# Patient Record
Sex: Female | Born: 1970
Health system: Southern US, Community
[De-identification: ages and names within clinical notes are randomized; demographics above are authoritative.]

## PROBLEM LIST (undated history)

## (undated) DIAGNOSIS — J45909 Unspecified asthma, uncomplicated: Secondary | ICD-10-CM

## (undated) DIAGNOSIS — K219 Gastro-esophageal reflux disease without esophagitis: Secondary | ICD-10-CM

## (undated) DIAGNOSIS — G2581 Restless legs syndrome: Secondary | ICD-10-CM

## (undated) DIAGNOSIS — G43909 Migraine, unspecified, not intractable, without status migrainosus: Secondary | ICD-10-CM

## (undated) DIAGNOSIS — F4310 Post-traumatic stress disorder, unspecified: Secondary | ICD-10-CM

## (undated) DIAGNOSIS — G629 Polyneuropathy, unspecified: Secondary | ICD-10-CM

## (undated) DIAGNOSIS — K589 Irritable bowel syndrome without diarrhea: Secondary | ICD-10-CM

## (undated) DIAGNOSIS — M47817 Spondylosis without myelopathy or radiculopathy, lumbosacral region: Secondary | ICD-10-CM

## (undated) DIAGNOSIS — G4710 Hypersomnia, unspecified: Secondary | ICD-10-CM

## (undated) DIAGNOSIS — R112 Nausea with vomiting, unspecified: Secondary | ICD-10-CM

## (undated) DIAGNOSIS — N3010 Interstitial cystitis (chronic) without hematuria: Secondary | ICD-10-CM

## (undated) DIAGNOSIS — E119 Type 2 diabetes mellitus without complications: Secondary | ICD-10-CM

## (undated) DIAGNOSIS — F419 Anxiety disorder, unspecified: Secondary | ICD-10-CM

## (undated) DIAGNOSIS — Z9889 Other specified postprocedural states: Secondary | ICD-10-CM

## (undated) DIAGNOSIS — F429 Obsessive-compulsive disorder, unspecified: Secondary | ICD-10-CM

## (undated) DIAGNOSIS — F32A Depression, unspecified: Secondary | ICD-10-CM

## (undated) DIAGNOSIS — F329 Major depressive disorder, single episode, unspecified: Secondary | ICD-10-CM

## (undated) DIAGNOSIS — M5030 Other cervical disc degeneration, unspecified cervical region: Secondary | ICD-10-CM

## (undated) DIAGNOSIS — M1990 Unspecified osteoarthritis, unspecified site: Secondary | ICD-10-CM

## (undated) DIAGNOSIS — G4730 Sleep apnea, unspecified: Secondary | ICD-10-CM

## (undated) DIAGNOSIS — G5600 Carpal tunnel syndrome, unspecified upper limb: Secondary | ICD-10-CM

## (undated) DIAGNOSIS — M797 Fibromyalgia: Secondary | ICD-10-CM

## (undated) DIAGNOSIS — M549 Dorsalgia, unspecified: Secondary | ICD-10-CM

## (undated) DIAGNOSIS — J449 Chronic obstructive pulmonary disease, unspecified: Secondary | ICD-10-CM

## (undated) HISTORY — PX: MOUTH SURGERY: SHX715

## (undated) HISTORY — DX: Type 2 diabetes mellitus without complications: E11.9

## (undated) HISTORY — PX: ABDOMINAL SURGERY: SHX537

## (undated) HISTORY — PX: OTHER SURGICAL HISTORY: SHX169

## (undated) HISTORY — PX: EXPLORATORY LAPAROTOMY: SUR591

## (undated) HISTORY — PX: ABDOMINAL HYSTERECTOMY: SHX81

---

## 1998-01-26 ENCOUNTER — Ambulatory Visit (HOSPITAL_COMMUNITY): Admission: RE | Admit: 1998-01-26 | Discharge: 1998-01-26 | Payer: Self-pay | Admitting: Unknown Physician Specialty

## 2009-08-10 ENCOUNTER — Emergency Department (HOSPITAL_COMMUNITY): Admission: EM | Admit: 2009-08-10 | Discharge: 2009-08-10 | Payer: Self-pay | Admitting: Emergency Medicine

## 2009-08-19 ENCOUNTER — Emergency Department (HOSPITAL_COMMUNITY): Admission: EM | Admit: 2009-08-19 | Discharge: 2009-08-19 | Payer: Self-pay | Admitting: Emergency Medicine

## 2011-10-11 ENCOUNTER — Ambulatory Visit: Payer: Self-pay | Admitting: Family Medicine

## 2011-10-20 ENCOUNTER — Encounter (HOSPITAL_COMMUNITY): Payer: Self-pay | Admitting: *Deleted

## 2011-10-20 ENCOUNTER — Emergency Department (HOSPITAL_COMMUNITY)
Admission: EM | Admit: 2011-10-20 | Discharge: 2011-10-20 | Disposition: A | Discharge status: Home / Self Care | Payer: Medicare Other | Attending: Emergency Medicine | Admitting: Emergency Medicine

## 2011-10-20 DIAGNOSIS — F17200 Nicotine dependence, unspecified, uncomplicated: Secondary | ICD-10-CM | POA: Insufficient documentation

## 2011-10-20 DIAGNOSIS — Z8739 Personal history of other diseases of the musculoskeletal system and connective tissue: Secondary | ICD-10-CM | POA: Insufficient documentation

## 2011-10-20 DIAGNOSIS — F341 Dysthymic disorder: Secondary | ICD-10-CM | POA: Insufficient documentation

## 2011-10-20 DIAGNOSIS — J449 Chronic obstructive pulmonary disease, unspecified: Secondary | ICD-10-CM | POA: Insufficient documentation

## 2011-10-20 DIAGNOSIS — M7989 Other specified soft tissue disorders: Secondary | ICD-10-CM | POA: Insufficient documentation

## 2011-10-20 DIAGNOSIS — J4489 Other specified chronic obstructive pulmonary disease: Secondary | ICD-10-CM | POA: Insufficient documentation

## 2011-10-20 DIAGNOSIS — IMO0001 Reserved for inherently not codable concepts without codable children: Secondary | ICD-10-CM | POA: Insufficient documentation

## 2011-10-20 HISTORY — DX: Fibromyalgia: M79.7

## 2011-10-20 HISTORY — DX: Obsessive-compulsive disorder, unspecified: F42.9

## 2011-10-20 HISTORY — DX: Unspecified osteoarthritis, unspecified site: M19.90

## 2011-10-20 HISTORY — DX: Post-traumatic stress disorder, unspecified: F43.10

## 2011-10-20 HISTORY — DX: Depression, unspecified: F32.A

## 2011-10-20 HISTORY — DX: Migraine, unspecified, not intractable, without status migrainosus: G43.909

## 2011-10-20 HISTORY — DX: Major depressive disorder, single episode, unspecified: F32.9

## 2011-10-20 HISTORY — DX: Chronic obstructive pulmonary disease, unspecified: J44.9

## 2011-10-20 HISTORY — DX: Unspecified asthma, uncomplicated: J45.909

## 2011-10-20 HISTORY — DX: Interstitial cystitis (chronic) without hematuria: N30.10

## 2011-10-20 HISTORY — DX: Anxiety disorder, unspecified: F41.9

## 2011-10-20 HISTORY — DX: Dorsalgia, unspecified: M54.9

## 2011-10-20 LAB — CBC WITH DIFFERENTIAL/PLATELET
Band Neutrophils: 0 % (ref 0–10)
Basophils Absolute: 0 10*3/uL (ref 0.0–0.1)
Eosinophils Absolute: 0.1 10*3/uL (ref 0.0–0.7)
Eosinophils Relative: 1 % (ref 0–5)
HCT: 37 % (ref 36.0–46.0)
Hemoglobin: 12.9 g/dL (ref 12.0–15.0)
Lymphocytes Relative: 38 % (ref 12–46)
Lymphs Abs: 2.8 10*3/uL (ref 0.7–4.0)
MCH: 32.2 pg (ref 26.0–34.0)
MCHC: 34.9 g/dL (ref 30.0–36.0)
Myelocytes: 0 %
Platelets: 190 10*3/uL (ref 150–400)
RDW: 12.6 % (ref 11.5–15.5)
WBC: 7.4 10*3/uL (ref 4.0–10.5)
nRBC: 0 /100 WBC

## 2011-10-20 LAB — BASIC METABOLIC PANEL
BUN: 12 mg/dL (ref 6–23)
CO2: 27 mEq/L (ref 19–32)
Chloride: 106 mEq/L (ref 96–112)
GFR calc Af Amer: 79 mL/min — ABNORMAL LOW (ref >90)
Sodium: 139 mEq/L (ref 135–145)

## 2011-10-20 MED ORDER — CLINDAMYCIN HCL 150 MG PO CAPS
300.0000 mg | ORAL_CAPSULE | Freq: Once | ORAL | Status: AC
  Administered 2011-10-20: 300 mg via ORAL
  Filled 2011-10-20: qty 2

## 2011-10-20 MED ORDER — ENOXAPARIN SODIUM 100 MG/ML ~~LOC~~ SOLN
1.0000 mg/kg | Freq: Once | SUBCUTANEOUS | Status: AC
  Administered 2011-10-20: 100 mg via SUBCUTANEOUS
  Filled 2011-10-20: qty 1

## 2011-10-20 MED ORDER — CLINDAMYCIN HCL 300 MG PO CAPS: 300.0000 mg | ORAL_CAPSULE | Freq: Four times a day (QID) | ORAL | Status: AC

## 2011-10-20 NOTE — ED Notes (Signed)
Pt c/o left leg swelling that has been going on a while. Pt was recently treated for cellulitis.

## 2011-10-20 NOTE — ED Provider Notes (Signed)
History   This chart was scribed for Alexis Melter, MD by Shari Heritage. The patient was seen in room APA14/APA14. Patient's care was started at 1713.     CSN: [id number]  Arrival date & time 10/20/11  1713   First MD Initiated Contact with Patient 10/20/11 1821      Chief Complaint  Patient presents with  . Leg Swelling    (Consider location/radiation/quality/duration/timing/severity/associated sxs/prior treatment) The history is provided by the patient. No language interpreter was used.   Alexis Harris is a 41 y.o. female who presents to the Emergency Department complaining of diffuse, left leg swelling onset 2 months ago. Associated symptoms include redness and pain in the left lower extremity. Patient says that swelling is worse at the end of the day and that she elevates her leg at the end of every day. Patient states that she has seen a physician for this issue. Patient was referred to [facility] ED on June 13th. Patient had an X-ray, blood work and Doppler of her left leg. Patient was diagnosed with cellulitis and enlarged lymph node in left upper leg. Patient was given antibiotics and was prescribed a course of Clindamycin which patient finished as scheduled in June. Patient states that swelling reduced after course of Clindamycin, but after 2 days, swelling began to gradually return.  Medical h/o fibromyalgia, COPD, asthma, chronic back pain, GERD, depression and anxiety. Patient with surgical h/o abdominal hysterectomy. [name] a current some day smoker.  Past Medical History  Diagnosis Date  . Fibromyalgia   . Asthma   . COPD (chronic obstructive pulmonary disease)   . Back pain   . Arthritis   . Interstitial cystitis   . Depression   . Anxiety   . PTSD (post-traumatic stress disorder)   . OCD (obsessive compulsive disorder)   . Migraine     Past Surgical History  Procedure Date  . Abdominal surgery   . Abdominal hysterectomy     History  reviewed. No pertinent family history.  History  Substance Use Topics  . Smoking status: Current Some Day Smoker  . Smokeless tobacco: Not on file  . Alcohol Use: No    OB History    Grav Para Term Preterm Abortions TAB SAB Ect Mult Living                  Review of Systems  Musculoskeletal:       Positive for left leg pain, left leg swelling.  Skin: Positive for color change (Redness of left lower extremity).  All other systems reviewed and are negative.    Allergies  Advair diskus; Codeine; and Penicillins  Home Medications    BP 127/79  Pulse 108  Temp 98.5 F (36.9 C) (Oral)  Resp 21  Ht 5\' 7"  (1.702 m)  Wt 224 lb (101.606 kg)  BMI 35.08 kg/m2  SpO2 100%  Physical Exam  Nursing note and vitals reviewed. Constitutional: She is oriented to person, place, and time. She appears well-developed and well-nourished. No distress.  HENT:  Head: Normocephalic and atraumatic.  Eyes: Conjunctivae and EOM are normal.  Neck: Neck supple. No tracheal deviation present.  Cardiovascular: Normal rate and regular rhythm.   Pulmonary/Chest: Effort normal. No respiratory distress. She has no wheezes. She has no rhonchi.  Abdominal: She exhibits no distension.  Musculoskeletal: Normal range of motion.       Left calf tenderness. No popliteal tenderness. Left leg is diffusely swollen. Patient experiencing pain  in lower left leg. Left leg is mildly red, not clearly demarcated. There are scattered excoriations on both lower extremities. No discharge, fluctuance or vesicles.   Neurological: She is alert and oriented to person, place, and time. No sensory deficit.  Skin: Skin is dry.  Psychiatric: She has a normal mood and affect. Her behavior is normal.    ED Course  Procedures (including critical care time) DIAGNOSTIC STUDIES: Oxygen Saturation is 100% on room air, normal by my interpretation.    COORDINATION OF CARE: 6:43PM- Patient informed of current plan for treatment and  evaluation and agrees with plan at this time. Will order CBC and metabolic panel. Will administer Clindamycin. If labs are normal, will administer Lovenox and discharge patient.  8:31PM- Updated patient on results of labs. Labs were unremarkable. Will administer Lovenox shot and prescribe a course of antibiotics. Patient should return in the morning for an [place]. Will also refer patient to a vein specialist and provide phone numbers for PCP.   Results for orders placed during the hospital encounter of 10/20/11  BASIC METABOLIC PANEL      Component Value Range   Sodium 139  135 - 145 mEq/L   Potassium 3.7  3.5 - 5.1 mEq/L   Chloride 106  96 - 112 mEq/L   CO2 27  19 - 32 mEq/L   Glucose, Bld 104 (*) 70 - 99 mg/dL   BUN 12  6 - 23 mg/dL   Creatinine, Ser 4.09  0.50 - 1.10 mg/dL   Calcium 9.4  8.4 - 81.1 mg/dL   GFR calc non Af Amer 68 (*) >90 mL/min   GFR calc Af Amer 79 (*) >90 mL/min  CBC WITH DIFFERENTIAL      Component Value Range   WBC 7.4  4.0 - 10.5 K/uL   RBC 4.01  3.87 - 5.11 MIL/uL   Hemoglobin 12.9  12.0 - 15.0 g/dL   HCT 91.4  78.2 - 95.6 %   MCV 92.3  78.0 - 100.0 fL   MCH 32.2  26.0 - 34.0 pg   MCHC 34.9  30.0 - 36.0 g/dL   RDW 21.3  08.6 - 57.8 %   Platelets 190  150 - 400 K/uL   Neutrophils Relative 59  43 - 77 %   Lymphocytes Relative 38  12 - 46 %   Monocytes Relative 2 (*) 3 - 12 %   Eosinophils Relative 1  0 - 5 %   Basophils Relative 0  0 - 1 %   Band Neutrophils 0  0 - 10 %   Metamyelocytes Relative 0     Myelocytes 0     Promyelocytes Absolute 0     Blasts 0     nRBC 0  0 /100 WBC   Neutro Abs 4.4  1.7 - 7.7 K/uL   Lymphs Abs 2.8  0.7 - 4.0 K/uL   Monocytes Absolute 0.1  0.1 - 1.0 K/uL   Eosinophils Absolute 0.1  0.0 - 0.7 K/uL   Basophils Absolute 0.0  0.0 - 0.1 K/uL  PROTIME-INR      Component Value Range   Prothrombin Time 14.7  11.6 - 15.2 seconds   INR 1.13  0.00 - 1.49   No results found.   1. Leg swelling       MDM  Nonspecific  recurrent leg swelling, with differential diagnosis including DVT and cellulitis. Doubt metabolic instability, serious bacterial infection or impending vascular collapse; the patient is stable for discharge.  Plan: Home Medications- Clindamycin; Home Treatments- Elevate feet; Recommended follow up- Vascular f/u if negative DVT for persistant swelling  I personally performed the services described in this documentation, which was scribed in my presence. The recorded information has been reviewed and considered.     Alexis Melter, MD 10/20/11 [phone]

## 2011-10-20 NOTE — ED Notes (Signed)
Swelling of lt  Leg for 2 mos, dx with cellulitis , says was getting better, then became worse.   Swelling and redness present

## 2011-10-21 ENCOUNTER — Ambulatory Visit (HOSPITAL_COMMUNITY)
Admit: 2011-10-21 | Discharge: 2011-10-21 | Disposition: A | Discharge status: Home / Self Care | Payer: Medicare Other | Source: Ambulatory Visit | Attending: Emergency Medicine | Admitting: Emergency Medicine

## 2011-10-21 DIAGNOSIS — R609 Edema, unspecified: Secondary | ICD-10-CM | POA: Insufficient documentation

## 2011-10-21 DIAGNOSIS — M79609 Pain in unspecified limb: Secondary | ICD-10-CM | POA: Insufficient documentation

## 2014-11-02 NOTE — Patient Instructions (Signed)
Your procedure is scheduled on: 11/10/2014  Report to [facility] at  800  AM.  Call this number if you have problems the morning of surgery: [phone]   Do not eat food or drink liquids :After Midnight.      Take these medicines the morning of surgery with A SIP OF WATER: xanax, wellbutrin, flexaril, enablex, luvox, hydrocodone, morphine, lyrica, imitrex, topamax. Take your inhalers before you come.   Do not wear jewelry, make-up or nail polish.  Do not wear lotions, powders, or perfumes.  Do not shave 48 hours prior to surgery.  Do not bring valuables to the hospital.  Contacts, dentures or bridgework may not be worn into surgery.  Leave suitcase in the car. After surgery it may be brought to your room.  For patients admitted to the hospital, checkout time is 11:00 AM the day of discharge.   Patients discharged the day of surgery will not be allowed to drive home.  :     Please read over the following fact sheets that you were given: Coughing and Deep Breathing, Surgical Site Infection Prevention, Anesthesia Post-op Instructions and Care and Recovery After Surgery    Cataract A cataract is a clouding of the lens of the eye. When a lens becomes cloudy, vision is reduced based on the degree and nature of the clouding. Many cataracts reduce vision to some degree. Some cataracts make people more near-sighted as they develop. Other cataracts increase glare. Cataracts that are ignored and become worse can sometimes look white. The white color can be seen through the pupil. CAUSES   Aging. However, cataracts may occur at any age, even in newborns.   Certain drugs.   Trauma to the eye.   Certain diseases such as diabetes.   Specific eye diseases such as chronic inflammation inside the eye or a sudden attack of a rare form of glaucoma.   Inherited or acquired medical problems.  SYMPTOMS   Gradual, progressive drop in vision in the affected eye.   Severe, rapid visual loss. This most  often happens when trauma is the cause.  DIAGNOSIS  To detect a cataract, an eye doctor examines the lens. Cataracts are best diagnosed with an exam of the eyes with the pupils enlarged (dilated) by drops.  TREATMENT  For an early cataract, vision may improve by using different eyeglasses or stronger lighting. If that does not help your vision, surgery is the only effective treatment. A cataract needs to be surgically removed when vision loss interferes with your everyday activities, such as driving, reading, or watching TV. A cataract may also have to be removed if it prevents examination or treatment of another eye problem. Surgery removes the cloudy lens and usually replaces it with a substitute lens (intraocular lens, IOL).  At a time when both you and your doctor agree, the cataract will be surgically removed. If you have cataracts in both eyes, only one is usually removed at a time. This allows the operated eye to heal and be out of danger from any possible problems after surgery (such as infection or poor wound healing). In rare cases, a cataract may be doing damage to your eye. In these cases, your caregiver may advise surgical removal right away. The vast majority of people who have cataract surgery have better vision afterward. HOME CARE INSTRUCTIONS  If you are not planning surgery, you may be asked to do the following:  Use different eyeglasses.   Use stronger or brighter lighting.  Ask your eye doctor about reducing your medicine dose or changing medicines if it is thought that a medicine caused your cataract. Changing medicines does not make the cataract go away on its own.   Become familiar with your surroundings. Poor vision can lead to injury. Avoid bumping into things on the affected side. You are at a higher risk for tripping or falling.   Exercise extreme care when driving or operating machinery.   Wear sunglasses if you are sensitive to bright light or experiencing problems  with glare.  SEEK IMMEDIATE MEDICAL CARE IF:   You have a worsening or sudden vision loss.   You notice redness, swelling, or increasing pain in the eye.   You have a fever.  Document Released: 03/20/2005 Document Revised: 03/09/2011 Document Reviewed: 11/11/2010 [facility] Patient Information 2012 [place].PATIENT INSTRUCTIONS POST-ANESTHESIA  IMMEDIATELY FOLLOWING SURGERY:  Do not drive or operate machinery for the first twenty four hours after surgery.  Do not make any important decisions for twenty four hours after surgery or while taking narcotic pain medications or sedatives.  If you develop intractable nausea and vomiting or a severe headache please notify your doctor immediately.  FOLLOW-UP:  Please make an appointment with your surgeon as instructed. You do not need to follow up with anesthesia unless specifically instructed to do so.  WOUND CARE INSTRUCTIONS (if applicable):  Keep a dry clean dressing on the anesthesia/puncture wound site if there is drainage.  Once the wound has quit draining you may leave it open to air.  Generally you should leave the bandage intact for twenty four hours unless there is drainage.  If the epidural site drains for more than 36-48 hours please call the anesthesia department.  QUESTIONS?:  Please feel free to call your physician or the hospital operator if you have any questions, and they will be happy to assist you.

## 2014-11-05 ENCOUNTER — Other Ambulatory Visit: Payer: Self-pay

## 2014-11-05 ENCOUNTER — Encounter (HOSPITAL_COMMUNITY): Payer: Self-pay

## 2014-11-05 ENCOUNTER — Encounter (HOSPITAL_COMMUNITY)
Admission: RE | Admit: 2014-11-05 | Discharge: 2014-11-05 | Disposition: A | Discharge status: Home / Self Care | Payer: Medicare Other | Source: Ambulatory Visit | Attending: Ophthalmology | Admitting: Ophthalmology

## 2014-11-05 DIAGNOSIS — Z01818 Encounter for other preprocedural examination: Secondary | ICD-10-CM | POA: Insufficient documentation

## 2014-11-05 DIAGNOSIS — H2511 Age-related nuclear cataract, right eye: Secondary | ICD-10-CM | POA: Diagnosis not present

## 2014-11-05 HISTORY — DX: Nausea with vomiting, unspecified: R11.2

## 2014-11-05 HISTORY — DX: Other specified postprocedural states: Z98.890

## 2014-11-05 HISTORY — DX: Restless legs syndrome: G25.81

## 2014-11-05 HISTORY — DX: Migraine, unspecified, not intractable, without status migrainosus: G43.909

## 2014-11-05 HISTORY — DX: Spondylosis without myelopathy or radiculopathy, lumbosacral region: M47.817

## 2014-11-05 HISTORY — DX: Irritable bowel syndrome, unspecified: K58.9

## 2014-11-05 HISTORY — DX: Hypersomnia, unspecified: G47.10

## 2014-11-05 LAB — BASIC METABOLIC PANEL
Anion gap: 6 (ref 5–15)
BUN: 20 mg/dL (ref 6–20)
CO2: 27 mmol/L (ref 22–32)
Calcium: 9.4 mg/dL (ref 8.9–10.3)
Chloride: 105 mmol/L (ref 101–111)
Creatinine, Ser: 1.11 mg/dL — ABNORMAL HIGH (ref 0.44–1.00)
GFR calc Af Amer: >60 mL/min (ref >60)
GFR, EST NON AFRICAN AMERICAN: 59 mL/min — AB (ref >60)
Glucose, Bld: 58 mg/dL — ABNORMAL LOW (ref 65–99)
Potassium: 4.4 mmol/L (ref 3.5–5.1)
Sodium: 138 mmol/L (ref 135–145)

## 2014-11-05 LAB — CBC WITH DIFFERENTIAL/PLATELET
Basophils Absolute: 0 10*3/uL (ref 0.0–0.1)
Basophils Relative: 1 % (ref 0–1)
Eosinophils Absolute: 0.2 10*3/uL (ref 0.0–0.7)
Eosinophils Relative: 4 % (ref 0–5)
HCT: 39 % (ref 36.0–46.0)
Hemoglobin: 13.3 g/dL (ref 12.0–15.0)
Lymphocytes Relative: 43 % (ref 12–46)
Lymphs Abs: 2.3 10*3/uL (ref 0.7–4.0)
MCH: 33.2 pg (ref 26.0–34.0)
MCHC: 34.1 g/dL (ref 30.0–36.0)
MCV: 97.3 fL (ref 78.0–100.0)
MONO ABS: 0.5 10*3/uL (ref 0.1–1.0)
MONOS PCT: 9 % (ref 3–12)
NEUTROS ABS: 2.2 10*3/uL (ref 1.7–7.7)
Neutrophils Relative %: 43 % (ref 43–77)
PLATELETS: 174 10*3/uL (ref 150–400)
RBC: 4.01 MIL/uL (ref 3.87–5.11)
RDW: 12.2 % (ref 11.5–15.5)
WBC: 5.2 10*3/uL (ref 4.0–10.5)

## 2014-11-05 NOTE — Pre-Procedure Instructions (Signed)
Info for MyChart given to pt. To be set up at home.

## 2014-11-09 MED ORDER — TETRACAINE HCL 0.5 % OP SOLN
OPHTHALMIC | Status: AC
  Filled 2014-11-09: qty 2

## 2014-11-09 MED ORDER — CYCLOPENTOLATE-PHENYLEPHRINE OP SOLN OPTIME - NO CHARGE
OPHTHALMIC | Status: AC
  Filled 2014-11-09: qty 2

## 2014-11-09 MED ORDER — KETOROLAC TROMETHAMINE 0.5 % OP SOLN
OPHTHALMIC | Status: AC
  Filled 2014-11-09: qty 5

## 2014-11-09 MED ORDER — PHENYLEPHRINE HCL 2.5 % OP SOLN
OPHTHALMIC | Status: AC
  Filled 2014-11-09: qty 15

## 2014-11-10 ENCOUNTER — Ambulatory Visit (HOSPITAL_COMMUNITY)
Admission: RE | Admit: 2014-11-10 | Discharge: 2014-11-10 | Disposition: A | Discharge status: Home / Self Care | Payer: Medicare Other | Source: Ambulatory Visit | Attending: Ophthalmology | Admitting: Ophthalmology

## 2014-11-10 ENCOUNTER — Ambulatory Visit (HOSPITAL_COMMUNITY): Payer: Medicare Other | Admitting: Anesthesiology

## 2014-11-10 ENCOUNTER — Encounter (HOSPITAL_COMMUNITY)
Admission: RE | Disposition: A | Discharge status: Home / Self Care | Payer: Self-pay | Source: Ambulatory Visit | Attending: Ophthalmology

## 2014-11-10 ENCOUNTER — Encounter (HOSPITAL_COMMUNITY): Payer: Self-pay | Admitting: *Deleted

## 2014-11-10 DIAGNOSIS — F17210 Nicotine dependence, cigarettes, uncomplicated: Secondary | ICD-10-CM | POA: Insufficient documentation

## 2014-11-10 DIAGNOSIS — H2511 Age-related nuclear cataract, right eye: Secondary | ICD-10-CM | POA: Insufficient documentation

## 2014-11-10 DIAGNOSIS — F42 Obsessive-compulsive disorder: Secondary | ICD-10-CM | POA: Insufficient documentation

## 2014-11-10 DIAGNOSIS — K219 Gastro-esophageal reflux disease without esophagitis: Secondary | ICD-10-CM | POA: Diagnosis not present

## 2014-11-10 DIAGNOSIS — F419 Anxiety disorder, unspecified: Secondary | ICD-10-CM | POA: Insufficient documentation

## 2014-11-10 DIAGNOSIS — M797 Fibromyalgia: Secondary | ICD-10-CM | POA: Insufficient documentation

## 2014-11-10 DIAGNOSIS — J45909 Unspecified asthma, uncomplicated: Secondary | ICD-10-CM | POA: Insufficient documentation

## 2014-11-10 DIAGNOSIS — F4310 Post-traumatic stress disorder, unspecified: Secondary | ICD-10-CM | POA: Insufficient documentation

## 2014-11-10 DIAGNOSIS — J449 Chronic obstructive pulmonary disease, unspecified: Secondary | ICD-10-CM | POA: Diagnosis not present

## 2014-11-10 DIAGNOSIS — Z79899 Other long term (current) drug therapy: Secondary | ICD-10-CM | POA: Diagnosis not present

## 2014-11-10 DIAGNOSIS — F329 Major depressive disorder, single episode, unspecified: Secondary | ICD-10-CM | POA: Insufficient documentation

## 2014-11-10 DIAGNOSIS — M1990 Unspecified osteoarthritis, unspecified site: Secondary | ICD-10-CM | POA: Diagnosis not present

## 2014-11-10 HISTORY — PX: CATARACT EXTRACTION W/PHACO: SHX586

## 2014-11-10 LAB — GLUCOSE, CAPILLARY: Glucose-Capillary: 98 mg/dL (ref 65–99)

## 2014-11-10 SURGERY — PHACOEMULSIFICATION, CATARACT, WITH IOL INSERTION
Anesthesia: Monitor Anesthesia Care | Site: Eye | Laterality: Right

## 2014-11-10 MED ORDER — TETRACAINE HCL 0.5 % OP SOLN
1.0000 [drp] | OPHTHALMIC | Status: AC
  Administered 2014-11-10 (×3): 1 [drp] via OPHTHALMIC

## 2014-11-10 MED ORDER — LACTATED RINGERS IV SOLN
INTRAVENOUS | Status: DC
  Administered 2014-11-10: 1000 mL via INTRAVENOUS

## 2014-11-10 MED ORDER — TETRACAINE 0.5 % OP SOLN OPTIME - NO CHARGE
OPHTHALMIC | Status: DC | PRN
  Administered 2014-11-10: 1 [drp] via OPHTHALMIC

## 2014-11-10 MED ORDER — EPINEPHRINE HCL 1 MG/ML IJ SOLN
INTRAMUSCULAR | Status: AC
  Filled 2014-11-10: qty 1

## 2014-11-10 MED ORDER — MIDAZOLAM HCL 2 MG/2ML IJ SOLN
1.0000 mg | INTRAMUSCULAR | Status: DC | PRN
  Administered 2014-11-10: 2 mg via INTRAVENOUS
  Filled 2014-11-10: qty 2

## 2014-11-10 MED ORDER — FENTANYL CITRATE (PF) 100 MCG/2ML IJ SOLN
25.0000 ug | INTRAMUSCULAR | Status: AC
  Administered 2014-11-10 (×2): 25 ug via INTRAVENOUS
  Filled 2014-11-10: qty 2

## 2014-11-10 MED ORDER — KETOROLAC TROMETHAMINE 0.5 % OP SOLN
1.0000 [drp] | OPHTHALMIC | Status: AC
  Administered 2014-11-10 (×3): 1 [drp] via OPHTHALMIC

## 2014-11-10 MED ORDER — PHENYLEPHRINE HCL 2.5 % OP SOLN
1.0000 [drp] | OPHTHALMIC | Status: AC
  Administered 2014-11-10 (×3): 1 [drp] via OPHTHALMIC

## 2014-11-10 MED ORDER — BSS IO SOLN
INTRAOCULAR | Status: DC | PRN
  Administered 2014-11-10: 15 mL via INTRAOCULAR

## 2014-11-10 MED ORDER — PROVISC 10 MG/ML IO SOLN
INTRAOCULAR | Status: DC | PRN
  Administered 2014-11-10: 0.85 mL via INTRAOCULAR

## 2014-11-10 MED ORDER — CYCLOPENTOLATE-PHENYLEPHRINE 0.2-1 % OP SOLN
1.0000 [drp] | OPHTHALMIC | Status: AC
  Administered 2014-11-10 (×3): 1 [drp] via OPHTHALMIC

## 2014-11-10 MED ORDER — EPINEPHRINE HCL 1 MG/ML IJ SOLN
INTRAOCULAR | Status: DC | PRN
  Administered 2014-11-10: 500 mL

## 2014-11-10 SURGICAL SUPPLY — 24 items
CAPSULAR TENSION RING-AMO (OPHTHALMIC RELATED) IMPLANT
CLOTH BEACON ORANGE TIMEOUT ST (SAFETY) ×2 IMPLANT
EYE SHIELD UNIVERSAL CLEAR (GAUZE/BANDAGES/DRESSINGS) ×2 IMPLANT
GLOVE BIO SURGEON STRL SZ 6.5 (GLOVE) ×2 IMPLANT
GLOVE ECLIPSE 6.5 STRL STRAW (GLOVE) IMPLANT
GLOVE ECLIPSE 7.0 STRL STRAW (GLOVE) IMPLANT
GLOVE EXAM NITRILE LRG STRL (GLOVE) IMPLANT
GLOVE EXAM NITRILE MD LF STRL (GLOVE) ×2 IMPLANT
GLOVE SKINSENSE NS SZ6.5 (GLOVE)
GLOVE SKINSENSE STRL SZ6.5 (GLOVE) IMPLANT
HEALON 5 0.6 ML (INTRAOCULAR LENS) IMPLANT
KIT VITRECTOMY (OPHTHALMIC RELATED) IMPLANT
LENS ALC ACRYL/TECN (Ophthalmic Related) ×2 IMPLANT
PAD ARMBOARD 7.5X6 YLW CONV (MISCELLANEOUS) ×2 IMPLANT
PROC W NO LENS (INTRAOCULAR LENS)
PROC W SPEC LENS (INTRAOCULAR LENS)
PROCESS W NO LENS (INTRAOCULAR LENS) IMPLANT
PROCESS W SPEC LENS (INTRAOCULAR LENS) IMPLANT
RETRACTOR IRIS SIGHTPATH (OPHTHALMIC RELATED) IMPLANT
RING MALYGIN (MISCELLANEOUS) IMPLANT
TAPE SURG TRANSPARENT 2IN (GAUZE/BANDAGES/DRESSINGS) ×1 IMPLANT
TAPE TRANSPARENT 2IN (GAUZE/BANDAGES/DRESSINGS) ×1
VISCOELASTIC ADDITIONAL (OPHTHALMIC RELATED) IMPLANT
WATER STERILE IRR 250ML POUR (IV SOLUTION) ×2 IMPLANT

## 2014-11-10 NOTE — Op Note (Signed)
Patient brought to the operating room and prepped and draped in the usual manner.  Lid speculum inserted in right eye.  Stab incision made at the twelve o'clock position.  Provisc instilled in the anterior chamber.   A 2.4 mm. Stab incision was made temporally.  An anterior capsulotomy was done with a bent 25 gauge needle.  The nucleus was hydrodissected.  The Phaco tip was inserted in the anterior chamber and the nucleus was emulsified.  CDE was 2.04.  The cortical material was then removed with the I and A tip.  Posterior capsule was the polished.  The anterior chamber was deepened with Provisc.  A 17.0 Diopter Alcon [id number] IOL was then inserted in the capsular bag.  Provisc was then removed with the I and A tip.  The wound was then hydrated.  Patient sent to the Recovery Room in good condition with follow up in my office.  Preoperative Diagnosis:  Nuclear Cataract OD Postoperative Diagnosis:  Same Procedure name: Kelman Phacoemulsification OD with IOL

## 2014-11-10 NOTE — Anesthesia Preprocedure Evaluation (Addendum)
Anesthesia Evaluation  Patient identified by MRN, date of birth, ID band Patient awake    Reviewed: Allergy & Precautions, NPO status , Patient's Chart, lab work & pertinent test results  History of Anesthesia Complications (+) PONV and history of anesthetic complications  Airway Mallampati: II  TM Distance: >3 FB     Dental  (+) Edentulous Upper, Edentulous Lower   Pulmonary asthma , COPDCurrent Smoker,  breath sounds clear to auscultation        Cardiovascular negative cardio ROS  Rhythm:Regular Rate:Normal     Neuro/Psych  Headaches, PSYCHIATRIC DISORDERS (PTSD, OCD) Anxiety Depression    GI/Hepatic   Endo/Other    Renal/GU      Musculoskeletal  (+) Arthritis -, Fibromyalgia -  Abdominal   Peds  Hematology   Anesthesia Other Findings   Reproductive/Obstetrics                            Anesthesia Physical Anesthesia Plan  ASA: III  Anesthesia Plan: MAC   Post-op Pain Management:    Induction: Intravenous  Airway Management Planned: Nasal Cannula  Additional Equipment:   Intra-op Plan:   Post-operative Plan:   Informed Consent: I have reviewed the patients History and Physical, chart, labs and discussed the procedure including the risks, benefits and alternatives for the proposed anesthesia with the patient or authorized representative who has indicated his/her understanding and acceptance.     Plan Discussed with:   Anesthesia Plan Comments:         Anesthesia Quick Evaluation

## 2014-11-10 NOTE — Discharge Instructions (Signed)
Alexis Harris  11/10/2014           [facility] Instructions [place] [address]      1. Avoid closing eyes tightly. One often closes the eye tightly when laughing, talking, sneezing, coughing or if they feel irritated. At these times, you should be careful not to close your eyes tightly.  2. Instill eye drops as instructed. To instill drops in your eye, open it, look up and have someone gently pull the lower lid down and instill a couple of drops inside the lower lid.  3. Do not touch upper lid.  4. Take Advil or Tylenol for pain.  5. You may use either eye for near work, such as reading or sewing and you may watch television.  6. You may have your hair done at the beauty parlor at any time.  7. Wear dark glasses with or without your own glasses if you are in bright light.  8. Call our office at [phone] or [phone] if you have sharp pain in your eye or unusual symptoms.  9. Do not be concerned because vision in the operative eye is not good. It will not be good, no matter how successful the operation, until you get a special lens for it. Your old glasses will not be suited to the new eye that was operated on and you will not be ready for a new lens for about a month.  10. Follow up at the [facility] office.    I have received a copy of the above instructions and will follow them.

## 2014-11-10 NOTE — Anesthesia Procedure Notes (Signed)
Procedure Name: MAC Date/Time: 11/10/2014 9:19 AM Performed by: Vista Deck Pre-anesthesia Checklist: Patient identified, Emergency Drugs available, Suction available, Timeout performed and Patient being monitored Patient Re-evaluated:Patient Re-evaluated prior to inductionOxygen Delivery Method: Nasal Cannula

## 2014-11-10 NOTE — H&P (Signed)
The patient was re examined and there is no change in the patients condition since the original H and P. 

## 2014-11-10 NOTE — Transfer of Care (Signed)
Immediate Anesthesia Transfer of Care Note  Patient: Alexis Harris  Procedure(s) Performed: Procedure(s) (LRB): CATARACT EXTRACTION PHACO AND INTRAOCULAR LENS PLACEMENT (IOC) (Right)  Patient Location: Shortstay  Anesthesia Type: MAC  Level of Consciousness: awake  Airway & Oxygen Therapy: Patient Spontanous Breathing   Post-op Assessment: Report given to PACU RN, Post -op Vital signs reviewed and stable and Patient moving all extremities  Post vital signs: Reviewed and stable  Complications: No apparent anesthesia complications

## 2014-11-10 NOTE — Anesthesia Postprocedure Evaluation (Signed)
  Anesthesia Post-op Note  Patient: Alexis Harris  Procedure(s) Performed: Procedure(s) (LRB): CATARACT EXTRACTION PHACO AND INTRAOCULAR LENS PLACEMENT (IOC) (Right)  Patient Location:  Short Stay  Anesthesia Type: MAC  Level of Consciousness: awake  Airway and Oxygen Therapy: Patient Spontanous Breathing  Post-op Pain: none  Post-op Assessment: Post-op Vital signs reviewed, Patient's Cardiovascular Status Stable, Respiratory Function Stable, Patent Airway, No signs of Nausea or vomiting and Pain level controlled  Post-op Vital Signs: Reviewed and stable  Complications: No apparent anesthesia complications

## 2014-11-11 ENCOUNTER — Encounter (HOSPITAL_COMMUNITY): Payer: Self-pay | Admitting: Ophthalmology

## 2014-11-19 ENCOUNTER — Encounter (HOSPITAL_COMMUNITY)
Admission: RE | Admit: 2014-11-19 | Discharge: 2014-11-19 | Disposition: A | Discharge status: Home / Self Care | Payer: Medicare Other | Source: Ambulatory Visit | Attending: Ophthalmology | Admitting: Ophthalmology

## 2014-11-19 ENCOUNTER — Other Ambulatory Visit (HOSPITAL_COMMUNITY): Payer: Self-pay

## 2014-11-23 MED ORDER — CYCLOPENTOLATE-PHENYLEPHRINE OP SOLN OPTIME - NO CHARGE
OPHTHALMIC | Status: AC
  Filled 2014-11-23: qty 2

## 2014-11-23 MED ORDER — TETRACAINE HCL 0.5 % OP SOLN
OPHTHALMIC | Status: AC
  Filled 2014-11-23: qty 2

## 2014-11-23 MED ORDER — PHENYLEPHRINE HCL 2.5 % OP SOLN
OPHTHALMIC | Status: AC
  Filled 2014-11-23: qty 15

## 2014-11-23 MED ORDER — KETOROLAC TROMETHAMINE 0.5 % OP SOLN
OPHTHALMIC | Status: AC
  Filled 2014-11-23: qty 5

## 2014-11-24 ENCOUNTER — Ambulatory Visit (HOSPITAL_COMMUNITY): Payer: Medicare Other | Admitting: Anesthesiology

## 2014-11-24 ENCOUNTER — Encounter (HOSPITAL_COMMUNITY)
Admission: RE | Disposition: A | Discharge status: Home / Self Care | Payer: Self-pay | Source: Ambulatory Visit | Attending: Ophthalmology

## 2014-11-24 ENCOUNTER — Ambulatory Visit (HOSPITAL_COMMUNITY)
Admission: RE | Admit: 2014-11-24 | Discharge: 2014-11-24 | Disposition: A | Discharge status: Home / Self Care | Payer: Medicare Other | Source: Ambulatory Visit | Attending: Ophthalmology | Admitting: Ophthalmology

## 2014-11-24 ENCOUNTER — Encounter (HOSPITAL_COMMUNITY): Payer: Self-pay | Admitting: *Deleted

## 2014-11-24 DIAGNOSIS — M1990 Unspecified osteoarthritis, unspecified site: Secondary | ICD-10-CM | POA: Diagnosis not present

## 2014-11-24 DIAGNOSIS — H2512 Age-related nuclear cataract, left eye: Secondary | ICD-10-CM | POA: Diagnosis present

## 2014-11-24 DIAGNOSIS — F17200 Nicotine dependence, unspecified, uncomplicated: Secondary | ICD-10-CM | POA: Insufficient documentation

## 2014-11-24 DIAGNOSIS — Z79899 Other long term (current) drug therapy: Secondary | ICD-10-CM | POA: Diagnosis not present

## 2014-11-24 DIAGNOSIS — F418 Other specified anxiety disorders: Secondary | ICD-10-CM | POA: Insufficient documentation

## 2014-11-24 DIAGNOSIS — J449 Chronic obstructive pulmonary disease, unspecified: Secondary | ICD-10-CM | POA: Insufficient documentation

## 2014-11-24 HISTORY — PX: CATARACT EXTRACTION W/PHACO: SHX586

## 2014-11-24 SURGERY — PHACOEMULSIFICATION, CATARACT, WITH IOL INSERTION
Anesthesia: Monitor Anesthesia Care | Site: Eye | Laterality: Left

## 2014-11-24 MED ORDER — FENTANYL CITRATE (PF) 100 MCG/2ML IJ SOLN
INTRAMUSCULAR | Status: AC
  Filled 2014-11-24: qty 2

## 2014-11-24 MED ORDER — KETOROLAC TROMETHAMINE 0.5 % OP SOLN
1.0000 [drp] | OPHTHALMIC | Status: AC
  Administered 2014-11-24 (×3): 1 [drp] via OPHTHALMIC

## 2014-11-24 MED ORDER — MIDAZOLAM HCL 2 MG/2ML IJ SOLN
1.0000 mg | INTRAMUSCULAR | Status: DC | PRN
  Administered 2014-11-24 (×2): 2 mg via INTRAVENOUS

## 2014-11-24 MED ORDER — TETRACAINE HCL 0.5 % OP SOLN
1.0000 [drp] | OPHTHALMIC | Status: AC
  Administered 2014-11-24 (×3): 1 [drp] via OPHTHALMIC

## 2014-11-24 MED ORDER — EPINEPHRINE HCL 1 MG/ML IJ SOLN
INTRAOCULAR | Status: DC | PRN
  Administered 2014-11-24: 500 mL

## 2014-11-24 MED ORDER — MIDAZOLAM HCL 2 MG/2ML IJ SOLN
INTRAMUSCULAR | Status: AC
  Filled 2014-11-24: qty 2

## 2014-11-24 MED ORDER — PROVISC 10 MG/ML IO SOLN
INTRAOCULAR | Status: DC | PRN
  Administered 2014-11-24: 0.85 mL via INTRAOCULAR

## 2014-11-24 MED ORDER — FENTANYL CITRATE (PF) 100 MCG/2ML IJ SOLN
25.0000 ug | INTRAMUSCULAR | Status: AC
  Administered 2014-11-24 (×2): 25 ug via INTRAVENOUS

## 2014-11-24 MED ORDER — CYCLOPENTOLATE-PHENYLEPHRINE 0.2-1 % OP SOLN
1.0000 [drp] | OPHTHALMIC | Status: AC
  Administered 2014-11-24 (×3): 1 [drp] via OPHTHALMIC

## 2014-11-24 MED ORDER — LACTATED RINGERS IV SOLN
INTRAVENOUS | Status: DC
  Administered 2014-11-24: 08:00:00 via INTRAVENOUS

## 2014-11-24 MED ORDER — BSS IO SOLN
INTRAOCULAR | Status: DC | PRN
  Administered 2014-11-24: 15 mL

## 2014-11-24 MED ORDER — EPINEPHRINE HCL 1 MG/ML IJ SOLN
INTRAMUSCULAR | Status: AC
  Filled 2014-11-24: qty 1

## 2014-11-24 MED ORDER — TETRACAINE 0.5 % OP SOLN OPTIME - NO CHARGE
OPHTHALMIC | Status: DC | PRN
  Administered 2014-11-24: 1 [drp] via OPHTHALMIC

## 2014-11-24 MED ORDER — PHENYLEPHRINE HCL 2.5 % OP SOLN
1.0000 [drp] | OPHTHALMIC | Status: AC
  Administered 2014-11-24 (×3): 1 [drp] via OPHTHALMIC

## 2014-11-24 SURGICAL SUPPLY — 9 items
CLOTH BEACON ORANGE TIMEOUT ST (SAFETY) ×2 IMPLANT
EYE SHIELD UNIVERSAL CLEAR (GAUZE/BANDAGES/DRESSINGS) ×2 IMPLANT
GLOVE BIO SURGEON STRL SZ 6.5 (GLOVE) ×2 IMPLANT
GLOVE EXAM NITRILE MD LF STRL (GLOVE) ×2 IMPLANT
LENS ALC ACRYL/TECN (Ophthalmic Related) ×2 IMPLANT
PAD ARMBOARD 7.5X6 YLW CONV (MISCELLANEOUS) ×2 IMPLANT
TAPE SURG TRANSPORE 1 IN (GAUZE/BANDAGES/DRESSINGS) ×1 IMPLANT
TAPE SURGICAL TRANSPORE 1 IN (GAUZE/BANDAGES/DRESSINGS) ×1
WATER STERILE IRR 250ML POUR (IV SOLUTION) ×2 IMPLANT

## 2014-11-24 NOTE — Anesthesia Procedure Notes (Signed)
Procedure Name: MAC Date/Time: 11/24/2014 8:42 AM Performed by: Vista Deck Pre-anesthesia Checklist: Patient identified, Emergency Drugs available, Suction available, Timeout performed and Patient being monitored Patient Re-evaluated:Patient Re-evaluated prior to inductionOxygen Delivery Method: Nasal Cannula

## 2014-11-24 NOTE — Transfer of Care (Signed)
Immediate Anesthesia Transfer of Care Note  Patient: Alexis Harris  Procedure(s) Performed: Procedure(s) (LRB): CATARACT EXTRACTION PHACO AND INTRAOCULAR LENS PLACEMENT (IOC) (Left)  Patient Location: Shortstay  Anesthesia Type: MAC  Level of Consciousness: awake  Airway & Oxygen Therapy: Patient Spontanous Breathing   Post-op Assessment: Report given to PACU RN, Post -op Vital signs reviewed and stable and Patient moving all extremities  Post vital signs: Reviewed and stable  Complications: No apparent anesthesia complications

## 2014-11-24 NOTE — Anesthesia Postprocedure Evaluation (Signed)
  Anesthesia Post-op Note  Patient: Alexis Harris  Procedure(s) Performed: Procedure(s) (LRB): CATARACT EXTRACTION PHACO AND INTRAOCULAR LENS PLACEMENT (IOC) (Left)  Patient Location:  Short Stay  Anesthesia Type: MAC  Level of Consciousness: awake  Airway and Oxygen Therapy: Patient Spontanous Breathing  Post-op Pain: none  Post-op Assessment: Post-op Vital signs reviewed, Patient's Cardiovascular Status Stable, Respiratory Function Stable, Patent Airway, No signs of Nausea or vomiting and Pain level controlled  Post-op Vital Signs: Reviewed and stable  Complications: No apparent anesthesia complications

## 2014-11-24 NOTE — Anesthesia Preprocedure Evaluation (Signed)
Anesthesia Evaluation  Patient identified by MRN, date of birth, ID band Patient awake    Reviewed: Allergy & Precautions, NPO status , Patient's Chart, lab work & pertinent test results  History of Anesthesia Complications (+) PONV and history of anesthetic complications  Airway Mallampati: II  TM Distance: >3 FB     Dental  (+) Edentulous Upper, Edentulous Lower   Pulmonary asthma , COPDCurrent Smoker,  breath sounds clear to auscultation        Cardiovascular negative cardio ROS  Rhythm:Regular Rate:Normal     Neuro/Psych  Headaches, PSYCHIATRIC DISORDERS (PTSD, OCD) Anxiety Depression    GI/Hepatic   Endo/Other    Renal/GU      Musculoskeletal  (+) Arthritis -, Fibromyalgia -  Abdominal   Peds  Hematology   Anesthesia Other Findings   Reproductive/Obstetrics                             Anesthesia Physical Anesthesia Plan  ASA: III  Anesthesia Plan: MAC   Post-op Pain Management:    Induction: Intravenous  Airway Management Planned: Nasal Cannula  Additional Equipment:   Intra-op Plan:   Post-operative Plan:   Informed Consent: I have reviewed the patients History and Physical, chart, labs and discussed the procedure including the risks, benefits and alternatives for the proposed anesthesia with the patient or authorized representative who has indicated his/her understanding and acceptance.     Plan Discussed with:   Anesthesia Plan Comments:         Anesthesia Quick Evaluation

## 2014-11-24 NOTE — Discharge Instructions (Signed)
Cataract Surgery °Care After °Refer to this sheet in the next few weeks. These instructions provide you with information on caring for yourself after your procedure. Your caregiver may also give you more specific instructions. Your treatment has been planned according to current medical practices, but problems sometimes occur. Call your caregiver if you have any problems or questions after your procedure.  °HOME CARE INSTRUCTIONS  °· Avoid strenuous activities as directed by your caregiver. °· Ask your caregiver when you can resume driving. °· Use eyedrops or other medicines to help healing and control pressure inside your eye as directed by your caregiver. °· Only take over-the-counter or prescription medicines for pain, discomfort, or fever as directed by your caregiver. °· Do not to touch or rub your eyes. °· You may be instructed to use a protective shield during the first few days and nights after surgery. If not, wear sunglasses to protect your eyes. This is to protect the eye from pressure or from being accidentally bumped. °· Keep the area around your eye clean and dry. Avoid swimming or allowing water to hit you directly in the face while showering. Keep soap and shampoo out of your eyes. °· Do not bend or lift heavy objects. Bending increases pressure in the eye. You can walk, climb stairs, and do light household chores. °· Do not put a contact lens into the eye that had surgery until your caregiver says it is okay to do so. °· Ask your doctor when you can return to work. This will depend on the kind of work that you do. If you work in a dusty environment, you may be advised to wear protective eyewear for a period of time. °· Ask your caregiver when it will be safe to engage in sexual activity. °· Continue with your regular eye exams as directed by your caregiver. °What to expect: °· It is normal to feel itching and mild discomfort for a few days after cataract surgery. Some fluid discharge is also common,  and your eye may be sensitive to light and touch. °· After 1 to 2 days, even moderate discomfort should disappear. In most cases, healing will take about 6 weeks. °· If you received an intraocular lens (IOL), you may notice that colors are very bright or have a blue tinge. Also, if you have been in bright sunlight, everything may appear reddish for a few hours. If you see these color tinges, it is because your lens is clear and no longer cloudy. Within a few months after receiving an IOL, these extra colors should go away. When you have healed, you will probably need new glasses. °SEEK MEDICAL CARE IF:  °· You have increased bruising around your eye. °· You have discomfort not helped by medicine. °SEEK IMMEDIATE MEDICAL CARE IF:  °· You have a  fever. °· You have a worsening or sudden vision loss. °· You have redness, swelling, or increasing pain in the eye. °· You have a thick discharge from the eye that had surgery. °MAKE SURE YOU: °· Understand these instructions. °· Will watch your condition. °· Will get help right away if you are not doing well or get worse. °Document Released: 10/07/2004 Document Revised: 06/12/2011 Document Reviewed: 11/11/2010 °[employer]® Patient Information ©2015 [employer]. This information is not intended to replace advice given to you by your health care provider. Make sure you discuss any questions you have with your health care provider. ° °

## 2014-11-24 NOTE — H&P (Signed)
The patient was re examined and there is no change in the patients condition since the original H and P. 

## 2014-11-24 NOTE — Op Note (Signed)

## 2014-11-25 ENCOUNTER — Encounter (HOSPITAL_COMMUNITY): Payer: Self-pay | Admitting: Ophthalmology

## 2015-02-19 ENCOUNTER — Emergency Department (HOSPITAL_COMMUNITY)
Admission: EM | Admit: 2015-02-19 | Discharge: 2015-02-19 | Disposition: A | Discharge status: Home / Self Care | Payer: Medicare Other | Attending: Emergency Medicine | Admitting: Emergency Medicine

## 2015-02-19 ENCOUNTER — Encounter (HOSPITAL_COMMUNITY): Payer: Self-pay

## 2015-02-19 ENCOUNTER — Emergency Department (HOSPITAL_COMMUNITY): Payer: Medicare Other

## 2015-02-19 DIAGNOSIS — M7989 Other specified soft tissue disorders: Secondary | ICD-10-CM | POA: Diagnosis present

## 2015-02-19 DIAGNOSIS — Z8669 Personal history of other diseases of the nervous system and sense organs: Secondary | ICD-10-CM | POA: Diagnosis not present

## 2015-02-19 DIAGNOSIS — Z8719 Personal history of other diseases of the digestive system: Secondary | ICD-10-CM | POA: Diagnosis not present

## 2015-02-19 DIAGNOSIS — M79605 Pain in left leg: Secondary | ICD-10-CM | POA: Diagnosis not present

## 2015-02-19 DIAGNOSIS — F17200 Nicotine dependence, unspecified, uncomplicated: Secondary | ICD-10-CM | POA: Insufficient documentation

## 2015-02-19 DIAGNOSIS — J449 Chronic obstructive pulmonary disease, unspecified: Secondary | ICD-10-CM | POA: Diagnosis not present

## 2015-02-19 DIAGNOSIS — Z87448 Personal history of other diseases of urinary system: Secondary | ICD-10-CM | POA: Diagnosis not present

## 2015-02-19 DIAGNOSIS — Z8679 Personal history of other diseases of the circulatory system: Secondary | ICD-10-CM | POA: Diagnosis not present

## 2015-02-19 DIAGNOSIS — Z8659 Personal history of other mental and behavioral disorders: Secondary | ICD-10-CM | POA: Diagnosis not present

## 2015-02-19 DIAGNOSIS — M25552 Pain in left hip: Secondary | ICD-10-CM

## 2015-02-19 DIAGNOSIS — Z880 Allergy status to penicillin: Secondary | ICD-10-CM | POA: Diagnosis not present

## 2015-02-19 DIAGNOSIS — R600 Localized edema: Secondary | ICD-10-CM | POA: Diagnosis not present

## 2015-02-19 LAB — COMPREHENSIVE METABOLIC PANEL
ALK PHOS: 48 U/L (ref 38–126)
ALT: 21 U/L (ref 14–54)
ANION GAP: 6 (ref 5–15)
AST: 23 U/L (ref 15–41)
Albumin: 3.9 g/dL (ref 3.5–5.0)
BUN: 16 mg/dL (ref 6–20)
CALCIUM: 9.1 mg/dL (ref 8.9–10.3)
CO2: 24 mmol/L (ref 22–32)
CREATININE: 1.02 mg/dL — AB (ref 0.44–1.00)
Chloride: 110 mmol/L (ref 101–111)
Glucose, Bld: 117 mg/dL — ABNORMAL HIGH (ref 65–99)
Potassium: 4.4 mmol/L (ref 3.5–5.1)
SODIUM: 140 mmol/L (ref 135–145)
Total Bilirubin: 0.4 mg/dL (ref 0.3–1.2)
Total Protein: 6.7 g/dL (ref 6.5–8.1)

## 2015-02-19 LAB — CBC WITH DIFFERENTIAL/PLATELET
Basophils Absolute: 0 10*3/uL (ref 0.0–0.1)
Basophils Relative: 1 %
EOS ABS: 0.1 10*3/uL (ref 0.0–0.7)
EOS PCT: 2 %
HCT: 38.1 % (ref 36.0–46.0)
HEMOGLOBIN: 13.2 g/dL (ref 12.0–15.0)
LYMPHS ABS: 1.9 10*3/uL (ref 0.7–4.0)
LYMPHS PCT: 36 %
MCH: 33.8 pg (ref 26.0–34.0)
MCHC: 34.6 g/dL (ref 30.0–36.0)
MCV: 97.4 fL (ref 78.0–100.0)
MONOS PCT: 7 %
Monocytes Absolute: 0.4 10*3/uL (ref 0.1–1.0)
Neutro Abs: 2.9 10*3/uL (ref 1.7–7.7)
Neutrophils Relative %: 54 %
PLATELETS: 166 10*3/uL (ref 150–400)
RBC: 3.91 MIL/uL (ref 3.87–5.11)
RDW: 13 % (ref 11.5–15.5)
WBC: 5.4 10*3/uL (ref 4.0–10.5)

## 2015-02-19 MED ORDER — SULFAMETHOXAZOLE-TRIMETHOPRIM 800-160 MG PO TABS: 1.0000 | ORAL_TABLET | Freq: Two times a day (BID) | ORAL | Status: AC

## 2015-02-19 NOTE — ED Provider Notes (Signed)
CSN: [id number]     Arrival date & time 02/19/15  1642 History   First MD Initiated Contact with Patient 02/19/15 1653     Chief Complaint  Patient presents with  . Cellulitis     (Consider location/radiation/quality/duration/timing/severity/associated sxs/prior Treatment) [name] a 44 y.o. female presenting with leg pain. The history is provided by the patient (The patient complains of left leg swelling and pain for a number weeks. She has had infection in this leg before).  Leg Pain Lower extremity pain location: Left leg. Injury: no   Pain details:    Quality:  Aching   Radiates to:  Does not radiate   Severity:  Mild   Onset quality:  Gradual   Timing:  Constant Relieved by:  None tried Associated symptoms: no back pain and no fatigue     Past Medical History  Diagnosis Date  . Fibromyalgia   . Asthma   . COPD (chronic obstructive pulmonary disease) ([place])   . Back pain   . Arthritis   . Interstitial cystitis   . Depression   . Anxiety   . PTSD (post-traumatic stress disorder)   . OCD (obsessive compulsive disorder)   . Migraine   . DJD (degenerative joint disease), lumbosacral   . Hypersomnia   . Migraines   . IBS (irritable bowel syndrome)   . Restless leg syndrome   . PONV (postoperative nausea and vomiting)    Past Surgical History  Procedure Laterality Date  . Abdominal surgery    . Abdominal hysterectomy    . Exploratory laparotomy      x3  . Mouth surgery    . Interstim medical device implanted      in lower back-to control bladder  . Cataract extraction w/phaco Right 11/10/2014    Procedure: CATARACT EXTRACTION PHACO AND INTRAOCULAR LENS PLACEMENT (IOC);  Surgeon: Rutherford Guys, MD;  Location: [facility];  Service: Ophthalmology;  Laterality: Right;  CDE 2.04  . Cataract extraction w/phaco Left 11/24/2014    Procedure: CATARACT EXTRACTION PHACO AND INTRAOCULAR LENS PLACEMENT (IOC);  Surgeon: Rutherford Guys, MD;  Location: [facility];  Service: Ophthalmology;   Laterality: Left;  CDE:1.04   No family history on file. Social History  Substance Use Topics  . Smoking status: Current Every Day Smoker -- 1.00 packs/day for 25 years  . Smokeless tobacco: None  . Alcohol Use: No   OB History    No data available     Review of Systems  Constitutional: Negative for appetite change and fatigue.  HENT: Negative for congestion, ear discharge and sinus pressure.   Eyes: Negative for discharge.  Respiratory: Negative for cough.   Cardiovascular: Negative for chest pain.  Gastrointestinal: Negative for abdominal pain and diarrhea.  Genitourinary: Negative for frequency and hematuria.  Musculoskeletal: Negative for back pain.  Skin: Negative for rash.  Neurological: Negative for seizures and headaches.  Psychiatric/Behavioral: Negative for hallucinations.      Allergies  Advair diskus; Codeine; and Penicillins  Home Medications    BP 108/79 mmHg  Pulse 72  Temp(Src) 98.3 F (36.8 C) (Oral)  Resp 14  Ht 5\' 7"  (1.702 m)  Wt 151 lb (68.493 kg)  BMI 23.64 kg/m2  SpO2 99% Physical Exam  Constitutional: She is oriented to person, place, and time. She appears well-developed.  HENT:  Head: Normocephalic.  Eyes: Conjunctivae and EOM are normal. No scleral icterus.  Neck: Neck supple. No thyromegaly present.  Cardiovascular: Normal rate and regular rhythm.  Exam reveals  no gallop and no friction rub.   No murmur heard. Pulmonary/Chest: No stridor. She has no wheezes. She has no rales. She exhibits no tenderness.  Abdominal: She exhibits no distension. There is no tenderness. There is no rebound.  Musculoskeletal: Normal range of motion. She exhibits edema.  Swelling tenderness mild  redness to left leg  Lymphadenopathy:    She has no cervical adenopathy.  Neurological: She is oriented to person, place, and time. She exhibits normal muscle tone. Coordination normal.  Skin: No rash noted. No erythema.  Psychiatric: She has a normal mood and  affect. Her behavior is normal.    ED Course  Procedures (including critical care time) Labs Review Labs Reviewed  COMPREHENSIVE METABOLIC PANEL - Abnormal; Notable for the following:    Glucose, Bld 117 (*)    Creatinine, Ser 1.02 (*)    All other components within normal limits  CBC WITH DIFFERENTIAL/PLATELET    Imaging Review No results found. I have personally reviewed and evaluated these images and lab results as part of my medical decision-making.   EKG Interpretation None      MDM   Final diagnoses:  Localized edema    Patient had ultrasound left leg that did not show any DVT. Unsure what causing the swelling tenderness possible cellulitis will treat with Bactrim and have her follow-up with her PCP. labs unremarkable    Milton Ferguson, MD 02/19/15 2138

## 2015-02-19 NOTE — ED Notes (Signed)
Pt reports history of cellulitis in legs and reports swelling and redness to lower legs.  Left worse than R.

## 2015-02-19 NOTE — Discharge Instructions (Signed)
Follow up with your md next week. °

## 2015-09-02 HISTORY — PX: ESOPHAGOGASTRODUODENOSCOPY: SHX1529

## 2018-06-27 ENCOUNTER — Other Ambulatory Visit (HOSPITAL_COMMUNITY): Payer: Self-pay | Admitting: Internal Medicine

## 2018-06-27 DIAGNOSIS — N644 Mastodynia: Secondary | ICD-10-CM

## 2018-07-01 ENCOUNTER — Other Ambulatory Visit (HOSPITAL_COMMUNITY): Payer: Self-pay | Admitting: Internal Medicine

## 2018-07-01 DIAGNOSIS — N644 Mastodynia: Secondary | ICD-10-CM

## 2018-07-02 ENCOUNTER — Other Ambulatory Visit: Payer: Self-pay

## 2018-07-02 ENCOUNTER — Ambulatory Visit (HOSPITAL_COMMUNITY): Payer: Medicare Other

## 2018-07-02 ENCOUNTER — Ambulatory Visit (HOSPITAL_COMMUNITY)
Admission: RE | Admit: 2018-07-02 | Discharge: 2018-07-02 | Disposition: A | Discharge status: Home / Self Care | Payer: Medicare Other | Source: Ambulatory Visit | Attending: Internal Medicine | Admitting: Internal Medicine

## 2018-07-02 DIAGNOSIS — N644 Mastodynia: Secondary | ICD-10-CM

## 2018-11-18 ENCOUNTER — Other Ambulatory Visit: Payer: Self-pay

## 2018-11-18 DIAGNOSIS — Z20822 Contact with and (suspected) exposure to covid-19: Secondary | ICD-10-CM

## 2018-11-20 LAB — NOVEL CORONAVIRUS, NAA: SARS-CoV-2, NAA: NOT DETECTED

## 2018-11-27 ENCOUNTER — Ambulatory Visit (HOSPITAL_COMMUNITY): Payer: Medicare Other

## 2018-11-27 ENCOUNTER — Encounter (HOSPITAL_COMMUNITY): Payer: Self-pay

## 2018-11-28 ENCOUNTER — Ambulatory Visit (HOSPITAL_COMMUNITY): Payer: Medicare Other

## 2018-12-04 ENCOUNTER — Encounter (HOSPITAL_COMMUNITY): Payer: Medicare Other

## 2018-12-06 ENCOUNTER — Encounter (HOSPITAL_COMMUNITY): Payer: Medicare Other

## 2018-12-10 ENCOUNTER — Telehealth (HOSPITAL_COMMUNITY): Payer: Self-pay | Admitting: Internal Medicine

## 2018-12-10 NOTE — Telephone Encounter (Signed)
12/10/18  pt left a message to cx her appts and she would need to begin therapy in October... she had to move suddenly

## 2018-12-11 ENCOUNTER — Emergency Department (HOSPITAL_COMMUNITY): Payer: Medicare Other

## 2018-12-11 ENCOUNTER — Emergency Department (HOSPITAL_COMMUNITY)
Admission: EM | Admit: 2018-12-11 | Discharge: 2018-12-12 | Disposition: A | Discharge status: Home / Self Care | Payer: Medicare Other | Attending: Emergency Medicine | Admitting: Emergency Medicine

## 2018-12-11 ENCOUNTER — Other Ambulatory Visit: Payer: Self-pay

## 2018-12-11 ENCOUNTER — Ambulatory Visit (HOSPITAL_COMMUNITY): Payer: Medicare Other

## 2018-12-11 ENCOUNTER — Encounter (HOSPITAL_COMMUNITY): Payer: Self-pay

## 2018-12-11 DIAGNOSIS — S0011XA Contusion of right eyelid and periocular area, initial encounter: Secondary | ICD-10-CM | POA: Diagnosis not present

## 2018-12-11 DIAGNOSIS — Y9389 Activity, other specified: Secondary | ICD-10-CM | POA: Diagnosis not present

## 2018-12-11 DIAGNOSIS — F17210 Nicotine dependence, cigarettes, uncomplicated: Secondary | ICD-10-CM | POA: Diagnosis not present

## 2018-12-11 DIAGNOSIS — S8001XA Contusion of right knee, initial encounter: Secondary | ICD-10-CM

## 2018-12-11 DIAGNOSIS — Y92019 Unspecified place in single-family (private) house as the place of occurrence of the external cause: Secondary | ICD-10-CM | POA: Diagnosis not present

## 2018-12-11 DIAGNOSIS — S0083XA Contusion of other part of head, initial encounter: Secondary | ICD-10-CM

## 2018-12-11 DIAGNOSIS — Y999 Unspecified external cause status: Secondary | ICD-10-CM | POA: Insufficient documentation

## 2018-12-11 DIAGNOSIS — S8002XA Contusion of left knee, initial encounter: Secondary | ICD-10-CM

## 2018-12-11 DIAGNOSIS — J449 Chronic obstructive pulmonary disease, unspecified: Secondary | ICD-10-CM | POA: Diagnosis not present

## 2018-12-11 DIAGNOSIS — S0990XA Unspecified injury of head, initial encounter: Secondary | ICD-10-CM | POA: Diagnosis present

## 2018-12-11 DIAGNOSIS — J45909 Unspecified asthma, uncomplicated: Secondary | ICD-10-CM | POA: Insufficient documentation

## 2018-12-11 DIAGNOSIS — T7491XA Unspecified adult maltreatment, confirmed, initial encounter: Secondary | ICD-10-CM

## 2018-12-11 MED ORDER — IBUPROFEN 400 MG PO TABS
400.0000 mg | ORAL_TABLET | Freq: Once | ORAL | Status: AC
  Administered 2018-12-11: 400 mg via ORAL
  Filled 2018-12-11: qty 1
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
## 2018-12-12 NOTE — Discharge Instructions (Addendum)
Apply ice for thirty minutes at a time, four times a day.  Take acetaminophen and/or ibuprofen as needed for pain. Please note that the combination gives you better pain relief than either by itself.

## 2018-12-12 NOTE — ED Notes (Signed)
Pt calling for ride

## 2018-12-12 NOTE — ED Notes (Signed)
Pt ride is on the way to pick up pt

## 2018-12-13 ENCOUNTER — Ambulatory Visit (HOSPITAL_COMMUNITY): Payer: Medicare Other

## 2018-12-18 ENCOUNTER — Encounter (HOSPITAL_COMMUNITY): Payer: Medicare Other

## 2018-12-20 ENCOUNTER — Encounter (HOSPITAL_COMMUNITY): Payer: Medicare Other

## 2019-01-14 ENCOUNTER — Other Ambulatory Visit: Payer: Self-pay

## 2019-01-14 ENCOUNTER — Ambulatory Visit (HOSPITAL_COMMUNITY): Payer: Medicare Other | Attending: Neurology | Admitting: Physical Therapy

## 2019-01-14 ENCOUNTER — Encounter (HOSPITAL_COMMUNITY): Payer: Self-pay | Admitting: Physical Therapy

## 2019-01-14 DIAGNOSIS — M5416 Radiculopathy, lumbar region: Secondary | ICD-10-CM | POA: Insufficient documentation

## 2019-01-14 DIAGNOSIS — R296 Repeated falls: Secondary | ICD-10-CM | POA: Insufficient documentation

## 2019-01-14 NOTE — Patient Instructions (Addendum)
Knee-to-Chest Stretch: Unilateral    With hand behind right knee, pull knee in to chest until a comfortable stretch is felt in lower back and buttocks. Keep back relaxed. Hold __30__ seconds. Repeat 3____ times per set. Do __1__ sets per session. Do __2__ sessions per day.  http://orth.exer.us/126   Copyright  VHI. All rights reserved.  Bridging    Slowly raise buttocks from floor, keeping stomach tight. Repeat _10___ times per set. Do 1____ sets per session. Do __2__ sessions per day.  http://orth.exer.us/1096   Copyright  VHI. All rights reserved.    Sit up as tall as you can hold for 10 seconds and relax repeat 5 x 5 x a day

## 2019-01-14 NOTE — Therapy (Signed)
Alexis Harris, [place], [address] Phone: [phone]   Fax:  [phone]  Physical Therapy Evaluation  Patient Details  Name: Alexis Harris MRN: [id number] Date of Birth: Nov 02, 1970 Referring Provider (PT): Creig Hines    Encounter Date: 01/14/2019  PT End of Session - 01/14/19 1550    Visit Number  1    Number of Visits  12    Date for PT Re-Evaluation  02/25/19    Authorization Type  [employer] medicare    PT Start Time  1455    PT Stop Time  1535    PT Time Calculation (min)  40 min       Past Medical History:  Diagnosis Date  . Anxiety   . Arthritis   . Asthma   . Back pain   . COPD (chronic obstructive pulmonary disease) ([place])   . Depression   . DJD (degenerative joint disease), lumbosacral   . Fibromyalgia   . Hypersomnia   . IBS (irritable bowel syndrome)   . Interstitial cystitis   . Migraine   . Migraines   . OCD (obsessive compulsive disorder)   . PONV (postoperative nausea and vomiting)   . PTSD (post-traumatic stress disorder)   . Restless leg syndrome     Past Surgical History:  Procedure Laterality Date  . ABDOMINAL HYSTERECTOMY    . ABDOMINAL SURGERY    . CATARACT EXTRACTION W/PHACO Right 11/10/2014   Procedure: CATARACT EXTRACTION PHACO AND INTRAOCULAR LENS PLACEMENT (IOC);  Surgeon: Alexis Guys, MD;  Location: [facility];  Service: Ophthalmology;  Laterality: Right;  CDE 2.04  . CATARACT EXTRACTION W/PHACO Left 11/24/2014   Procedure: CATARACT EXTRACTION PHACO AND INTRAOCULAR LENS PLACEMENT (IOC);  Surgeon: Alexis Guys, MD;  Location: [facility];  Service: Ophthalmology;  Laterality: Left;  CDE:1.04  . EXPLORATORY LAPAROTOMY     x3  . interstim medical device implanted     in lower back-to control bladder  . MOUTH SURGERY      There were no vitals filed for this visit.   Subjective Assessment - 01/14/19 1502    Subjective  [name] states that she has had LBP for multiple years.  The  patient was going to pain management but they shut the clinic down therefore she talked to her MD about her pain who referred her to therapy.  The pt has pain on both sides of her back Lt greater than RT.  On occasion she will have pain going down her LT leg to below the knee level.    Pertinent History  OA, COPD, chronic back pain, Migrains, circulation problems,fibromyalgia    Limitations  Lifting;Standing;Walking;House hold activities    How long can you sit comfortably?  need to readjust after 30 minutes.    How long can you stand comfortably?  five minutes    How long can you walk comfortably?  uses a cane or a walker not more than five minutes.    Patient Stated Goals  less pain.    Currently in Pain?  Yes    Pain Score  7     Pain Location  Back    Pain Orientation  Lower    Pain Descriptors / Indicators  Aching;Throbbing    Pain Type  Chronic pain    Pain Radiating Towards  calf    Pain Onset  More than a month ago    Pain Frequency  Constant    Aggravating Factors  standing and walking    Pain Relieving Factors  sitting    Effect of Pain on Daily Activities  limitis         [facility] PT Assessment - 01/14/19 0001      Assessment   Medical Diagnosis  LBP    Referring Provider (PT)  Creig Hines     Onset Date/Surgical Date  10/30/18    Prior Therapy  none      Precautions   Precautions  None      Restrictions   Weight Bearing Restrictions  No      Balance Screen   Has the patient fallen in the past 6 months  Yes    How many times?  5    Has the patient had a decrease in activity level because of a fear of falling?   Yes    Is the patient reluctant to leave their home because of a fear of falling?   No      Home Film/video editor residence      Prior Function   Level of Independence  Independent with household mobility with device      Cognition   Overall Cognitive Status  Within Functional Limits for tasks assessed      Observation/Other  Assessments   Focus on Therapeutic Outcomes (FOTO)   39      Functional Tests   Functional tests  Single leg stance;Sit to Stand      Single Leg Stance   Comments  RT:  15;  LT :  6"      Sit to Stand   Comments  10 in 30 seconds       Posture/Postural Control   Posture/Postural Control  Postural limitations    Postural Limitations  Rounded Shoulders;Decreased lumbar lordosis;Decreased thoracic kyphosis;Anterior pelvic tilt;Flexed trunk;Weight shift left      ROM / Strength   AROM / PROM / Strength  AROM;Strength      AROM   AROM Assessment Site  Lumbar    Lumbar Flexion  wfl   going down improved coming up increased    Lumbar Extension  0   increase pain     Strength   Strength Assessment Site  Hip;Knee;Ankle    Right/Left Hip  Right;Left    Right Hip Flexion  5/5    Right Hip Extension  4/5    Right Hip ABduction  5/5    Left Hip Flexion  5/5    Left Hip Extension  3/5    Left Hip ABduction  5/5    Right/Left Knee  Right;Left    Right Knee Extension  5/5    Left Knee Extension  5/5    Right/Left Ankle  Left;Right    Right Ankle Dorsiflexion  5/5    Right Ankle Plantar Flexion  5/5    Left Ankle Dorsiflexion  5/5    Left Ankle Plantar Flexion  5/5      Flexibility   Soft Tissue Assessment /Muscle Length  yes    Hamstrings  B 175      Ambulation/Gait   Ambulation Distance (Feet)  426 Feet    Assistive device  Small based quad cane                Objective measurements completed on examination: See above findings.      [facility] Adult PT Treatment/Exercise - 01/14/19 0001      Exercises   Exercises  Lumbar  Lumbar Exercises: Stretches   Single Knee to Chest Stretch  3 reps;30 seconds      Lumbar Exercises: Seated   Other Seated Lumbar Exercises  sit tall x 10 " x5      Lumbar Exercises: Supine   Bridge  10 reps             PT Education - 01/14/19 1547    Education Details  HEP, bed mobilty body mechanics    Person(s) Educated   Patient    Methods  Explanation;Handout;Demonstration    Comprehension  Verbalized understanding       PT Short Term Goals - 01/14/19 1558      PT SHORT TERM GOAL #1   Title  PT to be I in HEp to improve posture to decrease pain to no greater than 5/10.    Time  3    Period  Weeks    Status  New    Target Date  02/04/19      PT SHORT TERM GOAL #2   Title  PT to be able to extend her back to 10 degress to reduce stress on the lumbar spine    Time  3    Period  Weeks    Status  New      PT SHORT TERM GOAL #3   Title  PT to be able to single leg stance on both LE for at least 20" to decrease risk of falling.        PT Long Term Goals - 01/14/19 1602      PT LONG TERM GOAL #1   Title  PT back pain to be no greater than a 4/10 to allow pt to stand for 15 minutes to make a small meal.    Time  6    Period  Weeks    Status  New    Target Date  02/25/19      PT LONG TERM GOAL #2   Title  PT to be completing a walking program and be up to walking 15 minutes to allow pt to feel confident in completing short shopping trips.    Time  6    Period  Weeks    Status  New      PT LONG TERM GOAL #3   Title  PT to be able to single leg stance x 35 seconds to allow pt to feel confident walking on uneven terrain.    Time  6    Period  Weeks    Status  New      PT LONG TERM GOAL #4   Title  PT to have no radicular sx to demonstrate decreased stress on nerve root.    Time  6    Period  Weeks             Plan - 01/14/19 1552    Clinical Impression Statement  Ms. Holohan is a 48 yo female who has had chronic back pain.  She was being managed by pain management in [place] but the clinic shut down therefore her MD referred her to physical therapy.  Evaluation demonstrated significant postural dysfunction, decreased ROM, decreased balance decreased core and gluteal maximus strength.  Ms. Howdyshell will benefit from skilled PT to address these issues and improve her functional level.     Personal Factors and Comorbidities  Comorbidity 3+;Fitness;Past/Current Experience;Social Background;Time since onset of injury/illness/exacerbation    Comorbidities  fibromyalgia, OA, COPD, asthma    Examination-Activity Limitations  Bed Mobility;Bend;Carry;Dressing;Lift;Locomotion Level;Sleep;Squat;Stairs;Stand    Examination-Participation Restrictions  Cleaning;Community Activity;Laundry;Shop    Stability/Clinical Decision Making  Stable/Uncomplicated    Clinical Decision Making  Low    Rehab Potential  Fair    PT Frequency  2x / week    PT Duration  6 weeks    PT Treatment/Interventions  ADLs/Self Care Home Management;Patient/family education;Manual techniques;Gait training;Functional mobility training;Therapeutic activities;Therapeutic exercise;Balance training;Stair training    PT Next Visit Plan  Begin decompression exercises 1-5, thoracic and lumbar excursions    PT Home Exercise Plan  sitting tall, bridge, knee to chest.       Patient will benefit from skilled therapeutic intervention in order to improve the following deficits and impairments:  Abnormal gait, Cardiopulmonary status limiting activity, Decreased activity tolerance, Decreased balance, Decreased strength, Difficulty walking, Postural dysfunction, Improper body mechanics, Pain  Visit Diagnosis: Radiculopathy, lumbar region - Plan: PT plan of care cert/re-cert  Repeated falls - Plan: PT plan of care cert/re-cert     Problem List There are no active problems to display for this patient.  Rayetta Humphrey, PT CLT [phone] 01/14/2019, 4:10 PM  [place] [address] [place], [place], [address] Phone: [phone]   Fax:  [phone]  Name: Alexis Harris MRN: [id number] Date of Birth: Jun 15, 1970

## 2019-01-21 ENCOUNTER — Ambulatory Visit (HOSPITAL_COMMUNITY): Payer: Medicare Other | Admitting: Physical Therapy

## 2019-01-22 ENCOUNTER — Encounter (HOSPITAL_COMMUNITY): Payer: Self-pay

## 2019-01-22 ENCOUNTER — Other Ambulatory Visit: Payer: Self-pay

## 2019-01-22 ENCOUNTER — Ambulatory Visit (HOSPITAL_COMMUNITY): Payer: Medicare Other

## 2019-01-22 DIAGNOSIS — M5416 Radiculopathy, lumbar region: Secondary | ICD-10-CM | POA: Diagnosis not present

## 2019-01-22 DIAGNOSIS — R296 Repeated falls: Secondary | ICD-10-CM

## 2019-01-22 NOTE — Therapy (Signed)
[place], [place], [address] Phone: [phone]   Fax:  [phone]  Physical Therapy Treatment  Patient Details  Name: Alexis Harris MRN: [id number] Date of Birth: 12/29/1970 Referring Provider (PT): Creig Hines    Encounter Date: 01/22/2019  PT End of Session - 01/22/19 1319    Visit Number  2    Number of Visits  12    Date for PT Re-Evaluation  02/25/19    Authorization Type  [employer] medicare    Authorization Time Period  10/13-->02/25/19    PT Start Time  1315    PT Stop Time  1358    PT Time Calculation (min)  43 min    Activity Tolerance  Patient tolerated treatment well    Behavior During Therapy  [facility] for tasks assessed/performed       Past Medical History:  Diagnosis Date  . Anxiety   . Arthritis   . Asthma   . Back pain   . COPD (chronic obstructive pulmonary disease) ([place])   . Depression   . DJD (degenerative joint disease), lumbosacral   . Fibromyalgia   . Hypersomnia   . IBS (irritable bowel syndrome)   . Interstitial cystitis   . Migraine   . Migraines   . OCD (obsessive compulsive disorder)   . PONV (postoperative nausea and vomiting)   . PTSD (post-traumatic stress disorder)   . Restless leg syndrome     Past Surgical History:  Procedure Laterality Date  . ABDOMINAL HYSTERECTOMY    . ABDOMINAL SURGERY    . CATARACT EXTRACTION W/PHACO Right 11/10/2014   Procedure: CATARACT EXTRACTION PHACO AND INTRAOCULAR LENS PLACEMENT (IOC);  Surgeon: Rutherford Guys, MD;  Location: [facility];  Service: Ophthalmology;  Laterality: Right;  CDE 2.04  . CATARACT EXTRACTION W/PHACO Left 11/24/2014   Procedure: CATARACT EXTRACTION PHACO AND INTRAOCULAR LENS PLACEMENT (IOC);  Surgeon: Rutherford Guys, MD;  Location: [facility];  Service: Ophthalmology;  Laterality: Left;  CDE:1.04  . EXPLORATORY LAPAROTOMY     x3  . interstim medical device implanted     in lower back-to control bladder  . MOUTH SURGERY       There were no vitals filed for this visit.  Subjective Assessment - 01/22/19 1317    Subjective  Pt stated she continues to have constant pain in center of lower back and more Lt than Rt side, pain scale 7/10 throbbing pain.  No reports of radicular symptoms today.    Pertinent History  OA, COPD, chronic back pain, Migrains, circulation problems,fibromyalgia    Currently in Pain?  Yes    Pain Score  7     Pain Location  Back    Pain Orientation  Lower;Left    Pain Descriptors / Indicators  Throbbing;Aching    Pain Type  Chronic pain    Pain Onset  More than a month ago    Pain Frequency  Constant    Aggravating Factors   standing and walking    Pain Relieving Factors  sitting    Effect of Pain on Daily Activities  limits                       [facility] Adult PT Treatment/Exercise - 01/22/19 0001      Posture/Postural Control   Posture/Postural Control  Postural limitations    Postural Limitations  Rounded Shoulders;Decreased lumbar lordosis;Decreased thoracic kyphosis;Anterior pelvic tilt;Flexed trunk;Weight shift left  Exercises   Exercises  Lumbar      Lumbar Exercises: Stretches   Single Knee to Chest Stretch  2 reps;30 seconds      Lumbar Exercises: Standing   Other Standing Lumbar Exercises  3D hip excursion (STS for minisquat front of chair for mechanics)      Lumbar Exercises: Seated   Other Seated Lumbar Exercises  Tall sitting    Other Seated Lumbar Exercises  3D thoracic excursion      Lumbar Exercises: Supine   Ab Set  10 reps    AB Set Limitations  paired with breathing and counting 5" out loud to reduce valsalva maneuver    Bridge  10 reps    Other Supine Lumbar Exercises  Decompression 1-5 5reps, 3-5" holds             PT Education - 01/22/19 1326    Education Details  Reviewed goals and educated importance of compliance with HEP.  PT able to recall [facility] and bridges, though admits not began at home yet.    Person(s) Educated   Patient    Methods  Explanation;Demonstration    Comprehension  Verbalized understanding;Returned demonstration;Need further instruction       PT Short Term Goals - 01/14/19 1558      PT SHORT TERM GOAL #1   Title  PT to be I in HEp to improve posture to decrease pain to no greater than 5/10.    Time  3    Period  Weeks    Status  New    Target Date  02/04/19      PT SHORT TERM GOAL #2   Title  PT to be able to extend her back to 10 degress to reduce stress on the lumbar spine    Time  3    Period  Weeks    Status  New      PT SHORT TERM GOAL #3   Title  PT to be able to single leg stance on both LE for at least 20" to decrease risk of falling.        PT Long Term Goals - 01/14/19 1602      PT LONG TERM GOAL #1   Title  PT back pain to be no greater than a 4/10 to allow pt to stand for 15 minutes to make a small meal.    Time  6    Period  Weeks    Status  New    Target Date  02/25/19      PT LONG TERM GOAL #2   Title  PT to be completing a walking program and be up to walking 15 minutes to allow pt to feel confident in completing short shopping trips.    Time  6    Period  Weeks    Status  New      PT LONG TERM GOAL #3   Title  PT to be able to single leg stance x 35 seconds to allow pt to feel confident walking on uneven terrain.    Time  6    Period  Weeks    Status  New      PT LONG TERM GOAL #4   Title  PT to have no radicular sx to demonstrate decreased stress on nerve root.    Time  6    Period  Weeks            Plan - 01/22/19 1328  Clinical Impression Statement  Reviewed goals and educated importance of compliance with HEP.  Pt admits she has not began the exercises at home though was able to recall [facility] and bridges exercises.  Pt with tendency to hold breath wiht abdominal sets, educated on pairing breath with abdomianl contraction to support breath and reduce valsalva maneuver.  Pt encouraged to complete at home on a regular basis for maximal  benefits.  Added decompression for postural strengthening as well as 3D thoracic/hip excursions for spinal mobility.  Multimodal cueing to improve mechanics with squats, needs further education in sessions to come.    Personal Factors and Comorbidities  Comorbidity 3+;Fitness;Past/Current Experience;Social Background;Time since onset of injury/illness/exacerbation    Comorbidities  fibromyalgia, OA, COPD, asthma    Examination-Activity Limitations  Bed Mobility;Bend;Carry;Dressing;Lift;Locomotion Level;Sleep;Squat;Stairs;Stand    Examination-Participation Restrictions  Cleaning;Community Activity;Laundry;Shop    Stability/Clinical Decision Making  Stable/Uncomplicated    Clinical Decision Making  Low    Rehab Potential  Fair    PT Frequency  2x / week    PT Duration  6 weeks    PT Treatment/Interventions  ADLs/Self Care Home Management;Patient/family education;Manual techniques;Gait training;Functional mobility training;Therapeutic activities;Therapeutic exercise;Balance training;Stair training    PT Next Visit Plan  Review complaince with HEP.  Progressed to decompression with theraband and add seated rows with RTB.  Continue thoracic and lumbar excursions.    PT Home Exercise Plan  sitting tall, bridge, knee to chest.       Patient will benefit from skilled therapeutic intervention in order to improve the following deficits and impairments:  Abnormal gait, Cardiopulmonary status limiting activity, Decreased activity tolerance, Decreased balance, Decreased strength, Difficulty walking, Postural dysfunction, Improper body mechanics, Pain  Visit Diagnosis: Radiculopathy, lumbar region  Repeated falls     Problem List There are no active problems to display for this patient.  [address] LPTA; [place]  Aldona Lento 01/22/2019, 5:14 PM  [facility] [address] [place], [place], [address] Phone: [phone]   Fax:   [phone]  Name: Alexis Harris MRN: [id number] Date of Birth: 06-30-1970

## 2019-01-23 ENCOUNTER — Ambulatory Visit (HOSPITAL_COMMUNITY): Payer: Medicare Other | Admitting: Physical Therapy

## 2019-01-23 DIAGNOSIS — M5416 Radiculopathy, lumbar region: Secondary | ICD-10-CM

## 2019-01-23 DIAGNOSIS — R296 Repeated falls: Secondary | ICD-10-CM

## 2019-01-23 NOTE — Therapy (Addendum)
[place], [place], [address] Phone: [phone]   Fax:  [phone]  Physical Therapy Treatment  Patient Details  Name: Alexis Harris MRN: [id number] Date of Birth: Aug 17, 1970 Referring Provider (PT): Creig Hines    Encounter Date: 01/23/2019  PT End of Session - 01/23/19 1749    Visit Number  3    Number of Visits  12    Date for PT Re-Evaluation  02/25/19    Authorization Type  [employer] medicare    Authorization Time Period  10/13-->02/25/19    PT Start Time  1450    PT Stop Time  1530    PT Time Calculation (min)  40 min    Activity Tolerance  Patient tolerated treatment well    Behavior During Therapy  [facility] for tasks assessed/performed       Past Medical History:  Diagnosis Date  . Anxiety   . Arthritis   . Asthma   . Back pain   . COPD (chronic obstructive pulmonary disease) ([place])   . Depression   . DJD (degenerative joint disease), lumbosacral   . Fibromyalgia   . Hypersomnia   . IBS (irritable bowel syndrome)   . Interstitial cystitis   . Migraine   . Migraines   . OCD (obsessive compulsive disorder)   . PONV (postoperative nausea and vomiting)   . PTSD (post-traumatic stress disorder)   . Restless leg syndrome     Past Surgical History:  Procedure Laterality Date  . ABDOMINAL HYSTERECTOMY    . ABDOMINAL SURGERY    . CATARACT EXTRACTION W/PHACO Right 11/10/2014   Procedure: CATARACT EXTRACTION PHACO AND INTRAOCULAR LENS PLACEMENT (IOC);  Surgeon: Rutherford Guys, MD;  Location: [facility];  Service: Ophthalmology;  Laterality: Right;  CDE 2.04  . CATARACT EXTRACTION W/PHACO Left 11/24/2014   Procedure: CATARACT EXTRACTION PHACO AND INTRAOCULAR LENS PLACEMENT (IOC);  Surgeon: Rutherford Guys, MD;  Location: [facility];  Service: Ophthalmology;  Laterality: Left;  CDE:1.04  . EXPLORATORY LAPAROTOMY     x3  . interstim medical device implanted     in lower back-to control bladder  . MOUTH SURGERY       There were no vitals filed for this visit.  Subjective Assessment - 01/23/19 1458    Subjective  pt states she has not had anytime to do the exercises due to having so many Dr appointments.  States she is having 6/10 pain today in mid lower back into Lt side.    Currently in Pain?  Yes    Pain Score  6     Pain Location  Back    Pain Orientation  Left;Mid;Lower    Pain Descriptors / Indicators  Throbbing                       [facility] Adult PT Treatment/Exercise - 01/23/19 0001      Lumbar Exercises: Stretches   Single Knee to Chest Stretch  2 reps;30 seconds      Lumbar Exercises: Standing   Other Standing Lumbar Exercises  3D hip excursion (STS for minisquat front of chair for mechanics)      Lumbar Exercises: Seated   Other Seated Lumbar Exercises  scapular retraction RTB 10 reps    Other Seated Lumbar Exercises  3D thoracic excursion      Lumbar Exercises: Supine   Ab Set  10 reps    AB Set Limitations  paired with breathing and  counting 5" out loud to reduce valsalva maneuver    Bridge  10 reps    Other Supine Lumbar Exercises  Decompression 1-5 5reps, 3-5" holds    Other Supine Lumbar Exercises  tband decompression 5 reps each               PT Short Term Goals - 01/14/19 1558      PT SHORT TERM GOAL #1   Title  PT to be I in HEp to improve posture to decrease pain to no greater than 5/10.    Time  3    Period  Weeks    Status  New    Target Date  02/04/19      PT SHORT TERM GOAL #2   Title  PT to be able to extend her back to 10 degress to reduce stress on the lumbar spine    Time  3    Period  Weeks    Status  New      PT SHORT TERM GOAL #3   Title  PT to be able to single leg stance on both LE for at least 20" to decrease risk of falling.        PT Long Term Goals - 01/14/19 1602      PT LONG TERM GOAL #1   Title  PT back pain to be no greater than a 4/10 to allow pt to stand for 15 minutes to make a small meal.    Time  6     Period  Weeks    Status  New    Target Date  02/25/19      PT LONG TERM GOAL #2   Title  PT to be completing a walking program and be up to walking 15 minutes to allow pt to feel confident in completing short shopping trips.    Time  6    Period  Weeks    Status  New      PT LONG TERM GOAL #3   Title  PT to be able to single leg stance x 35 seconds to allow pt to feel confident walking on uneven terrain.    Time  6    Period  Weeks    Status  New      PT LONG TERM GOAL #4   Title  PT to have no radicular sx to demonstrate decreased stress on nerve root.    Time  6    Period  Weeks            Plan - 01/23/19 1745    Clinical Impression Statement  continued with focus on abdominal stabiliation/proper breathing technique with improving results.  Reveiwed HEP to ensure familiarity and correct form.  Pt able to recall both exercises, however needed cues to complete in correct form/breathing.  Added theraband decompression exercises with tactile cues. Pt reported no additional pain and no change in symptoms at EOS.    Personal Factors and Comorbidities  Comorbidity 3+;Fitness;Past/Current Experience;Social Background;Time since onset of injury/illness/exacerbation    Comorbidities  fibromyalgia, OA, COPD, asthma    Examination-Activity Limitations  Bed Mobility;Bend;Carry;Dressing;Lift;Locomotion Level;Sleep;Squat;Stairs;Stand    Examination-Participation Restrictions  Cleaning;Community Activity;Laundry;Shop    Stability/Clinical Decision Making  Stable/Uncomplicated    Rehab Potential  Fair    PT Frequency  2x / week    PT Duration  6 weeks    PT Treatment/Interventions  ADLs/Self Care Home Management;Patient/family education;Manual techniques;Gait training;Functional mobility training;Therapeutic activities;Therapeutic exercise;Balance training;Stair training  PT Next Visit Plan  Review complaince with HEP.  Update HEP to include decompression and seated rows (already has  band).    PT Home Exercise Plan  sitting tall, bridge, knee to chest.       Patient will benefit from skilled therapeutic intervention in order to improve the following deficits and impairments:  Abnormal gait, Cardiopulmonary status limiting activity, Decreased activity tolerance, Decreased balance, Decreased strength, Difficulty walking, Postural dysfunction, Improper body mechanics, Pain  Visit Diagnosis: Repeated falls  Radiculopathy, lumbar region     Problem List There are no active problems to display for this patient. Teena Irani, PTA/CLT [place], PTA/CLT [phone]  Teena Irani 01/23/2019, 5:51 PM  [place], [place], [address] Phone: [phone]   Fax:  [phone]  Name: Alexis Harris MRN: [id number] Date of Birth: 01-05-1971

## 2019-01-27 ENCOUNTER — Ambulatory Visit (HOSPITAL_COMMUNITY): Payer: Medicare Other | Admitting: Physical Therapy

## 2019-01-27 ENCOUNTER — Telehealth (HOSPITAL_COMMUNITY): Payer: Self-pay | Admitting: Physical Therapy

## 2019-01-27 ENCOUNTER — Other Ambulatory Visit: Payer: Self-pay

## 2019-01-27 DIAGNOSIS — M5416 Radiculopathy, lumbar region: Secondary | ICD-10-CM

## 2019-01-27 DIAGNOSIS — R296 Repeated falls: Secondary | ICD-10-CM

## 2019-01-27 NOTE — Therapy (Signed)
[place], [place], [address] Phone: [phone]   Fax:  [phone]  Patient Details  Name: Alexis Harris MRN: [id number] Date of Birth: 10/20/1970 Referring Provider:  Rosita Fire, MD  Encounter Date: 01/27/2019  Pt came into appt, got a phone call from [place] and had to leave.  RS appt for tomorrow.  Teena Irani 01/27/2019, 1:37 PM  [place] [address] [place], [place], [address] Phone: [phone]   Fax:  [phone]

## 2019-01-27 NOTE — Telephone Encounter (Signed)
Pt came into appt, got a phone call from [place] and had to leave.  RS appt for tomorrow.  Teena Irani, PTA/CLT [phone]

## 2019-01-28 ENCOUNTER — Ambulatory Visit (HOSPITAL_COMMUNITY): Payer: Medicare Other

## 2019-01-28 ENCOUNTER — Encounter (HOSPITAL_COMMUNITY): Payer: Self-pay

## 2019-01-28 DIAGNOSIS — R296 Repeated falls: Secondary | ICD-10-CM

## 2019-01-28 DIAGNOSIS — M5416 Radiculopathy, lumbar region: Secondary | ICD-10-CM

## 2019-01-28 NOTE — Therapy (Signed)
[place], [place], [address] Phone: [phone]   Fax:  [phone]  Physical Therapy Treatment  Patient Details  Name: ADRIONA WOOLBERT MRN: [id number] Date of Birth: [birth year] Referring Provider (PT): Creig Hines    Encounter Date: 01/28/2019  PT End of Session - 01/28/19 1414    Visit Number  4    Number of Visits  12    Date for PT Re-Evaluation  02/25/19    Authorization Type  [employer] medicare    Authorization Time Period  10/13-->02/25/19    PT Start Time  1403    PT Stop Time  1442    PT Time Calculation (min)  39 min    Activity Tolerance  Patient tolerated treatment well    Behavior During Therapy  [facility] for tasks assessed/performed       Past Medical History:  Diagnosis Date  . Anxiety   . Arthritis   . Asthma   . Back pain   . COPD (chronic obstructive pulmonary disease) ([place])   . Depression   . DJD (degenerative joint disease), lumbosacral   . Fibromyalgia   . Hypersomnia   . IBS (irritable bowel syndrome)   . Interstitial cystitis   . Migraine   . Migraines   . OCD (obsessive compulsive disorder)   . PONV (postoperative nausea and vomiting)   . PTSD (post-traumatic stress disorder)   . Restless leg syndrome     Past Surgical History:  Procedure Laterality Date  . ABDOMINAL HYSTERECTOMY    . ABDOMINAL SURGERY    . CATARACT EXTRACTION W/PHACO Right 11/10/2014   Procedure: CATARACT EXTRACTION PHACO AND INTRAOCULAR LENS PLACEMENT (IOC);  Surgeon: Rutherford Guys, MD;  Location: [facility];  Service: Ophthalmology;  Laterality: Right;  CDE 2.04  . CATARACT EXTRACTION W/PHACO Left 11/24/2014   Procedure: CATARACT EXTRACTION PHACO AND INTRAOCULAR LENS PLACEMENT (IOC);  Surgeon: Rutherford Guys, MD;  Location: [facility];  Service: Ophthalmology;  Laterality: Left;  CDE:1.04  . EXPLORATORY LAPAROTOMY     x3  . interstim medical device implanted     in lower back-to control bladder  . MOUTH SURGERY       There were no vitals filed for this visit.  Subjective Assessment - 01/28/19 1403    Subjective  Pt stated she has been busy and unable to complete the exercises at home, reports she went to court yesterday concerning assault.  Current pain scale 7/10 in center of lower back.    Patient Stated Goals  less pain.    Currently in Pain?  Yes    Pain Score  7     Pain Location  Back    Pain Orientation  Lower;Right;Left    Pain Descriptors / Indicators  Throbbing;Burning   stabbing pain   Pain Type  Chronic pain    Pain Radiating Towards  calf    Pain Onset  More than a month ago    Pain Frequency  Constant    Aggravating Factors   standing and walking    Pain Relieving Factors  sitting    Effect of Pain on Daily Activities  limits                       [facility] Adult PT Treatment/Exercise - 01/28/19 0001      Exercises   Exercises  Lumbar      Lumbar Exercises: Stretches   Single Knee to Chest Stretch  2  reps;30 seconds      Lumbar Exercises: Standing   Row  10 reps;Theraband    Theraband Level (Row)  Level 2 (Red)    Other Standing Lumbar Exercises  3D hip excursion (STS for minisquat front of chair for mechanics)    Other Standing Lumbar Exercises  UE liftoff      Lumbar Exercises: Seated   Other Seated Lumbar Exercises  3D thoracic excursion      Lumbar Exercises: Supine   Ab Set  10 reps    AB Set Limitations  paired with breathing and counting 5" out loud to reduce valsalva maneuver    Bent Knee Raise  10 reps;3 seconds    Bent Knee Raise Limitations  paired with breathing and abdominal sets    Bridge  10 reps    Other Supine Lumbar Exercises  reviewed theraband decompression ex for HEP               PT Short Term Goals - 01/14/19 1558      PT SHORT TERM GOAL #1   Title  PT to be I in HEp to improve posture to decrease pain to no greater than 5/10.    Time  3    Period  Weeks    Status  New    Target Date  02/04/19      PT SHORT TERM  GOAL #2   Title  PT to be able to extend her back to 10 degress to reduce stress on the lumbar spine    Time  3    Period  Weeks    Status  New      PT SHORT TERM GOAL #3   Title  PT to be able to single leg stance on both LE for at least 20" to decrease risk of falling.        PT Long Term Goals - 01/14/19 1602      PT LONG TERM GOAL #1   Title  PT back pain to be no greater than a 4/10 to allow pt to stand for 15 minutes to make a small meal.    Time  6    Period  Weeks    Status  New    Target Date  02/25/19      PT LONG TERM GOAL #2   Title  PT to be completing a walking program and be up to walking 15 minutes to allow pt to feel confident in completing short shopping trips.    Time  6    Period  Weeks    Status  New      PT LONG TERM GOAL #3   Title  PT to be able to single leg stance x 35 seconds to allow pt to feel confident walking on uneven terrain.    Time  6    Period  Weeks    Status  New      PT LONG TERM GOAL #4   Title  PT to have no radicular sx to demonstrate decreased stress on nerve root.    Time  6    Period  Weeks            Plan - 01/28/19 1710    Clinical Impression Statement  Pt continues to have difficulty pairing abdominal activation with proper breathing, began session with education on proper technique to reduce valsalva maneuver.  Progressed abdominal stabilization wiht additoinal supine exercises and posture strengthening with theraband.  Pt with difficulty  wiht correct mechanics during squats as well, needs continued education on proper mechanics.  EOS reports pain reduced.    Personal Factors and Comorbidities  Comorbidity 3+;Fitness;Past/Current Experience;Social Background;Time since onset of injury/illness/exacerbation    Comorbidities  fibromyalgia, OA, COPD, asthma    Examination-Activity Limitations  Bed Mobility;Bend;Carry;Dressing;Lift;Locomotion Level;Sleep;Squat;Stairs;Stand    Examination-Participation Restrictions   Cleaning;Community Activity;Laundry;Shop    Stability/Clinical Decision Making  Stable/Uncomplicated    Clinical Decision Making  Low    Rehab Potential  Fair    PT Frequency  2x / week    PT Duration  6 weeks    PT Treatment/Interventions  ADLs/Self Care Home Management;Patient/family education;Manual techniques;Gait training;Functional mobility training;Therapeutic activities;Therapeutic exercise;Balance training;Stair training    PT Next Visit Plan  Review complaince with HEP.  Update HEP to include decompression and seated rows (already has band).    PT Home Exercise Plan  sitting tall, bridge, knee to chest.; theraband decompression exercise       Patient will benefit from skilled therapeutic intervention in order to improve the following deficits and impairments:  Abnormal gait, Cardiopulmonary status limiting activity, Decreased activity tolerance, Decreased balance, Decreased strength, Difficulty walking, Postural dysfunction, Improper body mechanics, Pain  Visit Diagnosis: Repeated falls  Radiculopathy, lumbar region     Problem List There are no active problems to display for this patient.  [address] LPTA; CBIS [phone]  Aldona Lento 01/28/2019, 5:34 PM  [place] [address] [place], [place], [address] Phone: [phone]   Fax:  [phone]  Name: RALISHA INVERSO MRN: [id number] Date of Birth: [birth year]

## 2019-01-29 ENCOUNTER — Other Ambulatory Visit: Payer: Self-pay

## 2019-01-29 ENCOUNTER — Ambulatory Visit (HOSPITAL_COMMUNITY): Payer: Medicare Other | Admitting: Physical Therapy

## 2019-01-29 DIAGNOSIS — M5416 Radiculopathy, lumbar region: Secondary | ICD-10-CM | POA: Diagnosis not present

## 2019-01-29 DIAGNOSIS — R296 Repeated falls: Secondary | ICD-10-CM

## 2019-01-29 NOTE — Therapy (Signed)
[place], [place], [address] Phone: [phone]   Fax:  [phone]  Physical Therapy Treatment  Patient Details  Name: Alexis Harris MRN: [id number] Date of Birth: 01-15-1971 Referring Provider (PT): Creig Hines    Encounter Date: 01/29/2019  PT End of Session - 01/29/19 1518    Visit Number  5    Number of Visits  12    Date for PT Re-Evaluation  02/25/19    Authorization Type  [employer] medicare    Authorization Time Period  10/13-->02/25/19    PT Start Time  1452    PT Stop Time  1535    PT Time Calculation (min)  43 min    Activity Tolerance  Patient tolerated treatment well    Behavior During Therapy  [facility] for tasks assessed/performed       Past Medical History:  Diagnosis Date  . Anxiety   . Arthritis   . Asthma   . Back pain   . COPD (chronic obstructive pulmonary disease) ([place])   . Depression   . DJD (degenerative joint disease), lumbosacral   . Fibromyalgia   . Hypersomnia   . IBS (irritable bowel syndrome)   . Interstitial cystitis   . Migraine   . Migraines   . OCD (obsessive compulsive disorder)   . PONV (postoperative nausea and vomiting)   . PTSD (post-traumatic stress disorder)   . Restless leg syndrome     Past Surgical History:  Procedure Laterality Date  . ABDOMINAL HYSTERECTOMY    . ABDOMINAL SURGERY    . CATARACT EXTRACTION W/PHACO Right 11/10/2014   Procedure: CATARACT EXTRACTION PHACO AND INTRAOCULAR LENS PLACEMENT (IOC);  Surgeon: Rutherford Guys, MD;  Location: [facility];  Service: Ophthalmology;  Laterality: Right;  CDE 2.04  . CATARACT EXTRACTION W/PHACO Left 11/24/2014   Procedure: CATARACT EXTRACTION PHACO AND INTRAOCULAR LENS PLACEMENT (IOC);  Surgeon: Rutherford Guys, MD;  Location: [facility];  Service: Ophthalmology;  Laterality: Left;  CDE:1.04  . EXPLORATORY LAPAROTOMY     x3  . interstim medical device implanted     in lower back-to control bladder  . MOUTH SURGERY       There were no vitals filed for this visit.  Subjective Assessment - 01/29/19 1500    Subjective  Pt states her pain is 6/10 in her central LB.  Reports she still has not done any of her exercises at home but she will do them this weekend    Currently in Pain?  Yes    Pain Score  6     Pain Location  Back    Pain Orientation  Mid;Lower                       [facility] Adult PT Treatment/Exercise - 01/29/19 0001      Lumbar Exercises: Standing   Scapular Retraction  10 reps;Theraband    Theraband Level (Scapular Retraction)  Level 2 (Red)    Row  10 reps;Theraband    Theraband Level (Row)  Level 2 (Red)    Shoulder Extension  10 reps;Theraband    Theraband Level (Shoulder Extension)  Level 2 (Red)    Other Standing Lumbar Exercises  UE liftoff 10X, UE flexion to wall 10 reps      Lumbar Exercises: Seated   Other Seated Lumbar Exercises  3D thoracic excursion      Lumbar Exercises: Supine   Ab Set  10 reps  AB Set Limitations  paired with breathing and counting 5" out loud to reduce valsalva maneuver    Other Supine Lumbar Exercises  Decompression 1-5 5reps, 3-5" holds    Other Supine Lumbar Exercises  theraband decompression 5X             PT Education - 01/29/19 1502    Education Details  Importance of completing HEP for therapy to be beneficial.    Person(s) Educated  Patient    Methods  Explanation    Comprehension  Verbalized understanding       PT Short Term Goals - 01/14/19 1558      PT SHORT TERM GOAL #1   Title  PT to be I in HEp to improve posture to decrease pain to no greater than 5/10.    Time  3    Period  Weeks    Status  New    Target Date  02/04/19      PT SHORT TERM GOAL #2   Title  PT to be able to extend her back to 10 degress to reduce stress on the lumbar spine    Time  3    Period  Weeks    Status  New      PT SHORT TERM GOAL #3   Title  PT to be able to single leg stance on both LE for at least 20" to decrease risk  of falling.        PT Long Term Goals - 01/14/19 1602      PT LONG TERM GOAL #1   Title  PT back pain to be no greater than a 4/10 to allow pt to stand for 15 minutes to make a small meal.    Time  6    Period  Weeks    Status  New    Target Date  02/25/19      PT LONG TERM GOAL #2   Title  PT to be completing a walking program and be up to walking 15 minutes to allow pt to feel confident in completing short shopping trips.    Time  6    Period  Weeks    Status  New      PT LONG TERM GOAL #3   Title  PT to be able to single leg stance x 35 seconds to allow pt to feel confident walking on uneven terrain.    Time  6    Period  Weeks    Status  New      PT LONG TERM GOAL #4   Title  PT to have no radicular sx to demonstrate decreased stress on nerve root.    Time  6    Period  Weeks            Plan - 01/29/19 1519    Clinical Impression Statement  Began with postural exercises including UE flexion against the wall to encourage extension.  Theraband exercises completed with constant tactile and verbal cues for form and maintaining erect posturing.  At completing while walking over to stairwell, patient stopped and requested to sit as her feet had gone numb and she couldnt feel them.  Therapist Herbert Spires) came over and discussed sumptoms with patient and agreed may be some impingement as she is normally foward flexed and may need to go more slowly on extension.  Completed session with decompression exercises to help relieve symptoms and irritaion.  Pt reported no numbness at EOS. Reviewed decompression exercises  with pateint and given additional copies for HEP to complete this weekend at home.    Personal Factors and Comorbidities  Comorbidity 3+;Fitness;Past/Current Experience;Social Background;Time since onset of injury/illness/exacerbation    Comorbidities  fibromyalgia, OA, COPD, asthma    Examination-Activity Limitations  Bed Mobility;Bend;Carry;Dressing;Lift;Locomotion  Level;Sleep;Squat;Stairs;Stand    Examination-Participation Restrictions  Cleaning;Community Activity;Laundry;Shop    Stability/Clinical Decision Making  Stable/Uncomplicated    Rehab Potential  Fair    PT Frequency  2x / week    PT Duration  6 weeks    PT Treatment/Interventions  ADLs/Self Care Home Management;Patient/family education;Manual techniques;Gait training;Functional mobility training;Therapeutic activities;Therapeutic exercise;Balance training;Stair training    PT Next Visit Plan  Review complaince with HEP.  Follow up on recurring numbness/tingling into feet.    PT Home Exercise Plan  sitting tall, bridge, knee to chest.; theraband decompression exercise       Patient will benefit from skilled therapeutic intervention in order to improve the following deficits and impairments:  Abnormal gait, Cardiopulmonary status limiting activity, Decreased activity tolerance, Decreased balance, Decreased strength, Difficulty walking, Postural dysfunction, Improper body mechanics, Pain  Visit Diagnosis: Repeated falls  Radiculopathy, lumbar region     Problem List There are no active problems to display for this patient.  Teena Irani, PTA/CLT [phone]  Teena Irani 01/29/2019, 3:34 PM  [place], [place], [address] Phone: [phone]   Fax:  [phone]  Name: Alexis Harris MRN: [id number] Date of Birth: March 27, 1971

## 2019-02-04 ENCOUNTER — Ambulatory Visit (HOSPITAL_COMMUNITY): Payer: Medicare Other | Attending: Neurology | Admitting: Physical Therapy

## 2019-02-04 ENCOUNTER — Encounter (HOSPITAL_COMMUNITY): Payer: Self-pay | Admitting: Physical Therapy

## 2019-02-04 ENCOUNTER — Other Ambulatory Visit: Payer: Self-pay

## 2019-02-04 DIAGNOSIS — R296 Repeated falls: Secondary | ICD-10-CM

## 2019-02-04 DIAGNOSIS — M5416 Radiculopathy, lumbar region: Secondary | ICD-10-CM | POA: Diagnosis not present

## 2019-02-04 NOTE — Therapy (Signed)
[place], [place], [address] Phone: [phone]   Fax:  [phone]  Physical Therapy Treatment  Patient Details  Name: Alexis Harris MRN: [id number] Date of Birth: 04/08/1970 Referring Provider (PT): Creig Hines    Encounter Date: 02/04/2019  PT End of Session - 02/04/19 1209    Visit Number  6    Number of Visits  12    Date for PT Re-Evaluation  02/25/19    Authorization Type  [employer] medicare    Authorization Time Period  10/13-->02/25/19    PT Start Time  1125    PT Stop Time  1215    PT Time Calculation (min)  50 min    Activity Tolerance  Patient tolerated treatment well    Behavior During Therapy  [facility] for tasks assessed/performed       Past Medical History:  Diagnosis Date  . Anxiety   . Arthritis   . Asthma   . Back pain   . COPD (chronic obstructive pulmonary disease) ([place])   . Depression   . DJD (degenerative joint disease), lumbosacral   . Fibromyalgia   . Hypersomnia   . IBS (irritable bowel syndrome)   . Interstitial cystitis   . Migraine   . Migraines   . OCD (obsessive compulsive disorder)   . PONV (postoperative nausea and vomiting)   . PTSD (post-traumatic stress disorder)   . Restless leg syndrome     Past Surgical History:  Procedure Laterality Date  . ABDOMINAL HYSTERECTOMY    . ABDOMINAL SURGERY    . CATARACT EXTRACTION W/PHACO Right 11/10/2014   Procedure: CATARACT EXTRACTION PHACO AND INTRAOCULAR LENS PLACEMENT (IOC);  Surgeon: Rutherford Guys, MD;  Location: [facility];  Service: Ophthalmology;  Laterality: Right;  CDE 2.04  . CATARACT EXTRACTION W/PHACO Left 11/24/2014   Procedure: CATARACT EXTRACTION PHACO AND INTRAOCULAR LENS PLACEMENT (IOC);  Surgeon: Rutherford Guys, MD;  Location: [facility];  Service: Ophthalmology;  Laterality: Left;  CDE:1.04  . EXPLORATORY LAPAROTOMY     x3  . interstim medical device implanted     in lower back-to control bladder  . MOUTH SURGERY       There were no vitals filed for this visit.  Subjective Assessment - 02/04/19 1133    Subjective  PT states that she is completing her exercises approximately one time a week.    Pertinent History  OA, COPD, chronic back pain, Migrains, circulation problems,fibromyalgia    Limitations  Standing    How long can you sit comfortably?  If she can shift postitions every 30 minutes.    How long can you stand comfortably?  less than five minutes    How long can you walk comfortably?  Able to walk for 15 minutes    Currently in Pain?  Yes    Pain Score  5     Pain Location  Back    Pain Orientation  Lower    Pain Descriptors / Indicators  Aching    Pain Type  Chronic pain    Pain Radiating Towards  upper thigh    Pain Onset  More than a month ago    Pain Frequency  Intermittent              [facility] Adult PT Treatment/Exercise - 02/04/19 0001      Exercises   Exercises  Lumbar      Lumbar Exercises: Standing   Scapular Retraction  10 reps;Theraband  Theraband Level (Scapular Retraction)  Level 2 (Red)    Row  10 reps;Theraband    Theraband Level (Row)  Level 2 (Red)    Shoulder Extension  10 reps;Theraband    Theraband Level (Shoulder Extension)  Level 2 (Red)    Other Standing Lumbar Exercises  single leg stance x 3     Other Standing Lumbar Exercises  V lift off; B UE flexion x 10 each       Lumbar Exercises: Supine   Isometric Hip Flexion  10 reps    Large Ball Oblique Isometric  10reps    Other Supine Lumbar Exercises  toe tap x 10     Lumbar Exercises: Quadruped   [place]/Old Horse  5 reps               PT Short Term Goals - 02/04/19 1138      PT SHORT TERM GOAL #1   Title  PT to be I in HEp to improve posture to decrease pain to no greater than 5/10.    Time  3    Period  Weeks    Status  On-going    Target Date  02/04/19      PT SHORT TERM GOAL #2   Title  PT to be able to extend her back to 10 degress to reduce stress on the lumbar spine     Time  3    Period  Weeks    Status  Achieved      PT SHORT TERM GOAL #3   Title  PT to be able to single leg stance on both LE for at least 20" to decrease risk of falling.    Status  Achieved        PT Long Term Goals - 02/04/19 1140      PT LONG TERM GOAL #1   Title  PT back pain to be no greater than a 4/10 to allow pt to stand for 15 minutes to make a small meal.    Time  6    Period  Weeks    Status  On-going      PT LONG TERM GOAL #2   Title  PT to be completing a walking program and be up to walking 15 minutes to allow pt to feel confident in completing short shopping trips.    Time  6    Period  Weeks    Status  Not Met      PT LONG TERM GOAL #3   Title  PT to be able to single leg stance x 35 seconds to allow pt to feel confident walking on uneven terrain.    Time  6    Period  Weeks    Status  On-going      PT LONG TERM GOAL #4   Title  PT to have no radicular sx to demonstrate decreased stress on nerve root.    Time  6    Period  Weeks    Status  On-going            Plan - 02/04/19 1209    Clinical Impression Statement  Completed foto with a 6 pt improvement; Reviewed pt goals with 2/3 of STG achieved and no LTG achieved.  PT is only completing exercises once a week.  Explained to pt that she needs to be her own advocate and be completing and walking program as well as completing the HEP at least 3 x a week  if she really wants to see improvement; pt vocalized understanding.  No new HEP given due to pt not completing what she currently has. Therapist added lower ab strengtening exercises as well as mad cat to stretch thoracic/lumbar spine.    Personal Factors and Comorbidities  Comorbidity 3+;Fitness;Past/Current Experience;Social Background;Time since onset of injury/illness/exacerbation    Comorbidities  fibromyalgia, OA, COPD, asthma    Examination-Activity Limitations  Bed Mobility;Bend;Carry;Dressing;Lift;Locomotion Level;Sleep;Squat;Stairs;Stand     Examination-Participation Restrictions  Cleaning;Community Activity;Laundry;Shop    Stability/Clinical Decision Making  Stable/Uncomplicated    Rehab Potential  Fair    PT Frequency  2x / week    PT Duration  6 weeks    PT Treatment/Interventions  ADLs/Self Care Home Management;Patient/family education;Manual techniques;Gait training;Functional mobility training;Therapeutic activities;Therapeutic exercise;Balance training;Stair training    PT Next Visit Plan  continue to work on core strengthening and improving thoracic/lumbar mobility and posture.    PT Home Exercise Plan  sitting tall, bridge, knee to chest.; theraband decompression exercise       Patient will benefit from skilled therapeutic intervention in order to improve the following deficits and impairments:  Abnormal gait, Cardiopulmonary status limiting activity, Decreased activity tolerance, Decreased balance, Decreased strength, Difficulty walking, Postural dysfunction, Improper body mechanics, Pain  Visit Diagnosis: Repeated falls  Radiculopathy, lumbar region     Problem List There are no active problems to display for this patient.  Rayetta Humphrey, PT CLT [phone] 02/04/2019, 12:25 PM  [place] [address] [place], [place], [address] Phone: [phone]   Fax:  [phone]  Name: Alexis Harris MRN: [id number] Date of Birth: 1970/04/07

## 2019-02-06 ENCOUNTER — Encounter (HOSPITAL_COMMUNITY): Payer: Self-pay | Admitting: Physical Therapy

## 2019-02-06 ENCOUNTER — Ambulatory Visit (HOSPITAL_COMMUNITY): Payer: Medicare Other | Admitting: Physical Therapy

## 2019-02-06 ENCOUNTER — Other Ambulatory Visit: Payer: Self-pay

## 2019-02-06 DIAGNOSIS — M5416 Radiculopathy, lumbar region: Secondary | ICD-10-CM | POA: Diagnosis not present

## 2019-02-06 DIAGNOSIS — R296 Repeated falls: Secondary | ICD-10-CM

## 2019-02-06 NOTE — Patient Instructions (Addendum)
Upper / Lower Extremity Extension (All-Fours)    Tighten stomach and raise right leg and opposite arm. Keep trunk rigid. Repeat _10__ times per set. Do _1___ sets per session. Do __2__ sessions per day.  http://orth.exer.us/1118   Copyright  VHI. All rights reserved.  Cat Back    Kneel on hands and knees. Tuck chin and tighten stomach. Exhale and round back upward. Inhale and arch back downward. Hold each position _10__ seconds. Repeat __3_ times per session. Do ___1 sessions per day.  Copyright  VHI. All rights reserved.  Piriformis Stretch, Kneeling Modified [place]on    From hands and knees, slide one leg backward, turn bent other leg out slightly to side. Rest weight on outside of bent leg. If extended hip is elevated place a blanket or towel underneath to relax hip. With back straight, lay trunk forward over bent leg. Hold _20__ seconds.  Repeat 2___ times per session. Do _1__ sessions per day.  Copyright  VHI. All rights reserved.

## 2019-02-06 NOTE — Therapy (Signed)
[place], [place], [address] Phone: [phone]   Fax:  [phone]  Physical Therapy Treatment  Patient Details  Name: Alexis Harris MRN: [id number] Date of Birth: 1970/06/30 Referring Provider (PT): Creig Hines    Encounter Date: 02/06/2019  PT End of Session - 02/06/19 1433    Visit Number  7    Number of Visits  12    Date for PT Re-Evaluation  02/25/19    Authorization Type  [employer] medicare    Authorization Time Period  10/13-->02/25/19    PT Start Time  1406    PT Stop Time  1445    PT Time Calculation (min)  39 min    Activity Tolerance  Patient tolerated treatment well    Behavior During Therapy  [facility] for tasks assessed/performed       Past Medical History:  Diagnosis Date  . Anxiety   . Arthritis   . Asthma   . Back pain   . COPD (chronic obstructive pulmonary disease) ([place])   . Depression   . DJD (degenerative joint disease), lumbosacral   . Fibromyalgia   . Hypersomnia   . IBS (irritable bowel syndrome)   . Interstitial cystitis   . Migraine   . Migraines   . OCD (obsessive compulsive disorder)   . PONV (postoperative nausea and vomiting)   . PTSD (post-traumatic stress disorder)   . Restless leg syndrome     Past Surgical History:  Procedure Laterality Date  . ABDOMINAL HYSTERECTOMY    . ABDOMINAL SURGERY    . CATARACT EXTRACTION W/PHACO Right 11/10/2014   Procedure: CATARACT EXTRACTION PHACO AND INTRAOCULAR LENS PLACEMENT (IOC);  Surgeon: Rutherford Guys, MD;  Location: [facility];  Service: Ophthalmology;  Laterality: Right;  CDE 2.04  . CATARACT EXTRACTION W/PHACO Left 11/24/2014   Procedure: CATARACT EXTRACTION PHACO AND INTRAOCULAR LENS PLACEMENT (IOC);  Surgeon: Rutherford Guys, MD;  Location: [facility];  Service: Ophthalmology;  Laterality: Left;  CDE:1.04  . EXPLORATORY LAPAROTOMY     x3  . interstim medical device implanted     in lower back-to control bladder  . MOUTH SURGERY       There were no vitals filed for this visit.  Subjective Assessment - 02/06/19 1409    Subjective  PT states her back is well but her stomach mm are sore from the last visit    Pertinent History  OA, COPD, chronic back pain, Migrains, circulation problems,fibromyalgia    Limitations  Standing    How long can you sit comfortably?  If she can shift postitions every 30 minutes.    How long can you stand comfortably?  less than five minutes    How long can you walk comfortably?  Able to walk for 15 minutes    Pain Onset  More than a month ago           [facility] Adult PT Treatment/Exercise - 02/06/19 0001      Exercises   Exercises  Lumbar      Lumbar Exercises: Stretches   Single Knee to Chest Stretch  Right;Left;2 reps;60 seconds    Figure 4 Stretch  20 seconds;3 reps      Lumbar Exercises: Machines for Strengthening   Leg Press  4PL x 15       Lumbar Exercises: Standing   Other Standing Lumbar Exercises  single leg stance x 3     Other Standing Lumbar Exercises  Paloff x 10  B       Lumbar Exercises: Seated   Sit to Stand  10 reps    Other Seated Lumbar Exercises  3D thoracic excursion      Lumbar Exercises: Supine   Isometric Hip Flexion  15 reps    Large Ball Oblique Isometric  10 reps    Other Supine Lumbar Exercises  toe tap x 10      Lumbar Exercises: Quadruped   Madcat/Old Horse  5 reps    Opposite Arm/Leg Raise  10 reps    Other Quadruped Lumbar Exercises  10               PT Short Term Goals - 02/04/19 1138      PT SHORT TERM GOAL #1   Title  PT to be I in HEp to improve posture to decrease pain to no greater than 5/10.    Time  3    Period  Weeks    Status  On-going    Target Date  02/04/19      PT SHORT TERM GOAL #2   Title  PT to be able to extend her back to 10 degress to reduce stress on the lumbar spine    Time  3    Period  Weeks    Status  Achieved      PT SHORT TERM GOAL #3   Title  PT to be able to single leg stance on both LE  for at least 20" to decrease risk of falling.    Status  Achieved        PT Long Term Goals - 02/04/19 1140      PT LONG TERM GOAL #1   Title  PT back pain to be no greater than a 4/10 to allow pt to stand for 15 minutes to make a small meal.    Time  6    Period  Weeks    Status  On-going      PT LONG TERM GOAL #2   Title  PT to be completing a walking program and be up to walking 15 minutes to allow pt to feel confident in completing short shopping trips.    Time  6    Period  Weeks    Status  Not Met      PT LONG TERM GOAL #3   Title  PT to be able to single leg stance x 35 seconds to allow pt to feel confident walking on uneven terrain.    Time  6    Period  Weeks    Status  On-going      PT LONG TERM GOAL #4   Title  PT to have no radicular sx to demonstrate decreased stress on nerve root.    Time  6    Period  Weeks    Status  On-going            Plan - 02/06/19 1433    Clinical Impression Statement  Added leg press, sit to stand , quadriped opposite arm/leg to address decreased core/gluteal strength. PT tolerated new exercises well.    Personal Factors and Comorbidities  Comorbidity 3+;Fitness;Past/Current Experience;Social Background;Time since onset of injury/illness/exacerbation    Comorbidities  fibromyalgia, OA, COPD, asthma    Examination-Activity Limitations  Bed Mobility;Bend;Carry;Dressing;Lift;Locomotion Level;Sleep;Squat;Stairs;Stand    Examination-Participation Restrictions  Cleaning;Community Activity;Laundry;Shop    Stability/Clinical Decision Making  Stable/Uncomplicated    Rehab Potential  Fair    PT Frequency  2x /  week    PT Duration  6 weeks    PT Treatment/Interventions  ADLs/Self Care Home Management;Patient/family education;Manual techniques;Gait training;Functional mobility training;Therapeutic activities;Therapeutic exercise;Balance training;Stair training    PT Next Visit Plan  continue to work on core strengthening and improving  thoracic/lumbar mobility and posture.    PT Home Exercise Plan  sitting tall, bridge, knee to chest.; theraband decompression exercise       Patient will benefit from skilled therapeutic intervention in order to improve the following deficits and impairments:  Abnormal gait, Cardiopulmonary status limiting activity, Decreased activity tolerance, Decreased balance, Decreased strength, Difficulty walking, Postural dysfunction, Improper body mechanics, Pain  Visit Diagnosis: Repeated falls  Radiculopathy, lumbar region     Problem List There are no active problems to display for this patient. Rayetta Humphrey, PT CLT [phone] 02/06/2019, 2:49 PM  [place] [address] [place], [place], [address] Phone: [phone]   Fax:  [phone]  Name: Alexis Harris MRN: [id number] Date of Birth: 01-Jun-1970

## 2019-02-07 DIAGNOSIS — N3010 Interstitial cystitis (chronic) without hematuria: Secondary | ICD-10-CM | POA: Diagnosis not present

## 2019-02-11 ENCOUNTER — Encounter (HOSPITAL_COMMUNITY): Payer: Self-pay

## 2019-02-11 ENCOUNTER — Ambulatory Visit (HOSPITAL_COMMUNITY): Payer: Medicare Other

## 2019-02-11 ENCOUNTER — Other Ambulatory Visit: Payer: Self-pay

## 2019-02-11 DIAGNOSIS — M5416 Radiculopathy, lumbar region: Secondary | ICD-10-CM

## 2019-02-11 DIAGNOSIS — R296 Repeated falls: Secondary | ICD-10-CM

## 2019-02-11 NOTE — Therapy (Signed)
[place], [place], [address] Phone: [phone]   Fax:  [phone]  Physical Therapy Treatment  Patient Details  Name: Alexis Harris MRN: [id number] Date of Birth: 13-Feb-1971 Referring Provider (PT): Creig Hines    Encounter Date: 02/11/2019  PT End of Session - 02/11/19 1544    Visit Number  8    Number of Visits  12    Date for PT Re-Evaluation  02/25/19    Authorization Type  [employer] medicare    Authorization Time Period  10/13-->02/25/19    Authorization - Visit Number  8    Authorization - Number of Visits  10    PT Start Time  2376    PT Stop Time  1615    PT Time Calculation (min)  41 min    Activity Tolerance  Patient tolerated treatment well    Behavior During Therapy  [facility] for tasks assessed/performed       Past Medical History:  Diagnosis Date  . Anxiety   . Arthritis   . Asthma   . Back pain   . COPD (chronic obstructive pulmonary disease) ([place])   . Depression   . DJD (degenerative joint disease), lumbosacral   . Fibromyalgia   . Hypersomnia   . IBS (irritable bowel syndrome)   . Interstitial cystitis   . Migraine   . Migraines   . OCD (obsessive compulsive disorder)   . PONV (postoperative nausea and vomiting)   . PTSD (post-traumatic stress disorder)   . Restless leg syndrome     Past Surgical History:  Procedure Laterality Date  . ABDOMINAL HYSTERECTOMY    . ABDOMINAL SURGERY    . CATARACT EXTRACTION W/PHACO Right 11/10/2014   Procedure: CATARACT EXTRACTION PHACO AND INTRAOCULAR LENS PLACEMENT (IOC);  Surgeon: Rutherford Guys, MD;  Location: [facility];  Service: Ophthalmology;  Laterality: Right;  CDE 2.04  . CATARACT EXTRACTION W/PHACO Left 11/24/2014   Procedure: CATARACT EXTRACTION PHACO AND INTRAOCULAR LENS PLACEMENT (IOC);  Surgeon: Rutherford Guys, MD;  Location: [facility];  Service: Ophthalmology;  Laterality: Left;  CDE:1.04  . EXPLORATORY LAPAROTOMY     x3  . interstim medical device  implanted     in lower back-to control bladder  . MOUTH SURGERY      There were no vitals filed for this visit.  Subjective Assessment - 02/11/19 1539    Subjective  Pt admits to not completing her HEP as much as she knows she should.    Pertinent History  OA, COPD, chronic back pain, Migrains, circulation problems,fibromyalgia    Patient Stated Goals  less pain.    Currently in Pain?  Yes    Pain Score  5     Pain Location  Back    Pain Orientation  Lower    Pain Descriptors / Indicators  Aching    Pain Type  Chronic pain    Pain Onset  More than a month ago    Pain Frequency  Intermittent    Aggravating Factors   standing and walking    Pain Relieving Factors  sitting    Effect of Pain on Daily Activities  limits                       [facility] Adult PT Treatment/Exercise - 02/11/19 0001      Exercises   Exercises  Lumbar      Lumbar Exercises: Machines for Strengthening   Leg  Press  4PL x 15       Lumbar Exercises: Standing   Functional Squats  10 reps    Functional Squats Limitations  3D hip excursion with UE for thoracic mobility; squats infront of chair for mechanics    Other Standing Lumbar Exercises  vector stance 2x 5" BLE intermittent HHA    Other Standing Lumbar Exercises  Paloff x 10 4 sets with GTB      Lumbar Exercises: Seated   Sit to Stand  10 reps      Lumbar Exercises: Supine   Isometric Hip Flexion  15 reps;5 seconds    Isometric Hip Flexion Limitations  cueing for breathing    Large Ball Oblique Isometric  10 reps;5 seconds    Other Supine Lumbar Exercises  toe tap x 10      Lumbar Exercises: Quadruped   Madcat/Old Horse  10 reps    Single Arm Raise  10 reps    Straight Leg Raise  5 reps    Opposite Arm/Leg Raise  10 reps             PT Education - 02/11/19 1543    Education Details  Reviewed importance of completing HEP for maximal benefits.  Discussed setting reminders and different times in the day more likely to  complete.    Person(s) Educated  Patient    Methods  Explanation    Comprehension  Verbalized understanding;Need further instruction       PT Short Term Goals - 02/04/19 1138      PT SHORT TERM GOAL #1   Title  PT to be I in HEp to improve posture to decrease pain to no greater than 5/10.    Time  3    Period  Weeks    Status  On-going    Target Date  02/04/19      PT SHORT TERM GOAL #2   Title  PT to be able to extend her back to 10 degress to reduce stress on the lumbar spine    Time  3    Period  Weeks    Status  Achieved      PT SHORT TERM GOAL #3   Title  PT to be able to single leg stance on both LE for at least 20" to decrease risk of falling.    Status  Achieved        PT Long Term Goals - 02/04/19 1140      PT LONG TERM GOAL #1   Title  PT back pain to be no greater than a 4/10 to allow pt to stand for 15 minutes to make a small meal.    Time  6    Period  Weeks    Status  On-going      PT LONG TERM GOAL #2   Title  PT to be completing a walking program and be up to walking 15 minutes to allow pt to feel confident in completing short shopping trips.    Time  6    Period  Weeks    Status  Not Met      PT LONG TERM GOAL #3   Title  PT to be able to single leg stance x 35 seconds to allow pt to feel confident walking on uneven terrain.    Time  6    Period  Weeks    Status  On-going      PT LONG TERM GOAL #4  Title  PT to have no radicular sx to demonstrate decreased stress on nerve root.    Time  6    Period  Weeks    Status  On-going            Plan - 02/11/19 1606    Clinical Impression Statement  Added squats and vector stance for core stability and gluteal strengthening, pt tolerated well to all exercises with no reoprts of increased pain through session.  Reviewed importance of compliance wiht HEP and discussed reminders and/or breaking up exercises through the day to improve compliance, veralized understanding.    Personal Factors and  Comorbidities  Comorbidity 3+;Fitness;Past/Current Experience;Social Background;Time since onset of injury/illness/exacerbation    Comorbidities  fibromyalgia, OA, COPD, asthma    Examination-Activity Limitations  Bed Mobility;Bend;Carry;Dressing;Lift;Locomotion Level;Sleep;Squat;Stairs;Stand    Examination-Participation Restrictions  Cleaning;Community Activity;Laundry;Shop    Stability/Clinical Decision Making  Stable/Uncomplicated    Clinical Decision Making  Low    Rehab Potential  Fair    PT Frequency  2x / week    PT Duration  6 weeks    PT Treatment/Interventions  ADLs/Self Care Home Management;Patient/family education;Manual techniques;Gait training;Functional mobility training;Therapeutic activities;Therapeutic exercise;Balance training;Stair training    PT Next Visit Plan  continue to work on core strengthening and improving thoracic/lumbar mobility and posture.    PT Home Exercise Plan  sitting tall, bridge, knee to chest.; theraband decompression exercise       Patient will benefit from skilled therapeutic intervention in order to improve the following deficits and impairments:  Abnormal gait, Cardiopulmonary status limiting activity, Decreased activity tolerance, Decreased balance, Decreased strength, Difficulty walking, Postural dysfunction, Improper body mechanics, Pain  Visit Diagnosis: Radiculopathy, lumbar region  Repeated falls     Problem List There are no active problems to display for this patient.  [address], LPTA; [place]  Aldona Lento 02/11/2019, 4:25 PM  [place], [place], [address] Phone: [phone]   Fax:  [phone]  Name: Alexis Harris MRN: [id number] Date of Birth: July 10, 1970

## 2019-02-13 ENCOUNTER — Ambulatory Visit (HOSPITAL_COMMUNITY): Payer: Medicare Other | Admitting: Physical Therapy

## 2019-02-13 ENCOUNTER — Other Ambulatory Visit: Payer: Self-pay

## 2019-02-13 DIAGNOSIS — R296 Repeated falls: Secondary | ICD-10-CM | POA: Diagnosis not present

## 2019-02-13 DIAGNOSIS — M5416 Radiculopathy, lumbar region: Secondary | ICD-10-CM

## 2019-02-13 NOTE — Therapy (Signed)
[place], [place], [address] Phone: [phone]   Fax:  [phone]  Physical Therapy Treatment  Patient Details  Name: Alexis Harris MRN: [id number] Date of Birth: [birth year] Referring Provider (PT): Creig Hines    Encounter Date: 02/13/2019  PT End of Session - 02/13/19 1529    Visit Number  9    Number of Visits  12    Date for PT Re-Evaluation  02/25/19    Authorization Type  [employer] medicare    Authorization Time Period  10/13-->02/25/19    Authorization - Visit Number  9    Authorization - Number of Visits  10    PT Start Time  1450    PT Stop Time  1530    PT Time Calculation (min)  40 min    Activity Tolerance  Patient tolerated treatment well    Behavior During Therapy  [facility] for tasks assessed/performed       Past Medical History:  Diagnosis Date  . Anxiety   . Arthritis   . Asthma   . Back pain   . COPD (chronic obstructive pulmonary disease) ([place])   . Depression   . DJD (degenerative joint disease), lumbosacral   . Fibromyalgia   . Hypersomnia   . IBS (irritable bowel syndrome)   . Interstitial cystitis   . Migraine   . Migraines   . OCD (obsessive compulsive disorder)   . PONV (postoperative nausea and vomiting)   . PTSD (post-traumatic stress disorder)   . Restless leg syndrome     Past Surgical History:  Procedure Laterality Date  . ABDOMINAL HYSTERECTOMY    . ABDOMINAL SURGERY    . CATARACT EXTRACTION W/PHACO Right 11/10/2014   Procedure: CATARACT EXTRACTION PHACO AND INTRAOCULAR LENS PLACEMENT (IOC);  Surgeon: Rutherford Guys, MD;  Location: [facility];  Service: Ophthalmology;  Laterality: Right;  CDE 2.04  . CATARACT EXTRACTION W/PHACO Left 11/24/2014   Procedure: CATARACT EXTRACTION PHACO AND INTRAOCULAR LENS PLACEMENT (IOC);  Surgeon: Rutherford Guys, MD;  Location: [facility];  Service: Ophthalmology;  Laterality: Left;  CDE:1.04  . EXPLORATORY LAPAROTOMY     x3  . interstim medical device  implanted     in lower back-to control bladder  . MOUTH SURGERY      There were no vitals filed for this visit.                    [facility] Adult PT Treatment/Exercise - 02/13/19 1505      Exercises   Exercises  Lumbar      Lumbar Exercises: Machines for Strengthening   Leg Press  4PL x 15       Lumbar Exercises: Standing   Functional Squats  15 reps    Functional Squats Limitations  3D hip excursion with UE for thoracic mobility; squats infront of chair for mechanics    Other Standing Lumbar Exercises  vector stance 2x 5" BLE intermittent HHA    Other Standing Lumbar Exercises  Paloff x 10 4 sets with GTB      Lumbar Exercises: Seated   Sit to Stand  10 reps      Lumbar Exercises: Supine   Isometric Hip Flexion  15 reps;5 seconds    Isometric Hip Flexion Limitations  cueing for breathing    Large Ball Oblique Isometric  10 reps;5 seconds    Other Supine Lumbar Exercises  toe tap x 10      Lumbar  Exercises: Quadruped   Madcat/Old Horse  10 reps    Single Arm Raise  10 reps    Straight Leg Raise  5 reps;10 reps    Opposite Arm/Leg Raise  10 reps             PT Education - 02/13/19 1534    Education Details  encourged to wear more supportive shoes with closed back.  continue HEP daily.    Person(s) Educated  Patient    Methods  Explanation    Comprehension  Verbalized understanding       PT Short Term Goals - 02/04/19 1138      PT SHORT TERM GOAL #1   Title  PT to be I in HEp to improve posture to decrease pain to no greater than 5/10.    Time  3    Period  Weeks    Status  On-going    Target Date  02/04/19      PT SHORT TERM GOAL #2   Title  PT to be able to extend her back to 10 degress to reduce stress on the lumbar spine    Time  3    Period  Weeks    Status  Achieved      PT SHORT TERM GOAL #3   Title  PT to be able to single leg stance on both LE for at least 20" to decrease risk of falling.    Status  Achieved        PT Long  Term Goals - 02/04/19 1140      PT LONG TERM GOAL #1   Title  PT back pain to be no greater than a 4/10 to allow pt to stand for 15 minutes to make a small meal.    Time  6    Period  Weeks    Status  On-going      PT LONG TERM GOAL #2   Title  PT to be completing a walking program and be up to walking 15 minutes to allow pt to feel confident in completing short shopping trips.    Time  6    Period  Weeks    Status  Not Met      PT LONG TERM GOAL #3   Title  PT to be able to single leg stance x 35 seconds to allow pt to feel confident walking on uneven terrain.    Time  6    Period  Weeks    Status  On-going      PT LONG TERM GOAL #4   Title  PT to have no radicular sx to demonstrate decreased stress on nerve root.    Time  6    Period  Weeks    Status  On-going            Plan - 02/13/19 1530    Clinical Impression Statement  continued with exercise progression.  Contiued needs for cues with squats and general posture with therex.  No pain reported during session, however with noted fatigue and sweat at end of session.  Overall improving with less radiating symptoms.  compliance reported with therex at home as well with decreased pain.    Personal Factors and Comorbidities  Comorbidity 3+;Fitness;Past/Current Experience;Social Background;Time since onset of injury/illness/exacerbation    Comorbidities  fibromyalgia, OA, COPD, asthma    Examination-Activity Limitations  Bed Mobility;Bend;Carry;Dressing;Lift;Locomotion Level;Sleep;Squat;Stairs;Stand    Examination-Participation Restrictions  Cleaning;Community Activity;Laundry;Shop    Stability/Clinical Decision Making  Stable/Uncomplicated    Rehab Potential  Fair    PT Frequency  2x / week    PT Duration  6 weeks    PT Treatment/Interventions  ADLs/Self Care Home Management;Patient/family education;Manual techniques;Gait training;Functional mobility training;Therapeutic activities;Therapeutic exercise;Balance  training;Stair training    PT Next Visit Plan  continue to work on core strengthening and improving thoracic/lumbar mobility and posture.  Begin balance activities next session.    PT Home Exercise Plan  sitting tall, bridge, knee to chest.; theraband decompression exercise       Patient will benefit from skilled therapeutic intervention in order to improve the following deficits and impairments:  Abnormal gait, Cardiopulmonary status limiting activity, Decreased activity tolerance, Decreased balance, Decreased strength, Difficulty walking, Postural dysfunction, Improper body mechanics, Pain  Visit Diagnosis: Radiculopathy, lumbar region  Repeated falls     Problem List There are no active problems to display for this patient.  Teena Irani, PTA/CLT [phone]  Teena Irani 02/13/2019, 3:34 PM  [place], [place], [address] Phone: [phone]   Fax:  [phone]  Name: Alexis Harris MRN: [id number] Date of Birth: [birth year]

## 2019-02-17 ENCOUNTER — Other Ambulatory Visit: Payer: Self-pay

## 2019-02-17 ENCOUNTER — Encounter (HOSPITAL_COMMUNITY): Payer: Self-pay | Admitting: Physical Therapy

## 2019-02-17 ENCOUNTER — Ambulatory Visit (HOSPITAL_COMMUNITY): Payer: Medicare Other | Admitting: Physical Therapy

## 2019-02-17 DIAGNOSIS — M5416 Radiculopathy, lumbar region: Secondary | ICD-10-CM

## 2019-02-17 DIAGNOSIS — R296 Repeated falls: Secondary | ICD-10-CM

## 2019-02-17 NOTE — Therapy (Addendum)
[place], [place], [address] Phone: [phone]   Fax:  [phone]  Physical Therapy Treatment  Patient Details  Name: Alexis Harris MRN: [id number] Date of Birth: [birth year] Referring Provider (PT): Creig Hines    Encounter Date: 02/17/2019  PT End of Session - 02/17/19 7544    Visit Number  10    Number of Visits  12    Date for PT Re-Evaluation  02/25/19    Authorization Type  [employer] medicare    Authorization Time Period  10/13-->02/25/19    Authorization - Visit Number  10    Authorization - Number of Visits  12    PT Start Time  1450    PT Stop Time  1530    PT Time Calculation (min)  40 min    Activity Tolerance  Patient tolerated treatment well    Behavior During Therapy  [facility] for tasks assessed/performed       Past Medical History:  Diagnosis Date  . Anxiety   . Arthritis   . Asthma   . Back pain   . COPD (chronic obstructive pulmonary disease) ([place])   . Depression   . DJD (degenerative joint disease), lumbosacral   . Fibromyalgia   . Hypersomnia   . IBS (irritable bowel syndrome)   . Interstitial cystitis   . Migraine   . Migraines   . OCD (obsessive compulsive disorder)   . PONV (postoperative nausea and vomiting)   . PTSD (post-traumatic stress disorder)   . Restless leg syndrome     Past Surgical History:  Procedure Laterality Date  . ABDOMINAL HYSTERECTOMY    . ABDOMINAL SURGERY    . CATARACT EXTRACTION W/PHACO Right 11/10/2014   Procedure: CATARACT EXTRACTION PHACO AND INTRAOCULAR LENS PLACEMENT (IOC);  Surgeon: Rutherford Guys, MD;  Location: [facility];  Service: Ophthalmology;  Laterality: Right;  CDE 2.04  . CATARACT EXTRACTION W/PHACO Left 11/24/2014   Procedure: CATARACT EXTRACTION PHACO AND INTRAOCULAR LENS PLACEMENT (IOC);  Surgeon: Rutherford Guys, MD;  Location: [facility];  Service: Ophthalmology;  Laterality: Left;  CDE:1.04  . EXPLORATORY LAPAROTOMY     x3  . interstim medical  device implanted     in lower back-to control bladder  . MOUTH SURGERY      There were no vitals filed for this visit.  Subjective Assessment - 02/17/19 1458    Subjective  PT states that she can tell that her posture is better.  She feels like she can sit in one place for a longer period of time.    Pertinent History  OA, COPD, chronic back pain, Migrains, circulation problems,fibromyalgia    Limitations  Standing    How long can you sit comfortably?  If she can shift postitions every 45 minutes was  30 minutes.    How long can you stand comfortably?  Able to stand for 10 minutes now was less than five minutes    How long can you walk comfortably?  PT uses a cane, she has not started any walking program.  Able to walk for 15 minutes    Currently in Pain?  Yes    Pain Score  5     Pain Location  Back    Pain Orientation  Lower    Pain Descriptors / Indicators  Aching    Pain Type  Chronic pain    Pain Onset  More than a month ago    Aggravating Factors  weight bearing    Pain Relieving Factors  stretching / TENs    Effect of Pain on Daily Activities  limits         [facility] PT Assessment - 02/17/19 0001      Assessment   Medical Diagnosis  LBP    Referring Provider (PT)  Creig Hines     Onset Date/Surgical Date  10/30/18    Next MD Visit  05/14/2019    Prior Therapy  none      Precautions   Precautions  None      Restrictions   Weight Bearing Restrictions  No      Home Environment   Living Environment  Private residence      Prior Function   Level of Independence  Independent with household mobility with device      Cognition   Overall Cognitive Status  Within Functional Limits for tasks assessed      Observation/Other Assessments   Focus on Therapeutic Outcomes (FOTO)   44 was 39      Functional Tests   Functional tests  Single leg stance;Sit to Stand      Single Leg Stance   Comments  RT:29 was   15;  LT : 50" was  6"      Sit to Stand   Comments  12 in 30  " was 10 in 30 seconds       Posture/Postural Control   Posture/Postural Control  Postural limitations    Postural Limitations  Rounded Shoulders;Decreased lumbar lordosis;Decreased thoracic kyphosis;Anterior pelvic tilt;Flexed trunk;Weight shift left      AROM   Lumbar Flexion  wfl   going down improved coming up increased    Lumbar Extension  15 was 0   increase pain     Strength   Right Hip Flexion  5/5    Right Hip Extension  5/5   was 4/5    Right Hip ABduction  5/5    Left Hip Flexion  5/5    Left Hip Extension  4/5   was 3/5    Left Hip ABduction  5/5    Right Knee Extension  5/5    Left Knee Extension  5/5    Right Ankle Dorsiflexion  5/5    Right Ankle Plantar Flexion  5/5    Left Ankle Dorsiflexion  5/5    Left Ankle Plantar Flexion  5/5      Flexibility   Soft Tissue Assessment /Muscle Length  yes    Hamstrings  B 175      Ambulation/Gait   Ambulation Distance (Feet)  426 Feet    Assistive device  Small based quad cane                   [facility] Adult PT Treatment/Exercise - 02/17/19 0001      Exercises   Exercises  Lumbar      Lumbar Exercises: Standing   Functional Squats Limitations  3D hip excursion with UE for thoracic mobility; squats infront of chair for mechanics      Lumbar Exercises: Seated   Long Arc Quad on [place]  Both;10 reps    Hip Flexion on Ball  Both;10 reps    Sit to Stand  15 reps      Lumbar Exercises: Supine   Large Ball Abdominal Isometric  15 reps    Large Ball Oblique Isometric  5 seconds;15 reps      Lumbar Exercises: Prone   Straight  Leg Raise  10 reps               PT Short Term Goals - 02/17/19 1528      PT SHORT TERM GOAL #1   Title  PT to be I in HEp to improve posture to decrease pain to no greater than 5/10.    Time  3    Period  Weeks    Status  Achieved    Target Date  02/04/19      PT SHORT TERM GOAL #2   Title  PT to be able to extend her back to 10 degress to reduce stress on the  lumbar spine    Time  3    Period  Weeks    Status  Achieved      PT SHORT TERM GOAL #3   Title  PT to be able to single leg stance on both LE for at least 20" to decrease risk of falling.    Status  Achieved        PT Long Term Goals - 02/17/19 1529      PT LONG TERM GOAL #1   Title  PT back pain to be no greater than a 4/10 to allow pt to stand for 15 minutes to make a small meal.    Time  6    Period  Weeks    Status  On-going      PT LONG TERM GOAL #2   Title  PT to be completing a walking program and be up to walking 15 minutes to allow pt to feel confident in completing short shopping trips.    Time  6    Period  Weeks    Status  Not Met      PT LONG TERM GOAL #3   Title  PT to be able to single leg stance x 35 seconds to allow pt to feel confident walking on uneven terrain.    Time  6    Period  Weeks    Status  Partially Met      PT LONG TERM GOAL #4   Title  PT to have no radicular sx to demonstrate decreased stress on nerve root.    Time  6    Period  Weeks    Status  Achieved      Assessment:   10th progress note completed with noted improvement in all aspects. Lt hip extension remains weak. PT has increased pain with vacuuming ; PT last two visits will concentrate on high level core exercises ie lunging keeping back stabilized and improving overall posture.     Plan - 02/17/19 1528    Personal Factors and Comorbidities  Comorbidity 3+;Fitness;Past/Current Experience;Social Background;Time since onset of injury/illness/exacerbation    Comorbidities  fibromyalgia, OA, COPD, asthma    Examination-Activity Limitations  Bed Mobility;Bend;Carry;Dressing;Lift;Locomotion Level;Sleep;Squat;Stairs;Stand    Examination-Participation Restrictions  Cleaning;Community Activity;Laundry;Shop    Stability/Clinical Decision Making  Stable/Uncomplicated    Rehab Potential  Fair    PT Frequency  2x / week    PT Duration  6 weeks    PT Treatment/Interventions  ADLs/Self  Care Home Management;Patient/family education;Manual techniques;Gait training;Functional mobility training;Therapeutic activities;Therapeutic exercise;Balance training;Stair training    PT Next Visit Plan  continue to work on core strengthening and improving thoracic/lumbar mobility and posture.    PT Home Exercise Plan  sitting tall, bridge, knee to chest.; theraband decompression exercise       Patient will benefit from skilled therapeutic  intervention in order to improve the following deficits and impairments:  Abnormal gait, Cardiopulmonary status limiting activity, Decreased activity tolerance, Decreased balance, Decreased strength, Difficulty walking, Postural dysfunction, Improper body mechanics, Pain  Visit Diagnosis: Repeated falls  Radiculopathy, lumbar region     Problem List There are no active problems to display for this patient.  Rayetta Humphrey, PT CLT [phone] 02/17/2019, 3:36 PM  [place] [address] [place], [place], [address] Phone: [phone]   Fax:  [phone]  Name: Alexis Harris MRN: [id number] Date of Birth: 08/10/1970

## 2019-02-19 ENCOUNTER — Encounter (HOSPITAL_COMMUNITY): Payer: Self-pay

## 2019-02-19 ENCOUNTER — Other Ambulatory Visit: Payer: Self-pay

## 2019-02-19 ENCOUNTER — Ambulatory Visit (HOSPITAL_COMMUNITY): Payer: Medicare Other

## 2019-02-19 DIAGNOSIS — M5416 Radiculopathy, lumbar region: Secondary | ICD-10-CM | POA: Diagnosis not present

## 2019-02-19 DIAGNOSIS — R296 Repeated falls: Secondary | ICD-10-CM

## 2019-02-19 NOTE — Therapy (Signed)
[place], [place], [address] Phone: [phone]   Fax:  [phone]  Physical Therapy Treatment  Patient Details  Name: Alexis Harris MRN: [id number] Date of Birth: October 30, 1970 Referring Provider (PT): Creig Hines    Encounter Date: 02/19/2019  PT End of Session - 02/19/19 1447    Visit Number  11    Number of Visits  12    Date for PT Re-Evaluation  02/25/19    Authorization Type  [employer] medicare    Authorization Time Period  10/13-->02/25/19    Authorization - Visit Number  11    Authorization - Number of Visits  12    PT Start Time  3976    PT Stop Time  1527    PT Time Calculation (min)  45 min    Activity Tolerance  Patient tolerated treatment well    Behavior During Therapy  [facility] for tasks assessed/performed       Past Medical History:  Diagnosis Date  . Anxiety   . Arthritis   . Asthma   . Back pain   . COPD (chronic obstructive pulmonary disease) ([place])   . Depression   . DJD (degenerative joint disease), lumbosacral   . Fibromyalgia   . Hypersomnia   . IBS (irritable bowel syndrome)   . Interstitial cystitis   . Migraine   . Migraines   . OCD (obsessive compulsive disorder)   . PONV (postoperative nausea and vomiting)   . PTSD (post-traumatic stress disorder)   . Restless leg syndrome     Past Surgical History:  Procedure Laterality Date  . ABDOMINAL HYSTERECTOMY    . ABDOMINAL SURGERY    . CATARACT EXTRACTION W/PHACO Right 11/10/2014   Procedure: CATARACT EXTRACTION PHACO AND INTRAOCULAR LENS PLACEMENT (IOC);  Surgeon: Rutherford Guys, MD;  Location: [facility];  Service: Ophthalmology;  Laterality: Right;  CDE 2.04  . CATARACT EXTRACTION W/PHACO Left 11/24/2014   Procedure: CATARACT EXTRACTION PHACO AND INTRAOCULAR LENS PLACEMENT (IOC);  Surgeon: Rutherford Guys, MD;  Location: [facility];  Service: Ophthalmology;  Laterality: Left;  CDE:1.04  . EXPLORATORY LAPAROTOMY     x3  . interstim medical  device implanted     in lower back-to control bladder  . MOUTH SURGERY      There were no vitals filed for this visit.  Subjective Assessment - 02/19/19 1444    Subjective  Reports intermittent throbbing pain lower back, pain scale 5/10.  Feels her posture is better.  No reports of radicular thigh pain in over a week    Pertinent History  OA, COPD, chronic back pain, Migrains, circulation problems,fibromyalgia    Currently in Pain?  Yes    Pain Score  5     Pain Location  Back    Pain Orientation  Lower    Pain Descriptors / Indicators  Aching;Throbbing    Pain Radiating Towards  no radiating pain in over a week    Pain Onset  More than a month ago    Pain Frequency  Intermittent    Aggravating Factors   weight bearing    Pain Relieving Factors  stretching/TENS    Effect of Pain on Daily Activities  limits                       [facility] Adult PT Treatment/Exercise - 02/19/19 0001      Posture/Postural Control   Posture/Postural Control  Postural limitations  Postural Limitations  Rounded Shoulders;Decreased lumbar lordosis;Decreased thoracic kyphosis;Anterior pelvic tilt;Flexed trunk;Weight shift left      Exercises   Exercises  Lumbar      Lumbar Exercises: Stretches   Standing Extension  10 reps    Standing Extension Limitations  3D hip excursion      Lumbar Exercises: Standing   Functional Squats Limitations  3D hip excursion with UE for thoracic mobility; squats infront of chair for mechanics    Forward Lunge  10 reps    Other Standing Lumbar Exercises  vector stance 2x 10" BLE intermittent HHA    Other Standing Lumbar Exercises  Paloff x 10 4 sets with GTB on foam      Lumbar Exercises: Supine   Large Ball Abdominal Isometric  15 reps;5 seconds    Large Ball Abdominal Isometric Limitations       Large Ball Oblique Isometric  5 seconds;15 reps      Lumbar Exercises: Quadruped   [place]  10 reps    [place] Horse Limitations  10" holds     Opposite Arm/Leg Raise  Right arm/Left leg;Left arm/Right leg;10 reps;5 seconds    Other Quadruped Lumbar Exercises  thoracic rotation               PT Short Term Goals - 02/17/19 1528      PT SHORT TERM GOAL #1   Title  PT to be I in HEp to improve posture to decrease pain to no greater than 5/10.    Time  3    Period  Weeks    Status  Achieved    Target Date  02/04/19      PT SHORT TERM GOAL #2   Title  PT to be able to extend her back to 10 degress to reduce stress on the lumbar spine    Time  3    Period  Weeks    Status  Achieved      PT SHORT TERM GOAL #3   Title  PT to be able to single leg stance on both LE for at least 20" to decrease risk of falling.    Status  Achieved        PT Long Term Goals - 02/17/19 1529      PT LONG TERM GOAL #1   Title  PT back pain to be no greater than a 4/10 to allow pt to stand for 15 minutes to make a small meal.    Time  6    Period  Weeks    Status  On-going      PT LONG TERM GOAL #2   Title  PT to be completing a walking program and be up to walking 15 minutes to allow pt to feel confident in completing short shopping trips.    Time  6    Period  Weeks    Status  Not Met      PT LONG TERM GOAL #3   Title  PT to be able to single leg stance x 35 seconds to allow pt to feel confident walking on uneven terrain.    Time  6    Period  Weeks    Status  Partially Met      PT LONG TERM GOAL #4   Title  PT to have no radicular sx to demonstrate decreased stress on nerve root.    Time  6    Period  Weeks    Status  Achieved  Plan - 02/19/19 1537    Clinical Impression Statement  Session focus on core stability and hip strengthening.  Added forward lunges and discussed form to improve vaccumming for pain control, verbal cueing for core activation and postural assistance.  Pt able to complete whole session wiht no reports of pain, was limited by fatigue.    Personal Factors and Comorbidities  Comorbidity  3+;Fitness;Past/Current Experience;Social Background;Time since onset of injury/illness/exacerbation    Comorbidities  fibromyalgia, OA, COPD, asthma    Examination-Activity Limitations  Bed Mobility;Bend;Carry;Dressing;Lift;Locomotion Level;Sleep;Squat;Stairs;Stand    Examination-Participation Restrictions  Cleaning;Community Activity;Laundry;Shop    Stability/Clinical Decision Making  Stable/Uncomplicated    Clinical Decision Making  Low    Rehab Potential  Fair    PT Frequency  2x / week    PT Duration  6 weeks    PT Treatment/Interventions  ADLs/Self Care Home Management;Patient/family education;Manual techniques;Gait training;Functional mobility training;Therapeutic activities;Therapeutic exercise;Balance training;Stair training    PT Next Visit Plan  Pt to continue PT for 1 more session.  Continue core and functional strengthening.  Review form with lunges/vaccumming mechanics.    PT Home Exercise Plan  sitting tall, bridge, knee to chest.; theraband decompression exercise       Patient will benefit from skilled therapeutic intervention in order to improve the following deficits and impairments:  Abnormal gait, Cardiopulmonary status limiting activity, Decreased activity tolerance, Decreased balance, Decreased strength, Difficulty walking, Postural dysfunction, Improper body mechanics, Pain  Visit Diagnosis: Repeated falls  Radiculopathy, lumbar region     Problem List There are no active problems to display for this patient.  [address], LPTA; [place]  Aldona Lento 02/19/2019, 3:42 PM  [place] [address] [place], [place], [address] Phone: [phone]   Fax:  [phone]  Name: Alexis Harris MRN: [id number] Date of Birth: February 17, 1971

## 2019-02-24 ENCOUNTER — Other Ambulatory Visit: Payer: Self-pay

## 2019-02-24 ENCOUNTER — Ambulatory Visit (HOSPITAL_COMMUNITY): Payer: Medicare Other | Admitting: Physical Therapy

## 2019-02-24 DIAGNOSIS — M5416 Radiculopathy, lumbar region: Secondary | ICD-10-CM

## 2019-02-24 DIAGNOSIS — R296 Repeated falls: Secondary | ICD-10-CM | POA: Diagnosis not present

## 2019-02-24 DIAGNOSIS — J449 Chronic obstructive pulmonary disease, unspecified: Secondary | ICD-10-CM | POA: Diagnosis not present

## 2019-02-24 DIAGNOSIS — K219 Gastro-esophageal reflux disease without esophagitis: Secondary | ICD-10-CM | POA: Diagnosis not present

## 2019-02-24 NOTE — Patient Instructions (Addendum)
On Elbows (Prone)    Rise up on elbows as high as possible, keeping hips on floor. Hold _5___ seconds. Repeat _10___ times per set. Do _1___ sets per session. Do ___2_ sessions per day.  http://orth.exer.us/92   Copyright  VHI. All rights reserved.  Press-Up    Press upper body upward, keeping hips in contact with floor. Keep lower back and buttocks relaxed. Hold __3__ seconds. Repeat _5___ times per set. Do 1____ sets per session. Do _2___ sessions per day.  http://orth.exer.us/94   Copyright  VHI. All rights reserved.

## 2019-02-24 NOTE — Therapy (Signed)
[facility] [address] [place], [place], [address] Phone: [phone]   Fax:  [phone]  Physical Therapy Treatment  Patient Details  Name: RASHAWN ROLON MRN: [id number] Date of Birth: [birth year] Referring Provider (PT): Creig Hines  PHYSICAL THERAPY DISCHARGE SUMMARY  Visits from Start of Care: 12  Current functional level related to goals / functional outcomes: See below   Remaining deficits: See below    Education / Equipment: HEP Plan: Patient agrees to discharge.  Patient goals were not met. [name] being discharged due to meeting the stated rehab goals.  ?????      Encounter Date: 02/24/2019  PT End of Session - 02/24/19 1451    Visit Number  12    Number of Visits  12    Date for PT Re-Evaluation  02/25/19    Authorization Type  [employer] medicare    Authorization Time Period  10/13-->02/25/19    Authorization - Visit Number  12    Authorization - Number of Visits  12    PT Start Time  1450    PT Stop Time  1530    PT Time Calculation (min)  40 min    Activity Tolerance  Patient tolerated treatment well    Behavior During Therapy  WFL for tasks assessed/performed       Past Medical History:  Diagnosis Date  . Anxiety   . Arthritis   . Asthma   . Back pain   . COPD (chronic obstructive pulmonary disease) ([place])   . Depression   . DJD (degenerative joint disease), lumbosacral   . Fibromyalgia   . Hypersomnia   . IBS (irritable bowel syndrome)   . Interstitial cystitis   . Migraine   . Migraines   . OCD (obsessive compulsive disorder)   . PONV (postoperative nausea and vomiting)   . PTSD (post-traumatic stress disorder)   . Restless leg syndrome     Past Surgical History:  Procedure Laterality Date  . ABDOMINAL HYSTERECTOMY    . ABDOMINAL SURGERY    . CATARACT EXTRACTION W/PHACO Right 11/10/2014   Procedure: CATARACT EXTRACTION PHACO AND INTRAOCULAR LENS PLACEMENT (IOC);  Surgeon: Rutherford Guys, MD;   Location: [facility];  Service: Ophthalmology;  Laterality: Right;  CDE 2.04  . CATARACT EXTRACTION W/PHACO Left 11/24/2014   Procedure: CATARACT EXTRACTION PHACO AND INTRAOCULAR LENS PLACEMENT (IOC);  Surgeon: Rutherford Guys, MD;  Location: [facility];  Service: Ophthalmology;  Laterality: Left;  CDE:1.04  . EXPLORATORY LAPAROTOMY     x3  . interstim medical device implanted     in lower back-to control bladder  . MOUTH SURGERY      There were no vitals filed for this visit.  Subjective Assessment - 02/24/19 1457    Subjective  PT states that she has been more aware of her posture and body mechanics.    Pertinent History  OA, COPD, chronic back pain, Migrains, circulation problems,fibromyalgia    Limitations  Standing    How long can you sit comfortably?  If she can shift postitions every 45 minutes was  30 minutes.    How long can you stand comfortably?  Able to stand for 10 minutes now was less than five minutes    How long can you walk comfortably?  PT uses a cane, she has not started any walking program.  Able to walk for 15 minutes    Pain Score  5     Pain Location  Back  Pain Orientation  Lower    Pain Descriptors / Indicators  Aching    Pain Type  Chronic pain    Pain Onset  More than a month ago    Pain Frequency  Intermittent    Aggravating Factors   WB    Pain Relieving Factors  rest    Effect of Pain on Daily Activities  limits         [facility] PT Assessment - 02/24/19 0001      Assessment   Medical Diagnosis  LBP    Referring Provider (PT)  Creig Hines     Onset Date/Surgical Date  10/30/18    Prior Therapy  none      Precautions   Precautions  None      Restrictions   Weight Bearing Restrictions  No      Home Environment   Living Environment  Private residence      Prior Function   Level of Independence  Independent with household mobility with device      Cognition   Overall Cognitive Status  Within Functional Limits for tasks assessed      Observation/Other  Assessments   Focus on Therapeutic Outcomes (FOTO)   39      Functional Tests   Functional tests  Single leg stance;Sit to Stand      Single Leg Stance   Comments  RT:  15;  LT :  6"      Sit to Stand   Comments  10 in 30 seconds       AROM   Lumbar Flexion  wfl   going down improved coming up increased    Lumbar Extension  0   increase pain     Strength   Right Hip Flexion  5/5    Right Hip Extension  4/5    Right Hip ABduction  5/5    Left Hip Flexion  5/5    Left Hip Extension  3/5    Left Hip ABduction  5/5    Right Knee Extension  5/5    Left Knee Extension  5/5    Right Ankle Dorsiflexion  5/5    Right Ankle Plantar Flexion  5/5    Left Ankle Dorsiflexion  5/5    Left Ankle Plantar Flexion  5/5      Flexibility   Soft Tissue Assessment /Muscle Length  yes    Hamstrings  B 175      Ambulation/Gait   Ambulation Distance (Feet)  426 Feet    Assistive device  Small based quad cane                   [facility] Adult PT Treatment/Exercise - 02/24/19 0001      Posture/Postural Control   Posture/Postural Control  Postural limitations    Postural Limitations  Rounded Shoulders;Decreased lumbar lordosis;Decreased thoracic kyphosis;Anterior pelvic tilt;Flexed trunk;Weight shift left      Exercises   Exercises  Lumbar      Lumbar Exercises: Stretches   Standing Extension  10 reps    Standing Extension Limitations  3D hip excursion    Figure 4 Stretch  20 seconds;3 reps      Lumbar Exercises: Standing   Heel Raises  15 reps    Functional Squats Limitations  --    Forward Lunge  10 reps;20 reps   mimicing vacuuming    Other Standing Lumbar Exercises  vector stance 2x 10" BLE intermittent HHA    Other  Standing Lumbar Exercises  Paloff x 10 4 sets with GTB on foam      Lumbar Exercises: Seated   Sit to Stand  15 reps      Lumbar Exercises: Supine   Large Ball Abdominal Isometric  15 reps;5 seconds    Large Ball Abdominal Isometric Limitations        Large Ball Oblique Isometric  5 seconds;15 reps      Lumbar Exercises: Quadruped   Madcat/Old Horse  10 reps    Madcat/Old Horse Limitations  10" holds    Opposite Arm/Leg Raise  Right arm/Left leg;Left arm/Right leg;10 reps;5 seconds    Other Quadruped Lumbar Exercises  thoracic rotation             PT Education - 02/24/19 1532    Education Details  HEP, keep posture in mind at all times.    Person(s) Educated  Patient    Methods  Explanation;Demonstration;Verbal cues;Handout    Comprehension  Verbalized understanding       PT Short Term Goals - 02/17/19 1528      PT SHORT TERM GOAL #1   Title  PT to be I in HEp to improve posture to decrease pain to no greater than 5/10.    Time  3    Period  Weeks    Status  Achieved    Target Date  02/04/19      PT SHORT TERM GOAL #2   Title  PT to be able to extend her back to 10 degress to reduce stress on the lumbar spine    Time  3    Period  Weeks    Status  Achieved      PT SHORT TERM GOAL #3   Title  PT to be able to single leg stance on both LE for at least 20" to decrease risk of falling.    Status  Achieved        PT Long Term Goals - 02/24/19 1534      PT LONG TERM GOAL #1   Title  PT back pain to be no greater than a 4/10 to allow pt to stand for 15 minutes to make a small meal.    Time  6    Period  Weeks    Status  On-going      PT LONG TERM GOAL #2   Title  PT to be completing a walking program and be up to walking 15 minutes to allow pt to feel confident in completing short shopping trips.    Time  6    Period  Weeks    Status  Not Met      PT LONG TERM GOAL #3   Title  PT to be able to single leg stance x 35 seconds to allow pt to feel confident walking on uneven terrain.    Time  6    Period  Weeks    Status  Partially Met      PT LONG TERM GOAL #4   Title  PT to have no radicular sx to demonstrate decreased stress on nerve root.    Time  6    Period  Weeks    Status  Achieved             Plan - 02/24/19 1506    Clinical Impression Statement  Reviewed body mechanics and posture with pt.  Added POE for HEP to improve lumbar extension.  Pt I in all exercises  and is ready to be discharged to a HEP    Personal Factors and Comorbidities  Comorbidity 3+;Fitness;Past/Current Experience;Social Background;Time since onset of injury/illness/exacerbation    Comorbidities  fibromyalgia, OA, COPD, asthma    Examination-Activity Limitations  Bed Mobility;Bend;Carry;Dressing;Lift;Locomotion Level;Sleep;Squat;Stairs;Stand    Examination-Participation Restrictions  Cleaning;Community Activity;Laundry;Shop    Stability/Clinical Decision Making  Stable/Uncomplicated    Rehab Potential  Fair    PT Frequency  2x / week    PT Duration  6 weeks    PT Treatment/Interventions  ADLs/Self Care Home Management;Patient/family education;Manual techniques;Gait training;Functional mobility training;Therapeutic activities;Therapeutic exercise;Balance training;Stair training    PT Next Visit Plan  D/C    PT Home Exercise Plan  sitting tall, bridge, knee to chest.; theraband decompression exercise       Patient will benefit from skilled therapeutic intervention in order to improve the following deficits and impairments:  Abnormal gait, Cardiopulmonary status limiting activity, Decreased activity tolerance, Decreased balance, Decreased strength, Difficulty walking, Postural dysfunction, Improper body mechanics, Pain  Visit Diagnosis: Radiculopathy, lumbar region     Problem List There are no active problems to display for this patient.   Rayetta Humphrey, PT CLT [id number] 02/24/2019, 3:35 PM  [facility] [place], [place], [address] Phone: [phone]   Fax:  [phone]  Name: GENELL THEDE MRN: [id number] Date of Birth: [birth year]

## 2019-02-25 ENCOUNTER — Ambulatory Visit (HOSPITAL_COMMUNITY): Payer: Medicare Other | Admitting: Physical Therapy

## 2019-03-07 DIAGNOSIS — N3010 Interstitial cystitis (chronic) without hematuria: Secondary | ICD-10-CM | POA: Diagnosis not present

## 2019-03-18 DIAGNOSIS — G43909 Migraine, unspecified, not intractable, without status migrainosus: Secondary | ICD-10-CM | POA: Diagnosis not present

## 2019-03-18 DIAGNOSIS — J449 Chronic obstructive pulmonary disease, unspecified: Secondary | ICD-10-CM | POA: Diagnosis not present

## 2019-03-18 DIAGNOSIS — K219 Gastro-esophageal reflux disease without esophagitis: Secondary | ICD-10-CM | POA: Diagnosis not present

## 2019-04-16 DIAGNOSIS — R093 Abnormal sputum: Secondary | ICD-10-CM | POA: Diagnosis not present

## 2019-04-16 DIAGNOSIS — G2581 Restless legs syndrome: Secondary | ICD-10-CM | POA: Diagnosis not present

## 2019-04-16 DIAGNOSIS — J449 Chronic obstructive pulmonary disease, unspecified: Secondary | ICD-10-CM | POA: Diagnosis not present

## 2019-04-16 DIAGNOSIS — R05 Cough: Secondary | ICD-10-CM | POA: Diagnosis not present

## 2019-04-18 DIAGNOSIS — K219 Gastro-esophageal reflux disease without esophagitis: Secondary | ICD-10-CM | POA: Diagnosis not present

## 2019-04-18 DIAGNOSIS — J449 Chronic obstructive pulmonary disease, unspecified: Secondary | ICD-10-CM | POA: Diagnosis not present

## 2019-04-22 ENCOUNTER — Other Ambulatory Visit: Payer: Self-pay

## 2019-04-22 ENCOUNTER — Ambulatory Visit: Payer: Medicare Other | Attending: Internal Medicine

## 2019-04-22 DIAGNOSIS — Z20822 Contact with and (suspected) exposure to covid-19: Secondary | ICD-10-CM | POA: Diagnosis not present

## 2019-04-23 LAB — NOVEL CORONAVIRUS, NAA: SARS-CoV-2, NAA: NOT DETECTED

## 2019-04-29 DIAGNOSIS — K5900 Constipation, unspecified: Secondary | ICD-10-CM | POA: Diagnosis not present

## 2019-04-29 DIAGNOSIS — R109 Unspecified abdominal pain: Secondary | ICD-10-CM | POA: Diagnosis not present

## 2019-04-29 DIAGNOSIS — Z1211 Encounter for screening for malignant neoplasm of colon: Secondary | ICD-10-CM | POA: Diagnosis not present

## 2019-04-29 DIAGNOSIS — R933 Abnormal findings on diagnostic imaging of other parts of digestive tract: Secondary | ICD-10-CM | POA: Diagnosis not present

## 2019-04-29 DIAGNOSIS — K5909 Other constipation: Secondary | ICD-10-CM | POA: Diagnosis not present

## 2019-05-19 DIAGNOSIS — K219 Gastro-esophageal reflux disease without esophagitis: Secondary | ICD-10-CM | POA: Diagnosis not present

## 2019-05-19 DIAGNOSIS — J449 Chronic obstructive pulmonary disease, unspecified: Secondary | ICD-10-CM | POA: Diagnosis not present

## 2019-05-28 DIAGNOSIS — G44229 Chronic tension-type headache, not intractable: Secondary | ICD-10-CM | POA: Diagnosis not present

## 2019-05-28 DIAGNOSIS — M48061 Spinal stenosis, lumbar region without neurogenic claudication: Secondary | ICD-10-CM | POA: Diagnosis not present

## 2019-05-28 DIAGNOSIS — M797 Fibromyalgia: Secondary | ICD-10-CM | POA: Diagnosis not present

## 2019-05-28 DIAGNOSIS — G894 Chronic pain syndrome: Secondary | ICD-10-CM | POA: Diagnosis not present

## 2019-05-28 DIAGNOSIS — M5137 Other intervertebral disc degeneration, lumbosacral region: Secondary | ICD-10-CM | POA: Diagnosis not present

## 2019-06-10 DIAGNOSIS — N3010 Interstitial cystitis (chronic) without hematuria: Secondary | ICD-10-CM | POA: Diagnosis not present

## 2019-06-10 DIAGNOSIS — R300 Dysuria: Secondary | ICD-10-CM | POA: Diagnosis not present

## 2019-06-24 DIAGNOSIS — Z0001 Encounter for general adult medical examination with abnormal findings: Secondary | ICD-10-CM | POA: Diagnosis not present

## 2019-06-24 DIAGNOSIS — F17210 Nicotine dependence, cigarettes, uncomplicated: Secondary | ICD-10-CM | POA: Diagnosis not present

## 2019-06-24 DIAGNOSIS — E785 Hyperlipidemia, unspecified: Secondary | ICD-10-CM | POA: Diagnosis not present

## 2019-06-24 DIAGNOSIS — Z1389 Encounter for screening for other disorder: Secondary | ICD-10-CM | POA: Diagnosis not present

## 2019-06-24 DIAGNOSIS — K219 Gastro-esophageal reflux disease without esophagitis: Secondary | ICD-10-CM | POA: Diagnosis not present

## 2019-06-24 DIAGNOSIS — R739 Hyperglycemia, unspecified: Secondary | ICD-10-CM | POA: Diagnosis not present

## 2019-06-24 DIAGNOSIS — J449 Chronic obstructive pulmonary disease, unspecified: Secondary | ICD-10-CM | POA: Diagnosis not present

## 2019-07-08 ENCOUNTER — Other Ambulatory Visit (HOSPITAL_COMMUNITY): Payer: Self-pay | Admitting: Internal Medicine

## 2019-07-14 ENCOUNTER — Other Ambulatory Visit (HOSPITAL_COMMUNITY): Payer: Self-pay | Admitting: Internal Medicine

## 2019-07-14 DIAGNOSIS — N63 Unspecified lump in unspecified breast: Secondary | ICD-10-CM

## 2019-07-25 DIAGNOSIS — K219 Gastro-esophageal reflux disease without esophagitis: Secondary | ICD-10-CM | POA: Diagnosis not present

## 2019-07-25 DIAGNOSIS — J449 Chronic obstructive pulmonary disease, unspecified: Secondary | ICD-10-CM | POA: Diagnosis not present

## 2019-08-05 ENCOUNTER — Ambulatory Visit (HOSPITAL_COMMUNITY)
Admission: RE | Admit: 2019-08-05 | Discharge: 2019-08-05 | Disposition: A | Discharge status: Home / Self Care | Payer: Medicare Other | Source: Ambulatory Visit | Attending: Internal Medicine | Admitting: Internal Medicine

## 2019-08-05 ENCOUNTER — Other Ambulatory Visit: Payer: Self-pay

## 2019-08-05 DIAGNOSIS — R928 Other abnormal and inconclusive findings on diagnostic imaging of breast: Secondary | ICD-10-CM | POA: Diagnosis not present

## 2019-08-05 DIAGNOSIS — N63 Unspecified lump in unspecified breast: Secondary | ICD-10-CM

## 2019-08-05 DIAGNOSIS — N6323 Unspecified lump in the left breast, lower outer quadrant: Secondary | ICD-10-CM | POA: Insufficient documentation

## 2019-08-24 DIAGNOSIS — K219 Gastro-esophageal reflux disease without esophagitis: Secondary | ICD-10-CM | POA: Diagnosis not present

## 2019-09-16 DIAGNOSIS — N3010 Interstitial cystitis (chronic) without hematuria: Secondary | ICD-10-CM | POA: Diagnosis not present

## 2019-09-24 DIAGNOSIS — K219 Gastro-esophageal reflux disease without esophagitis: Secondary | ICD-10-CM | POA: Diagnosis not present

## 2019-09-24 DIAGNOSIS — J449 Chronic obstructive pulmonary disease, unspecified: Secondary | ICD-10-CM | POA: Diagnosis not present

## 2019-10-15 DIAGNOSIS — J4530 Mild persistent asthma, uncomplicated: Secondary | ICD-10-CM | POA: Diagnosis not present

## 2019-10-15 DIAGNOSIS — J22 Unspecified acute lower respiratory infection: Secondary | ICD-10-CM | POA: Diagnosis not present

## 2019-10-24 DIAGNOSIS — K219 Gastro-esophageal reflux disease without esophagitis: Secondary | ICD-10-CM | POA: Diagnosis not present

## 2019-11-24 DIAGNOSIS — J449 Chronic obstructive pulmonary disease, unspecified: Secondary | ICD-10-CM | POA: Diagnosis not present

## 2019-11-25 DIAGNOSIS — M48061 Spinal stenosis, lumbar region without neurogenic claudication: Secondary | ICD-10-CM | POA: Diagnosis not present

## 2019-11-25 DIAGNOSIS — G44229 Chronic tension-type headache, not intractable: Secondary | ICD-10-CM | POA: Diagnosis not present

## 2019-11-25 DIAGNOSIS — M797 Fibromyalgia: Secondary | ICD-10-CM | POA: Diagnosis not present

## 2019-12-09 DIAGNOSIS — F17210 Nicotine dependence, cigarettes, uncomplicated: Secondary | ICD-10-CM | POA: Diagnosis not present

## 2019-12-09 DIAGNOSIS — J449 Chronic obstructive pulmonary disease, unspecified: Secondary | ICD-10-CM | POA: Diagnosis not present

## 2019-12-09 DIAGNOSIS — F17200 Nicotine dependence, unspecified, uncomplicated: Secondary | ICD-10-CM | POA: Diagnosis not present

## 2019-12-09 DIAGNOSIS — K219 Gastro-esophageal reflux disease without esophagitis: Secondary | ICD-10-CM | POA: Diagnosis not present

## 2020-01-13 DIAGNOSIS — K219 Gastro-esophageal reflux disease without esophagitis: Secondary | ICD-10-CM | POA: Diagnosis not present

## 2020-01-13 DIAGNOSIS — J449 Chronic obstructive pulmonary disease, unspecified: Secondary | ICD-10-CM | POA: Diagnosis not present

## 2020-01-27 DIAGNOSIS — N3010 Interstitial cystitis (chronic) without hematuria: Secondary | ICD-10-CM | POA: Diagnosis not present

## 2020-02-13 DIAGNOSIS — K219 Gastro-esophageal reflux disease without esophagitis: Secondary | ICD-10-CM | POA: Diagnosis not present

## 2020-03-14 DIAGNOSIS — J449 Chronic obstructive pulmonary disease, unspecified: Secondary | ICD-10-CM | POA: Diagnosis not present

## 2020-03-14 DIAGNOSIS — K219 Gastro-esophageal reflux disease without esophagitis: Secondary | ICD-10-CM | POA: Diagnosis not present

## 2020-03-16 DIAGNOSIS — N3592 Unspecified urethral stricture, female: Secondary | ICD-10-CM | POA: Diagnosis not present

## 2020-04-14 DIAGNOSIS — R059 Cough, unspecified: Secondary | ICD-10-CM | POA: Diagnosis not present

## 2020-04-14 DIAGNOSIS — K219 Gastro-esophageal reflux disease without esophagitis: Secondary | ICD-10-CM | POA: Diagnosis not present

## 2020-04-14 DIAGNOSIS — J22 Unspecified acute lower respiratory infection: Secondary | ICD-10-CM | POA: Diagnosis not present

## 2020-04-14 DIAGNOSIS — J449 Chronic obstructive pulmonary disease, unspecified: Secondary | ICD-10-CM | POA: Diagnosis not present

## 2020-04-14 DIAGNOSIS — F17200 Nicotine dependence, unspecified, uncomplicated: Secondary | ICD-10-CM | POA: Diagnosis not present

## 2020-04-14 DIAGNOSIS — R058 Other specified cough: Secondary | ICD-10-CM | POA: Diagnosis not present

## 2020-04-30 ENCOUNTER — Other Ambulatory Visit: Payer: Medicare Other

## 2020-04-30 DIAGNOSIS — Z20822 Contact with and (suspected) exposure to covid-19: Secondary | ICD-10-CM | POA: Diagnosis not present

## 2020-05-01 LAB — SARS-COV-2, NAA 2 DAY TAT

## 2020-05-01 LAB — NOVEL CORONAVIRUS, NAA: SARS-CoV-2, NAA: NOT DETECTED

## 2020-05-15 DIAGNOSIS — J449 Chronic obstructive pulmonary disease, unspecified: Secondary | ICD-10-CM | POA: Diagnosis not present

## 2020-05-15 DIAGNOSIS — F17200 Nicotine dependence, unspecified, uncomplicated: Secondary | ICD-10-CM | POA: Diagnosis not present

## 2020-05-27 DIAGNOSIS — M797 Fibromyalgia: Secondary | ICD-10-CM | POA: Diagnosis not present

## 2020-05-27 DIAGNOSIS — M48061 Spinal stenosis, lumbar region without neurogenic claudication: Secondary | ICD-10-CM | POA: Diagnosis not present

## 2020-05-27 DIAGNOSIS — M5137 Other intervertebral disc degeneration, lumbosacral region: Secondary | ICD-10-CM | POA: Diagnosis not present

## 2020-05-27 DIAGNOSIS — G44229 Chronic tension-type headache, not intractable: Secondary | ICD-10-CM | POA: Diagnosis not present

## 2020-05-27 DIAGNOSIS — G4710 Hypersomnia, unspecified: Secondary | ICD-10-CM | POA: Diagnosis not present

## 2020-06-15 DIAGNOSIS — Z1389 Encounter for screening for other disorder: Secondary | ICD-10-CM | POA: Diagnosis not present

## 2020-06-15 DIAGNOSIS — Z0001 Encounter for general adult medical examination with abnormal findings: Secondary | ICD-10-CM | POA: Diagnosis not present

## 2020-06-15 DIAGNOSIS — Z79899 Other long term (current) drug therapy: Secondary | ICD-10-CM | POA: Diagnosis not present

## 2020-06-15 DIAGNOSIS — F17210 Nicotine dependence, cigarettes, uncomplicated: Secondary | ICD-10-CM | POA: Diagnosis not present

## 2020-06-15 DIAGNOSIS — K219 Gastro-esophageal reflux disease without esophagitis: Secondary | ICD-10-CM | POA: Diagnosis not present

## 2020-06-15 DIAGNOSIS — J449 Chronic obstructive pulmonary disease, unspecified: Secondary | ICD-10-CM | POA: Diagnosis not present

## 2020-06-16 ENCOUNTER — Other Ambulatory Visit (HOSPITAL_COMMUNITY): Payer: Self-pay | Admitting: Gerontology

## 2020-06-16 ENCOUNTER — Other Ambulatory Visit: Payer: Self-pay | Admitting: Gerontology

## 2020-06-16 DIAGNOSIS — Z87891 Personal history of nicotine dependence: Secondary | ICD-10-CM

## 2020-06-22 ENCOUNTER — Encounter: Payer: Self-pay | Admitting: Internal Medicine

## 2020-06-28 ENCOUNTER — Other Ambulatory Visit: Payer: Self-pay

## 2020-06-28 ENCOUNTER — Ambulatory Visit (HOSPITAL_COMMUNITY)
Admission: RE | Admit: 2020-06-28 | Discharge: 2020-06-28 | Disposition: A | Discharge status: Home / Self Care | Payer: Medicare Other | Source: Ambulatory Visit | Attending: Gerontology | Admitting: Gerontology

## 2020-06-28 DIAGNOSIS — J439 Emphysema, unspecified: Secondary | ICD-10-CM | POA: Insufficient documentation

## 2020-06-28 DIAGNOSIS — Z87891 Personal history of nicotine dependence: Secondary | ICD-10-CM | POA: Insufficient documentation

## 2020-06-28 DIAGNOSIS — Z122 Encounter for screening for malignant neoplasm of respiratory organs: Secondary | ICD-10-CM | POA: Insufficient documentation

## 2020-06-28 DIAGNOSIS — F17210 Nicotine dependence, cigarettes, uncomplicated: Secondary | ICD-10-CM | POA: Diagnosis not present

## 2020-07-01 DIAGNOSIS — N3010 Interstitial cystitis (chronic) without hematuria: Secondary | ICD-10-CM | POA: Diagnosis not present

## 2020-07-16 DIAGNOSIS — J449 Chronic obstructive pulmonary disease, unspecified: Secondary | ICD-10-CM | POA: Diagnosis not present

## 2020-07-29 DIAGNOSIS — K5909 Other constipation: Secondary | ICD-10-CM | POA: Diagnosis not present

## 2020-07-29 DIAGNOSIS — Z1211 Encounter for screening for malignant neoplasm of colon: Secondary | ICD-10-CM | POA: Diagnosis not present

## 2020-07-29 DIAGNOSIS — K219 Gastro-esophageal reflux disease without esophagitis: Secondary | ICD-10-CM | POA: Diagnosis not present

## 2020-07-29 DIAGNOSIS — R1310 Dysphagia, unspecified: Secondary | ICD-10-CM | POA: Diagnosis not present

## 2020-08-02 ENCOUNTER — Encounter: Payer: Medicare Other | Admitting: Adult Health

## 2020-08-03 ENCOUNTER — Ambulatory Visit: Payer: Medicare Other | Admitting: Gastroenterology

## 2020-08-05 DIAGNOSIS — J449 Chronic obstructive pulmonary disease, unspecified: Secondary | ICD-10-CM | POA: Diagnosis not present

## 2020-08-05 DIAGNOSIS — Z79899 Other long term (current) drug therapy: Secondary | ICD-10-CM | POA: Diagnosis not present

## 2020-08-05 DIAGNOSIS — K219 Gastro-esophageal reflux disease without esophagitis: Secondary | ICD-10-CM | POA: Diagnosis not present

## 2020-08-05 DIAGNOSIS — R739 Hyperglycemia, unspecified: Secondary | ICD-10-CM | POA: Diagnosis not present

## 2020-08-05 DIAGNOSIS — E119 Type 2 diabetes mellitus without complications: Secondary | ICD-10-CM | POA: Diagnosis not present

## 2020-08-25 ENCOUNTER — Encounter: Payer: Self-pay | Admitting: Adult Health

## 2020-08-25 ENCOUNTER — Ambulatory Visit (INDEPENDENT_AMBULATORY_CARE_PROVIDER_SITE_OTHER): Payer: Medicare Other | Admitting: Adult Health

## 2020-08-25 ENCOUNTER — Other Ambulatory Visit (HOSPITAL_COMMUNITY)
Admission: RE | Admit: 2020-08-25 | Discharge: 2020-08-25 | Disposition: A | Discharge status: Home / Self Care | Payer: Medicare Other | Source: Ambulatory Visit | Attending: Adult Health | Admitting: Adult Health

## 2020-08-25 ENCOUNTER — Other Ambulatory Visit: Payer: Self-pay

## 2020-08-25 VITALS — BP 139/88 | HR 101 | Ht 63.0 in | Wt 200.5 lb

## 2020-08-25 DIAGNOSIS — Z113 Encounter for screening for infections with a predominantly sexual mode of transmission: Secondary | ICD-10-CM | POA: Insufficient documentation

## 2020-08-25 DIAGNOSIS — Z01419 Encounter for gynecological examination (general) (routine) without abnormal findings: Secondary | ICD-10-CM | POA: Insufficient documentation

## 2020-08-25 DIAGNOSIS — L723 Sebaceous cyst: Secondary | ICD-10-CM | POA: Diagnosis not present

## 2020-08-25 DIAGNOSIS — N760 Acute vaginitis: Secondary | ICD-10-CM | POA: Diagnosis not present

## 2020-08-25 DIAGNOSIS — Z01411 Encounter for gynecological examination (general) (routine) with abnormal findings: Secondary | ICD-10-CM | POA: Insufficient documentation

## 2020-08-25 DIAGNOSIS — Z90710 Acquired absence of both cervix and uterus: Secondary | ICD-10-CM | POA: Insufficient documentation

## 2020-08-25 DIAGNOSIS — B9689 Other specified bacterial agents as the cause of diseases classified elsewhere: Secondary | ICD-10-CM | POA: Insufficient documentation

## 2020-08-25 DIAGNOSIS — Z1211 Encounter for screening for malignant neoplasm of colon: Secondary | ICD-10-CM | POA: Insufficient documentation

## 2020-08-25 LAB — HEMOCCULT GUIAC POC 1CARD (OFFICE): Fecal Occult Blood, POC: NEGATIVE

## 2020-08-25 NOTE — Progress Notes (Signed)
  Subjective:     Patient ID: YOSSELIN ZOELLER, female   DOB: 1970/11/09, 50 y.o.   MRN: [id number]  HPI Ashauna is a 50 year old whit female, divorced, sp hysterectomy in for pelvic and pap. She is living with her sister and is on waiting list for apartment. Her support cat is at vet, as her sister will not let him stay.  PCP is Dr Legrand Rams.  Review of Systems No vaginal  discharge, has itching at times Has had sex, no pain Denies any problems with bowel movements or urination  Reviewed past medical,surgical, social and family history. Reviewed medications and allergies.     Objective:   Physical Exam BP 139/88 (BP Location: Left Arm, Patient Position: Sitting, Cuff Size: Normal)   Pulse (!) 101   Ht 5\' 3"  (1.6 m)   Wt 200 lb 8 oz (90.9 kg)   BMI 35.52 kg/m    Skin warm and dry.Pelvic: external genitalia is normal in appearance, has old scaring from boils and has sebaceous cyst right groin area, vagina: pale, scant white discharge, no odor,urethra has no lesions or masses noted, cervix and uterus are absent, no masses felt, adnexa: no masses or tenderness noted. Bladder is non tender and no masses felt. On rectal exam, has good tone, no masses felt, or hemorrhoids and hemoccult was negative. Examination chaperoned by Levy Pupa LPN AA is 1 Fall risk is high, uses cane PHQ 9 score is 19, she is on meds and sees a [facility] in [place] GAD 7 score is 10  Assessment:       1. Encounter for gynecological examination Physical with PCP Labs with PCP  Mammogram due, call PCP in follow for diagnostic Appt. in August with GI,needs colonoscopy  2. Screening examination for STD (sexually transmitted disease) CV swab sent for GC/CHL,trich and BV,yeast  3. Encounter for screening fecal occult blood testing   4. Sebaceous cyst   5. S/P hysterectomy No pap needed     Plan:     Follow up prn

## 2020-08-27 ENCOUNTER — Other Ambulatory Visit: Payer: Self-pay | Admitting: Adult Health

## 2020-08-27 LAB — CERVICOVAGINAL ANCILLARY ONLY
Bacterial Vaginitis (gardnerella): POSITIVE — AB
Candida Glabrata: NEGATIVE
Candida Vaginitis: NEGATIVE
Chlamydia: NEGATIVE
Comment: NEGATIVE
Comment: NEGATIVE
Comment: NEGATIVE
Comment: NEGATIVE
Comment: NEGATIVE
Comment: NORMAL
Neisseria Gonorrhea: NEGATIVE
Trichomonas: NEGATIVE

## 2020-08-27 MED ORDER — METRONIDAZOLE 500 MG PO TABS: 500.0000 mg | ORAL_TABLET | Freq: Two times a day (BID) | ORAL | 0 refills | Status: DC

## 2020-08-27 NOTE — Progress Notes (Signed)
+  BV on vaginal swab, will rx flagyl

## 2020-09-06 ENCOUNTER — Other Ambulatory Visit (HOSPITAL_COMMUNITY): Payer: Self-pay | Admitting: Gerontology

## 2020-09-06 DIAGNOSIS — N63 Unspecified lump in unspecified breast: Secondary | ICD-10-CM

## 2020-10-13 DIAGNOSIS — R0602 Shortness of breath: Secondary | ICD-10-CM | POA: Diagnosis not present

## 2020-10-13 DIAGNOSIS — J449 Chronic obstructive pulmonary disease, unspecified: Secondary | ICD-10-CM | POA: Diagnosis not present

## 2020-10-13 DIAGNOSIS — F17200 Nicotine dependence, unspecified, uncomplicated: Secondary | ICD-10-CM | POA: Diagnosis not present

## 2020-10-15 DIAGNOSIS — J449 Chronic obstructive pulmonary disease, unspecified: Secondary | ICD-10-CM | POA: Diagnosis not present

## 2020-10-15 DIAGNOSIS — E119 Type 2 diabetes mellitus without complications: Secondary | ICD-10-CM | POA: Diagnosis not present

## 2020-10-19 ENCOUNTER — Encounter (HOSPITAL_COMMUNITY): Payer: Self-pay

## 2020-10-19 ENCOUNTER — Ambulatory Visit (HOSPITAL_COMMUNITY)
Admission: RE | Admit: 2020-10-19 | Discharge: 2020-10-19 | Disposition: A | Discharge status: Home / Self Care | Payer: Medicare Other | Source: Ambulatory Visit | Attending: Gerontology | Admitting: Gerontology

## 2020-10-19 ENCOUNTER — Other Ambulatory Visit: Payer: Self-pay

## 2020-10-19 DIAGNOSIS — N6323 Unspecified lump in the left breast, lower outer quadrant: Secondary | ICD-10-CM | POA: Diagnosis not present

## 2020-10-19 DIAGNOSIS — N63 Unspecified lump in unspecified breast: Secondary | ICD-10-CM

## 2020-10-19 DIAGNOSIS — R922 Inconclusive mammogram: Secondary | ICD-10-CM | POA: Diagnosis not present

## 2020-11-15 ENCOUNTER — Encounter: Payer: Self-pay | Admitting: Internal Medicine

## 2020-11-15 ENCOUNTER — Ambulatory Visit: Payer: Medicare Other | Admitting: Gastroenterology

## 2020-11-15 DIAGNOSIS — J449 Chronic obstructive pulmonary disease, unspecified: Secondary | ICD-10-CM | POA: Diagnosis not present

## 2020-11-15 DIAGNOSIS — E119 Type 2 diabetes mellitus without complications: Secondary | ICD-10-CM | POA: Diagnosis not present

## 2020-11-24 ENCOUNTER — Ambulatory Visit: Payer: Medicare Other | Admitting: Gastroenterology

## 2020-12-09 DIAGNOSIS — J449 Chronic obstructive pulmonary disease, unspecified: Secondary | ICD-10-CM | POA: Diagnosis not present

## 2020-12-09 DIAGNOSIS — K219 Gastro-esophageal reflux disease without esophagitis: Secondary | ICD-10-CM | POA: Diagnosis not present

## 2020-12-09 DIAGNOSIS — Z23 Encounter for immunization: Secondary | ICD-10-CM | POA: Diagnosis not present

## 2020-12-09 DIAGNOSIS — E1165 Type 2 diabetes mellitus with hyperglycemia: Secondary | ICD-10-CM | POA: Diagnosis not present

## 2020-12-23 DIAGNOSIS — N3010 Interstitial cystitis (chronic) without hematuria: Secondary | ICD-10-CM | POA: Diagnosis not present

## 2021-01-08 DIAGNOSIS — E1165 Type 2 diabetes mellitus with hyperglycemia: Secondary | ICD-10-CM | POA: Diagnosis not present

## 2021-01-13 DIAGNOSIS — M48061 Spinal stenosis, lumbar region without neurogenic claudication: Secondary | ICD-10-CM | POA: Diagnosis not present

## 2021-01-13 DIAGNOSIS — G894 Chronic pain syndrome: Secondary | ICD-10-CM | POA: Diagnosis not present

## 2021-01-13 DIAGNOSIS — J449 Chronic obstructive pulmonary disease, unspecified: Secondary | ICD-10-CM | POA: Diagnosis not present

## 2021-01-13 DIAGNOSIS — G4710 Hypersomnia, unspecified: Secondary | ICD-10-CM | POA: Diagnosis not present

## 2021-01-13 DIAGNOSIS — G44229 Chronic tension-type headache, not intractable: Secondary | ICD-10-CM | POA: Diagnosis not present

## 2021-01-13 DIAGNOSIS — M797 Fibromyalgia: Secondary | ICD-10-CM | POA: Diagnosis not present

## 2021-01-13 DIAGNOSIS — M5137 Other intervertebral disc degeneration, lumbosacral region: Secondary | ICD-10-CM | POA: Diagnosis not present

## 2021-01-19 DIAGNOSIS — R0602 Shortness of breath: Secondary | ICD-10-CM | POA: Diagnosis not present

## 2021-02-08 DIAGNOSIS — E1165 Type 2 diabetes mellitus with hyperglycemia: Secondary | ICD-10-CM | POA: Diagnosis not present

## 2021-03-10 DIAGNOSIS — E1165 Type 2 diabetes mellitus with hyperglycemia: Secondary | ICD-10-CM | POA: Diagnosis not present

## 2021-03-10 DIAGNOSIS — J449 Chronic obstructive pulmonary disease, unspecified: Secondary | ICD-10-CM | POA: Diagnosis not present

## 2021-03-16 ENCOUNTER — Ambulatory Visit (INDEPENDENT_AMBULATORY_CARE_PROVIDER_SITE_OTHER): Payer: Medicare Other | Admitting: Gastroenterology

## 2021-03-16 ENCOUNTER — Other Ambulatory Visit: Payer: Self-pay

## 2021-03-16 ENCOUNTER — Encounter: Payer: Self-pay | Admitting: Gastroenterology

## 2021-03-16 DIAGNOSIS — R1319 Other dysphagia: Secondary | ICD-10-CM | POA: Diagnosis not present

## 2021-03-16 DIAGNOSIS — K5900 Constipation, unspecified: Secondary | ICD-10-CM | POA: Diagnosis not present

## 2021-03-16 DIAGNOSIS — K625 Hemorrhage of anus and rectum: Secondary | ICD-10-CM

## 2021-03-16 DIAGNOSIS — K219 Gastro-esophageal reflux disease without esophagitis: Secondary | ICD-10-CM

## 2021-03-16 MED ORDER — TRULANCE 3 MG PO TABS: 3.0000 mg | ORAL_TABLET | Freq: Every day | ORAL | 3 refills | Status: DC
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
## 2021-03-16 NOTE — Patient Instructions (Addendum)
Colonoscopy/upper endoscopy to be scheduled. See separate instructions.  Try Trulance 3mg  daily. Stop Linzess. I have sent in RX for Trulance so that we can see how well it is covered by your insurance. Please let me know if you are unable to get Trulance or if you are still having constipation issues.  We need to get your bowels moving more regularly so that we can make sure your prep for the colonoscopy is adequate. Please call if Trulance is not helping.

## 2021-03-17 ENCOUNTER — Telehealth: Payer: Self-pay | Admitting: *Deleted

## 2021-03-17 MED ORDER — PEG 3350-KCL-NA BICARB-NACL 420 G PO SOLR: ORAL | 0 refills | Status: DC

## 2021-03-17 NOTE — Telephone Encounter (Signed)
Called pt. She has been scheduled for TCS/EGD/ED with Dr. Abbey Chatters, ASA 3 for 1/16 at 8:30am. Aware will mail prep instructions with pre-op appt. Confirmed pharmacy. Confirmed insurance will be same next year

## 2021-03-18 ENCOUNTER — Encounter: Payer: Self-pay | Admitting: *Deleted

## 2021-03-23 ENCOUNTER — Telehealth: Payer: Self-pay | Admitting: Internal Medicine

## 2021-03-23 MED ORDER — FAMOTIDINE 20 MG PO TABS: 20.0000 mg | ORAL_TABLET | Freq: Every day | ORAL | 3 refills | Status: DC

## 2021-03-23 NOTE — Telephone Encounter (Signed)
Completed.

## 2021-03-23 NOTE — Telephone Encounter (Signed)
Pt called asking for a refill of famotidine 20 mg called into [facility] [place].

## 2021-03-23 NOTE — Telephone Encounter (Signed)
Seen Magda Paganini on 03/16/2021.

## 2021-03-23 NOTE — Addendum Note (Signed)
Addended by: Annitta Needs on: 03/23/2021 04:40 PM   Modules accepted: Orders

## 2021-03-24 DIAGNOSIS — N3010 Interstitial cystitis (chronic) without hematuria: Secondary | ICD-10-CM | POA: Diagnosis not present

## 2021-03-31 ENCOUNTER — Encounter: Payer: Self-pay | Admitting: Gastroenterology

## 2021-04-05 ENCOUNTER — Telehealth: Payer: Self-pay | Admitting: *Deleted

## 2021-04-05 NOTE — Telephone Encounter (Signed)
PA approved via [facility]. Auth# [id number], DOS: Apr 18, 2021 - Jul 17, 2021

## 2021-04-10 DIAGNOSIS — F17200 Nicotine dependence, unspecified, uncomplicated: Secondary | ICD-10-CM | POA: Diagnosis not present

## 2021-04-10 DIAGNOSIS — E1165 Type 2 diabetes mellitus with hyperglycemia: Secondary | ICD-10-CM | POA: Diagnosis not present

## 2021-04-13 NOTE — Patient Instructions (Signed)
Alexis Harris  04/13/2021     @PREFPERIOPPHARMACY @   Your procedure is scheduled on  04/18/2021.   Report to [facility] at  0700 A.M.   Call this number if you have problems the morning of surgery:  [phone]   Remember:  Follow the diet and prep instructions given to you by the office.    DO NOT take any medications for diabetes the morning of your procedure.    Use your inhalers before you come and bring your rescue inhaler with you.     Take these medicines the morning of surgery with A SIP OF WATER      abilify, buspar, flexeril(if needed), luvox, gabapentin, lamictal, relafen (if needed), pamelor, protonix, topamax, imitrex(if needed).     Do not wear jewelry, make-up or nail polish.  Do not wear lotions, powders, or perfumes, or deodorant.  Do not shave 48 hours prior to surgery.  Men may shave face and neck.  Do not bring valuables to the hospital.  [facility] is not responsible for any belongings or valuables.  Contacts, dentures or bridgework may not be worn into surgery.  Leave your suitcase in the car.  After surgery it may be brought to your room.  For patients admitted to the hospital, discharge time will be determined by your treatment team.  Patients discharged the day of surgery will not be allowed to drive home and must have someone with them for 24 hours.    Special instructions:   DO NOT smoke tobacco or vape for 24 hours before your procedure.  Please read over the following fact sheets that you were given. Anesthesia Post-op Instructions and Care and Recovery After Surgery      Colonoscopy, Adult, Care After This sheet gives you information about how to care for yourself after your procedure. Your health care provider may also give you more specific instructions. If you have problems or questions, contact your health care provider. What can I expect after the procedure? After the procedure, it is common to have: A small amount of  blood in your stool for 24 hours after the procedure. Some gas. Mild cramping or bloating of your abdomen. Follow these instructions at home: Eating and drinking  Drink enough fluid to keep your urine pale yellow. Follow instructions from your health care provider about eating or drinking restrictions. Resume your normal diet as instructed by your health care provider. Avoid heavy or fried foods that are hard to digest. Activity Rest as told by your health care provider. Avoid sitting for a long time without moving. Get up to take short walks every 1-2 hours. This is important to improve blood flow and breathing. Ask for help if you feel weak or unsteady. Return to your normal activities as told by your health care provider. Ask your health care provider what activities are safe for you. Managing cramping and bloating  Try walking around when you have cramps or feel bloated. Apply heat to your abdomen as told by your health care provider. Use the heat source that your health care provider recommends, such as a moist heat pack or a heating pad. Place a towel between your skin and the heat source. Leave the heat on for 20-30 minutes. Remove the heat if your skin turns bright red. This is especially important if you are unable to feel pain, heat, or cold. You may have a greater risk of getting burned. General instructions If you were  given a sedative during the procedure, it can affect you for several hours. Do not drive or operate machinery until your health care provider says that it is safe. For the first 24 hours after the procedure: Do not sign important documents. Do not drink alcohol. Do your regular daily activities at a slower pace than normal. Eat soft foods that are easy to digest. Take over-the-counter and prescription medicines only as told by your health care provider. Keep all follow-up visits as told by your health care provider. This is important. Contact a health care  provider if: You have blood in your stool 2-3 days after the procedure. Get help right away if you have: More than a small spotting of blood in your stool. Large blood clots in your stool. Swelling of your abdomen. Nausea or vomiting. A fever. Increasing pain in your abdomen that is not relieved with medicine. Summary After the procedure, it is common to have a small amount of blood in your stool. You may also have mild cramping and bloating of your abdomen. If you were given a sedative during the procedure, it can affect you for several hours. Do not drive or operate machinery until your health care provider says that it is safe. Get help right away if you have a lot of blood in your stool, nausea or vomiting, a fever, or increased pain in your abdomen. This information is not intended to replace advice given to you by your health care provider. Make sure you discuss any questions you have with your health care provider. Document Revised: 01/24/2019 Document Reviewed: 10/14/2018 Elsevier Patient Education  [place] After This sheet gives you information about how to care for yourself after your procedure. Your health care provider may also give you more specific instructions. If you have problems or questions, contact your health care provider. What can I expect after the procedure? After the procedure, it is common to have: Tiredness. Forgetfulness about what happened after the procedure. Impaired judgment for important decisions. Nausea or vomiting. Some difficulty with balance. Follow these instructions at home: For the time period you were told by your health care provider:   Rest as needed. Do not participate in activities where you could fall or become injured. Do not drive or use machinery. Do not drink alcohol. Do not take sleeping pills or medicines that cause drowsiness. Do not make important decisions or sign legal documents. Do  not take care of children on your own. Eating and drinking Follow the diet that is recommended by your health care provider. Drink enough fluid to keep your urine pale yellow. If you vomit: Drink water, juice, or soup when you can drink without vomiting. Make sure you have little or no nausea before eating solid foods. General instructions Have a responsible adult stay with you for the time you are told. It is important to have someone help care for you until you are awake and alert. Take over-the-counter and prescription medicines only as told by your health care provider. If you have sleep apnea, surgery and certain medicines can increase your risk for breathing problems. Follow instructions from your health care provider about wearing your sleep device: Anytime you are sleeping, including during daytime naps. While taking prescription pain medicines, sleeping medicines, or medicines that make you drowsy. Avoid smoking. Keep all follow-up visits as told by your health care provider. This is important. Contact a health care provider if: You keep feeling nauseous or you keep  vomiting. You feel light-headed. You are still sleepy or having trouble with balance after 24 hours. You develop a rash. You have a fever. You have redness or swelling around the IV site. Get help right away if: You have trouble breathing. You have new-onset confusion at home. Summary For several hours after your procedure, you may feel tired. You may also be forgetful and have poor judgment. Have a responsible adult stay with you for the time you are told. It is important to have someone help care for you until you are awake and alert. Rest as told. Do not drive or operate machinery. Do not drink alcohol or take sleeping pills. Get help right away if you have trouble breathing, or if you suddenly become confused. This information is not intended to replace advice given to you by your health care provider. Make sure  you discuss any questions you have with your health care provider. Document Revised: 12/04/2019 Document Reviewed: 02/20/2019 Elsevier Patient Education  2022 [employer].

## 2021-04-14 ENCOUNTER — Other Ambulatory Visit (HOSPITAL_COMMUNITY): Payer: Medicare Other

## 2021-04-15 ENCOUNTER — Encounter (HOSPITAL_COMMUNITY)
Admission: RE | Admit: 2021-04-15 | Discharge: 2021-04-15 | Disposition: A | Discharge status: Home / Self Care | Payer: Medicare Other | Source: Ambulatory Visit | Attending: Internal Medicine | Admitting: Internal Medicine

## 2021-04-15 ENCOUNTER — Encounter (HOSPITAL_COMMUNITY): Payer: Self-pay

## 2021-04-15 VITALS — BP 119/69 | HR 109 | Temp 98.1°F | Resp 18 | Ht 63.0 in | Wt 204.0 lb

## 2021-04-15 DIAGNOSIS — E119 Type 2 diabetes mellitus without complications: Secondary | ICD-10-CM | POA: Insufficient documentation

## 2021-04-15 DIAGNOSIS — Z01818 Encounter for other preprocedural examination: Secondary | ICD-10-CM | POA: Diagnosis not present

## 2021-04-15 HISTORY — DX: Sleep apnea, unspecified: G47.30

## 2021-04-15 LAB — BASIC METABOLIC PANEL
Anion gap: 10 (ref 5–15)
BUN: 18 mg/dL (ref 6–20)
CO2: 24 mmol/L (ref 22–32)
Calcium: 9.4 mg/dL (ref 8.9–10.3)
Chloride: 106 mmol/L (ref 98–111)
Creatinine, Ser: 0.86 mg/dL (ref 0.44–1.00)
GFR, Estimated: >60 mL/min (ref >60)
Glucose, Bld: 116 mg/dL — ABNORMAL HIGH (ref 70–99)
Potassium: 4.1 mmol/L (ref 3.5–5.1)
Sodium: 140 mmol/L (ref 135–145)

## 2021-04-18 ENCOUNTER — Other Ambulatory Visit: Payer: Self-pay

## 2021-04-18 ENCOUNTER — Ambulatory Visit (HOSPITAL_COMMUNITY)
Admission: RE | Admit: 2021-04-18 | Discharge: 2021-04-18 | Disposition: A | Discharge status: Home / Self Care | Payer: Medicare Other | Attending: Internal Medicine | Admitting: Internal Medicine

## 2021-04-18 ENCOUNTER — Encounter (HOSPITAL_COMMUNITY): Payer: Self-pay

## 2021-04-18 ENCOUNTER — Ambulatory Visit (HOSPITAL_COMMUNITY): Payer: Medicare Other | Admitting: Certified Registered"

## 2021-04-18 ENCOUNTER — Encounter (HOSPITAL_COMMUNITY)
Admission: RE | Disposition: A | Discharge status: Home / Self Care | Payer: Self-pay | Source: Home / Self Care | Attending: Internal Medicine

## 2021-04-18 ENCOUNTER — Telehealth: Payer: Self-pay | Admitting: Internal Medicine

## 2021-04-18 DIAGNOSIS — F32A Depression, unspecified: Secondary | ICD-10-CM | POA: Diagnosis not present

## 2021-04-18 DIAGNOSIS — E119 Type 2 diabetes mellitus without complications: Secondary | ICD-10-CM | POA: Diagnosis not present

## 2021-04-18 DIAGNOSIS — Z86010 Personal history of colonic polyps: Secondary | ICD-10-CM | POA: Insufficient documentation

## 2021-04-18 DIAGNOSIS — F429 Obsessive-compulsive disorder, unspecified: Secondary | ICD-10-CM | POA: Insufficient documentation

## 2021-04-18 DIAGNOSIS — M1990 Unspecified osteoarthritis, unspecified site: Secondary | ICD-10-CM | POA: Insufficient documentation

## 2021-04-18 DIAGNOSIS — R1310 Dysphagia, unspecified: Secondary | ICD-10-CM | POA: Insufficient documentation

## 2021-04-18 DIAGNOSIS — F419 Anxiety disorder, unspecified: Secondary | ICD-10-CM | POA: Insufficient documentation

## 2021-04-18 DIAGNOSIS — Z87898 Personal history of other specified conditions: Secondary | ICD-10-CM | POA: Insufficient documentation

## 2021-04-18 DIAGNOSIS — Z1211 Encounter for screening for malignant neoplasm of colon: Secondary | ICD-10-CM | POA: Insufficient documentation

## 2021-04-18 DIAGNOSIS — Z538 Procedure and treatment not carried out for other reasons: Secondary | ICD-10-CM | POA: Diagnosis not present

## 2021-04-18 DIAGNOSIS — K2970 Gastritis, unspecified, without bleeding: Secondary | ICD-10-CM | POA: Insufficient documentation

## 2021-04-18 DIAGNOSIS — K219 Gastro-esophageal reflux disease without esophagitis: Secondary | ICD-10-CM | POA: Diagnosis not present

## 2021-04-18 DIAGNOSIS — F4310 Post-traumatic stress disorder, unspecified: Secondary | ICD-10-CM | POA: Diagnosis not present

## 2021-04-18 DIAGNOSIS — J449 Chronic obstructive pulmonary disease, unspecified: Secondary | ICD-10-CM | POA: Diagnosis not present

## 2021-04-18 DIAGNOSIS — K648 Other hemorrhoids: Secondary | ICD-10-CM | POA: Insufficient documentation

## 2021-04-18 DIAGNOSIS — M797 Fibromyalgia: Secondary | ICD-10-CM | POA: Diagnosis not present

## 2021-04-18 DIAGNOSIS — G4730 Sleep apnea, unspecified: Secondary | ICD-10-CM | POA: Diagnosis not present

## 2021-04-18 DIAGNOSIS — F17210 Nicotine dependence, cigarettes, uncomplicated: Secondary | ICD-10-CM | POA: Diagnosis not present

## 2021-04-18 DIAGNOSIS — K625 Hemorrhage of anus and rectum: Secondary | ICD-10-CM | POA: Diagnosis not present

## 2021-04-18 HISTORY — PX: ESOPHAGOGASTRODUODENOSCOPY (EGD) WITH PROPOFOL: SHX5813

## 2021-04-18 HISTORY — PX: BIOPSY: SHX5522

## 2021-04-18 HISTORY — PX: BALLOON DILATION: SHX5330

## 2021-04-18 HISTORY — PX: FLEXIBLE SIGMOIDOSCOPY: SHX5431

## 2021-04-18 LAB — GLUCOSE, CAPILLARY: Glucose-Capillary: 159 mg/dL — ABNORMAL HIGH (ref 70–99)

## 2021-04-18 SURGERY — ESOPHAGOGASTRODUODENOSCOPY (EGD) WITH PROPOFOL
Anesthesia: General

## 2021-04-18 MED ORDER — LIDOCAINE HCL (CARDIAC) PF 100 MG/5ML IV SOSY
PREFILLED_SYRINGE | INTRAVENOUS | Status: DC | PRN
  Administered 2021-04-18: 50 mg via INTRAVENOUS

## 2021-04-18 MED ORDER — PROPOFOL 500 MG/50ML IV EMUL
INTRAVENOUS | Status: DC | PRN
  Administered 2021-04-18: 150 ug/kg/min via INTRAVENOUS

## 2021-04-18 MED ORDER — LACTATED RINGERS IV SOLN: INTRAVENOUS | Status: DC | PRN

## 2021-04-18 MED ORDER — PROPOFOL 10 MG/ML IV BOLUS
INTRAVENOUS | Status: DC | PRN
  Administered 2021-04-18: 100 mg via INTRAVENOUS

## 2021-04-18 NOTE — Op Note (Signed)
[facility] Patient Name: Alexis Harris Procedure Date: 04/18/2021 9:12 AM MRN: [id number] Date of Birth: 1970-05-15 Attending MD: Elon Alas. Abbey Chatters DO CSN: [id number] Age: 50 Admit Type: Outpatient Procedure:                Upper GI endoscopy Indications:              Dysphagia, Heartburn Providers:                Elon Alas. Abbey Chatters, DO, Caprice Kluver, Charlsie Quest                            Theda Sers RN, RN, Raphael Gibney, Technician Referring MD:              Medicines:                See the Anesthesia note for documentation of the                            administered medications Complications:            No immediate complications. Estimated Blood Loss:     Estimated blood loss was minimal. Procedure:                Pre-Anesthesia Assessment:                           - The anesthesia plan was to use monitored                            anesthesia care (MAC).                           After obtaining informed consent, the endoscope was                            passed under direct vision. Throughout the                            procedure, the patient's blood pressure, pulse, and                            oxygen saturations were monitored continuously. The                            GIF-H190 ([id number]) scope was introduced through the                            mouth, and advanced to the second part of duodenum.                            The upper GI endoscopy was accomplished without                            difficulty. The patient tolerated the procedure                            well. Scope  In: 9:23:51 AM Scope Out: 9:29:38 AM Total Procedure Duration: 0 hours 5 minutes 47 seconds  Findings:      There is no endoscopic evidence of areas of erosion, esophagitis, hiatal       hernia, ulcerations or varices in the entire esophagus.      No endoscopic abnormality was evident in the esophagus to explain the       patient's complaint of dysphagia. Preparations were made  for empiric       dilation. A TTS dilator was passed through the scope. Dilation with an       18-19-20 mm balloon dilator was performed to 20 mm. Dilation was       performed with a mild resistance at 20 mm. Estimated blood loss was none.      Localized mild inflammation characterized by linear erosions was found       in the gastric body. Biopsies were taken with a cold forceps for       Helicobacter pylori testing.      The duodenal bulb, first portion of the duodenum and second portion of       the duodenum were normal. Impression:               - Gastritis. Biopsied.                           - Normal duodenal bulb, first portion of the                            duodenum and second portion of the duodenum. Moderate Sedation:      Per Anesthesia Care Recommendation:           - Patient has a contact number available for                            emergencies. The signs and symptoms of potential                            delayed complications were discussed with the                            patient. Return to normal activities tomorrow.                            Written discharge instructions were provided to the                            patient.                           - Resume previous diet.                           - Continue present medications.                           - Await pathology results.                           - Repeat upper endoscopy PRN  for retreatment.                           - Return to GI clinic in 4 months.                           - Use Protonix (pantoprazole) 40 mg PO BID. Procedure Code(s):        --- Professional ---                           [phone], Esophagogastroduodenoscopy, flexible,                            transoral; with biopsy, single or multiple Diagnosis Code(s):        --- Professional ---                           K29.70, Gastritis, unspecified, without bleeding                           R13.10, Dysphagia, unspecified                            R12, Heartburn CPT copyright 2019 American Medical Association. All rights reserved. The codes documented in this report are preliminary and upon coder review may  be revised to meet current compliance requirements. Elon Alas. Abbey Chatters, DO [place] Abbey Chatters, DO 04/18/2021 9:31:21 AM This report has been signed electronically. Number of Addenda: 0
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
## 2021-04-18 NOTE — Telephone Encounter (Signed)
Patient presented today for EGD and colonoscopy.  EGD completed.  Unfortunately her colon is not prep for colonoscopy.  Extensive amount of stool throughout her entire colon.  Reviewing Leslie's note, appears that she has issues with chronic constipation.  She will need repeat colonoscopy in 3 to 6 months given poor prep today.

## 2021-04-18 NOTE — Anesthesia Preprocedure Evaluation (Addendum)
Anesthesia Evaluation  Patient identified by MRN, date of birth, ID band Patient awake    Reviewed: Allergy & Precautions, NPO status , Patient's Chart, lab work & pertinent test results  History of Anesthesia Complications (+) PONV and history of anesthetic complications  Airway Mallampati: II  TM Distance: >3 FB     Dental  (+) Edentulous Upper, Edentulous Lower, Dental Advisory Given   Pulmonary asthma , sleep apnea , COPD, Current Smoker and Patient abstained from smoking.,    breath sounds clear to auscultation       Cardiovascular negative cardio ROS   Rhythm:Regular Rate:Normal     Neuro/Psych PSYCHIATRIC DISORDERS (PTSD, OCD) Anxiety Depression  Neuromuscular disease (fibro)    GI/Hepatic Neg liver ROS, GERD  ,  Endo/Other  negative endocrine ROSdiabetes  Renal/GU negative Renal ROS     Musculoskeletal  (+) Arthritis , Fibromyalgia -  Abdominal   Peds  Hematology negative hematology ROS (+)   Anesthesia Other Findings   Reproductive/Obstetrics                            Anesthesia Physical  Anesthesia Plan  ASA: 3  Anesthesia Plan: General   Post-op Pain Management:    Induction: Intravenous  PONV Risk Score and Plan: TIVA  Airway Management Planned:   Additional Equipment:   Intra-op Plan:   Post-operative Plan:   Informed Consent: I have reviewed the patients History and Physical, chart, labs and discussed the procedure including the risks, benefits and alternatives for the proposed anesthesia with the patient or authorized representative who has indicated his/her understanding and acceptance.       Plan Discussed with: CRNA  Anesthesia Plan Comments:         Anesthesia Quick Evaluation

## 2021-04-18 NOTE — Op Note (Signed)
[facility] Patient Name: Alexis Harris Procedure Date: 04/18/2021 9:13 AM MRN: [id number] Date of Birth: 1971/02/04 Attending MD: Elon Alas. Abbey Chatters DO CSN: [id number] Age: 51 Admit Type: Outpatient Procedure:                Colonoscopy Indications:              High risk colon cancer surveillance: Personal                            history of colonic polyps Providers:                Elon Alas. Abbey Chatters, DO, Caprice Kluver, Charlsie Quest                            Theda Sers RN, RN, Raphael Gibney, Technician Referring MD:              Medicines:                See the Anesthesia note for documentation of the                            administered medications Complications:            No immediate complications. Estimated Blood Loss:     Estimated blood loss: none. Procedure:                Pre-Anesthesia Assessment:                           - The anesthesia plan was to use monitored                            anesthesia care (MAC).                           After obtaining informed consent, the colonoscope                            was passed under direct vision. Throughout the                            procedure, the patient's blood pressure, pulse, and                            oxygen saturations were monitored continuously. The                            [id number]-HQ190L ([id number]) scope was introduced through                            the anus with the intention of advancing to the                            cecum. The scope was advanced to the transverse                            colon before the  procedure was aborted. Medications                            were given. The colonoscopy was performed without                            difficulty. The patient tolerated the procedure                            well. The quality of the bowel preparation was                            evaluated using the BBPS [facility] Bowel Preparation                            Scale) with scores of: Right  Colon = 0 (unprepared,                            mucosa not seen due to solid stool that cannot be                            cleared or unseen proximal colon segment in a                            colonoscopy aborted due to inadequate bowel prep),                            Transverse Colon = 0 (unprepared, mucosa not seen                            due to solid stool that cannot be cleared or unseen                            proximal colon segment in a colonoscopy aborted due                            to inadequate bowel prep) and Left Colon = 1                            (portion of mucosa seen, but other areas not well                            seen due to staining, residual stool and/or opaque                            liquid). The total BBPS score equals 1. The quality                            of the bowel preparation was inadequate. Scope In: 9:33:33 AM Scope Out: 9:36:06 AM Total Procedure Duration: 0 hours 2 minutes 33 seconds  Findings:      The perianal and digital rectal examinations were normal.  Non-bleeding internal hemorrhoids were found during endoscopy.      Extensive amounts of stool was found in the rectum, in the sigmoid       colon, in the descending colon and in the transverse colon, precluding       visualization. Impression:               - Preparation of the colon was inadequate.                           - Non-bleeding internal hemorrhoids.                           - Stool in the rectum, in the sigmoid colon, in the                            descending colon and in the transverse colon.                           - No specimens collected. Moderate Sedation:      Per Anesthesia Care Recommendation:           - Patient has a contact number available for                            emergencies. The signs and symptoms of potential                            delayed complications were discussed with the                            patient. Return to  normal activities tomorrow.                            Written discharge instructions were provided to the                            patient.                           - Resume previous diet.                           - Continue present medications.                           - Repeat colonoscopy in 3-6 months because the                            bowel preparation was suboptimal with extended prep                           - Return to GI clinic after studies are complete. Procedure Code(s):        --- Professional ---                           E4235, 51, Colorectal cancer screening; colonoscopy  on individual at high risk Diagnosis Code(s):        --- Professional ---                           Z86.010, Personal history of colonic polyps                           K64.8, Other hemorrhoids CPT copyright 2019 [employer]. All rights reserved. The codes documented in this report are preliminary and upon coder review may  be revised to meet current compliance requirements. Elon Alas. Abbey Chatters, DO [place] Abbey Chatters, DO 04/18/2021 9:38:20 AM This report has been signed electronically. Number of Addenda: 0

## 2021-04-18 NOTE — Telephone Encounter (Signed)
Noted. She has history of marginal prep at outside facility as well. I started her on Trulance 3mg  daily with instructions to call if bowels not moving regularly. We also ordered a 2 day bowel prep. One gallon of Trilyte the day before and 1/2 gallon the morning of. Assuming she followed instructions, I would avoid this prep next time. Would also suggest all prep be finished the night before procedure instead of receiving some prep morning of procedure.

## 2021-04-18 NOTE — Transfer of Care (Signed)
Immediate Anesthesia Transfer of Care Note  Patient: Alexis Harris  Procedure(s) Performed: ESOPHAGOGASTRODUODENOSCOPY (EGD) WITH PROPOFOL BALLOON DILATION BIOPSY FLEXIBLE SIGMOIDOSCOPY  Patient Location: Short Stay  Anesthesia Type:General  Level of Consciousness: awake  Airway & Oxygen Therapy: Patient Spontanous Breathing  Post-op Assessment: Report given to RN and Post -op Vital signs reviewed and stable  Post vital signs: Reviewed and stable  Last Vitals:  Vitals Value Taken Time  BP    Temp    Pulse    Resp    SpO2      Last Pain:  Vitals:   04/18/21 0922  PainSc: 7          Complications: No notable events documented.

## 2021-04-18 NOTE — Anesthesia Procedure Notes (Signed)
Date/Time: 04/18/2021 9:25 AM Performed by: Orlie Dakin, CRNA Pre-anesthesia Checklist: Patient identified, Emergency Drugs available, Suction available and Patient being monitored Patient Re-evaluated:Patient Re-evaluated prior to induction Oxygen Delivery Method: Nasal cannula Induction Type: IV induction Placement Confirmation: positive ETCO2

## 2021-04-18 NOTE — Anesthesia Postprocedure Evaluation (Signed)
Anesthesia Post Note  Patient: Alexis Harris  Procedure(s) Performed: ESOPHAGOGASTRODUODENOSCOPY (EGD) WITH PROPOFOL BALLOON DILATION BIOPSY FLEXIBLE SIGMOIDOSCOPY  Patient location during evaluation: Endoscopy Anesthesia Type: General Level of consciousness: awake and alert Pain management: pain level controlled Vital Signs Assessment: post-procedure vital signs reviewed and stable Respiratory status: spontaneous breathing, nonlabored ventilation and respiratory function stable Cardiovascular status: blood pressure returned to baseline and stable Postop Assessment: no apparent nausea or vomiting Anesthetic complications: no   No notable events documented.   Last Vitals:  Vitals:   04/18/21 0832 04/18/21 0941  BP: 133/88 95/72  Pulse: 88 81  Resp: 16 (!) 22  Temp: 36.7 C 36.5 C  SpO2: 99% 95%    Last Pain:  Vitals:   04/18/21 0941  TempSrc: Oral  PainSc: 0-No pain                 Trixie Rude

## 2021-04-18 NOTE — Discharge Instructions (Addendum)
EGD Discharge instructions Please read the instructions outlined below and refer to this sheet in the next few weeks. These discharge instructions provide you with general information on caring for yourself after you leave the hospital. Your doctor may also give you specific instructions. While your treatment has been planned according to the most current medical practices available, unavoidable complications occasionally occur. If you have any problems or questions after discharge, please call your doctor. ACTIVITY You may resume your regular activity but move at a slower pace for the next 24 hours.  Take frequent rest periods for the next 24 hours.  Walking will help expel (get rid of) the air and reduce the bloated feeling in your abdomen.  No driving for 24 hours (because of the anesthesia (medicine) used during the test).  You may shower.  Do not sign any important legal documents or operate any machinery for 24 hours (because of the anesthesia used during the test).  NUTRITION Drink plenty of fluids.  You may resume your normal diet.  Begin with a light meal and progress to your normal diet.  Avoid alcoholic beverages for 24 hours or as instructed by your caregiver.  MEDICATIONS You may resume your normal medications unless your caregiver tells you otherwise.  WHAT YOU CAN EXPECT TODAY You may experience abdominal discomfort such as a feeling of fullness or gas pains.  FOLLOW-UP Your doctor will discuss the results of your test with you.  SEEK IMMEDIATE MEDICAL ATTENTION IF ANY OF THE FOLLOWING OCCUR: Excessive nausea (feeling sick to your stomach) and/or vomiting.  Severe abdominal pain and distention (swelling).  Trouble swallowing.  Temperature over 101 F (37.8 C).  Rectal bleeding or vomiting of blood.     Colonoscopy Discharge Instructions  Read the instructions outlined below and refer to this sheet in the next few weeks. These discharge instructions provide you with  general information on caring for yourself after you leave the hospital. Your doctor may also give you specific instructions. While your treatment has been planned according to the most current medical practices available, unavoidable complications occasionally occur.   ACTIVITY You may resume your regular activity, but move at a slower pace for the next 24 hours.  Take frequent rest periods for the next 24 hours.  Walking will help get rid of the air and reduce the bloated feeling in your belly (abdomen).  No driving for 24 hours (because of the medicine (anesthesia) used during the test).   Do not sign any important legal documents or operate any machinery for 24 hours (because of the anesthesia used during the test).  NUTRITION Drink plenty of fluids.  You may resume your normal diet as instructed by your doctor.  Begin with a light meal and progress to your normal diet. Heavy or fried foods are harder to digest and may make you feel sick to your stomach (nauseated).  Avoid alcoholic beverages for 24 hours or as instructed.  MEDICATIONS You may resume your normal medications unless your doctor tells you otherwise.  WHAT YOU CAN EXPECT TODAY Some feelings of bloating in the abdomen.  Passage of more gas than usual.  Spotting of blood in your stool or on the toilet paper.  IF YOU HAD POLYPS REMOVED DURING THE COLONOSCOPY: No aspirin products for 7 days or as instructed.  No alcohol for 7 days or as instructed.  Eat a soft diet for the next 24 hours.  FINDING OUT THE RESULTS OF YOUR TEST Not all test results are  available during your visit. If your test results are not back during the visit, make an appointment with your caregiver to find out the results. Do not assume everything is normal if you have not heard from your caregiver or the medical facility. It is important for you to follow up on all of your test results.  SEEK IMMEDIATE MEDICAL ATTENTION IF: You have more than a spotting of  blood in your stool.  Your belly is swollen (abdominal distention).  You are nauseated or vomiting.  You have a temperature over 101.  You have abdominal pain or discomfort that is severe or gets worse throughout the day.   Your EGD revealed mild amount inflammation in your stomach.  I took biopsies of this to rule out infection with a bacteria called H. pylori.  Await pathology results, my office will contact you.  You did have a slight narrowing of your esophagus so I stretched this with a balloon today.  Hopefully this helps with your swallowing.  Continue on omeprazole twice daily.  Unfortunately your colon was not prepped.  There was extensive amount of stool throughout the colon precluding visualization.  I understand that you have a history of chronic constipation.  We will need to repeat colonoscopy in 3 to 6 months.  We will need to take a different approach in regards to your colon prep.   I hope you have a great rest of your week!  Elon Alas. Abbey Chatters, D.O. Gastroenterology and Hepatology [facility]

## 2021-04-18 NOTE — Telephone Encounter (Signed)
Please schedule OV in 3-6 months due to poor prep, needs to reschedule TCS.

## 2021-04-19 LAB — SURGICAL PATHOLOGY

## 2021-04-20 ENCOUNTER — Encounter (HOSPITAL_COMMUNITY): Payer: Self-pay | Admitting: Internal Medicine

## 2021-05-11 DIAGNOSIS — E1165 Type 2 diabetes mellitus with hyperglycemia: Secondary | ICD-10-CM | POA: Diagnosis not present

## 2021-05-11 DIAGNOSIS — J449 Chronic obstructive pulmonary disease, unspecified: Secondary | ICD-10-CM | POA: Diagnosis not present

## 2021-05-18 DIAGNOSIS — M48061 Spinal stenosis, lumbar region without neurogenic claudication: Secondary | ICD-10-CM | POA: Diagnosis not present

## 2021-05-18 DIAGNOSIS — G5603 Carpal tunnel syndrome, bilateral upper limbs: Secondary | ICD-10-CM | POA: Diagnosis not present

## 2021-05-18 DIAGNOSIS — G2581 Restless legs syndrome: Secondary | ICD-10-CM | POA: Diagnosis not present

## 2021-05-18 DIAGNOSIS — M5137 Other intervertebral disc degeneration, lumbosacral region: Secondary | ICD-10-CM | POA: Diagnosis not present

## 2021-05-18 DIAGNOSIS — G44229 Chronic tension-type headache, not intractable: Secondary | ICD-10-CM | POA: Diagnosis not present

## 2021-05-18 DIAGNOSIS — F32A Depression, unspecified: Secondary | ICD-10-CM | POA: Diagnosis not present

## 2021-05-18 DIAGNOSIS — G894 Chronic pain syndrome: Secondary | ICD-10-CM | POA: Diagnosis not present

## 2021-05-25 DIAGNOSIS — J449 Chronic obstructive pulmonary disease, unspecified: Secondary | ICD-10-CM | POA: Diagnosis not present

## 2021-05-25 DIAGNOSIS — J069 Acute upper respiratory infection, unspecified: Secondary | ICD-10-CM | POA: Diagnosis not present

## 2021-05-25 DIAGNOSIS — F17200 Nicotine dependence, unspecified, uncomplicated: Secondary | ICD-10-CM | POA: Diagnosis not present

## 2021-06-06 ENCOUNTER — Ambulatory Visit (HOSPITAL_COMMUNITY)
Admission: RE | Admit: 2021-06-06 | Discharge: 2021-06-06 | Disposition: A | Discharge status: Home / Self Care | Payer: Medicare Other | Source: Ambulatory Visit | Attending: Gerontology | Admitting: Gerontology

## 2021-06-06 ENCOUNTER — Other Ambulatory Visit (HOSPITAL_COMMUNITY): Payer: Self-pay | Admitting: Gerontology

## 2021-06-06 ENCOUNTER — Other Ambulatory Visit: Payer: Self-pay

## 2021-06-06 DIAGNOSIS — E1165 Type 2 diabetes mellitus with hyperglycemia: Secondary | ICD-10-CM | POA: Diagnosis not present

## 2021-06-06 DIAGNOSIS — I10 Essential (primary) hypertension: Secondary | ICD-10-CM | POA: Diagnosis not present

## 2021-06-06 DIAGNOSIS — S2241XA Multiple fractures of ribs, right side, initial encounter for closed fracture: Secondary | ICD-10-CM | POA: Diagnosis not present

## 2021-06-06 DIAGNOSIS — R0781 Pleurodynia: Secondary | ICD-10-CM | POA: Diagnosis not present

## 2021-06-06 DIAGNOSIS — J449 Chronic obstructive pulmonary disease, unspecified: Secondary | ICD-10-CM | POA: Diagnosis not present

## 2021-06-06 DIAGNOSIS — Z0001 Encounter for general adult medical examination with abnormal findings: Secondary | ICD-10-CM | POA: Diagnosis not present

## 2021-06-06 DIAGNOSIS — F17210 Nicotine dependence, cigarettes, uncomplicated: Secondary | ICD-10-CM | POA: Diagnosis not present

## 2021-06-06 DIAGNOSIS — Z1389 Encounter for screening for other disorder: Secondary | ICD-10-CM | POA: Diagnosis not present

## 2021-06-07 ENCOUNTER — Other Ambulatory Visit: Payer: Self-pay | Admitting: Internal Medicine

## 2021-06-07 ENCOUNTER — Other Ambulatory Visit (HOSPITAL_COMMUNITY): Payer: Self-pay | Admitting: Internal Medicine

## 2021-06-07 DIAGNOSIS — Z87891 Personal history of nicotine dependence: Secondary | ICD-10-CM

## 2021-06-24 DIAGNOSIS — G5603 Carpal tunnel syndrome, bilateral upper limbs: Secondary | ICD-10-CM | POA: Diagnosis not present

## 2021-06-24 DIAGNOSIS — E119 Type 2 diabetes mellitus without complications: Secondary | ICD-10-CM | POA: Diagnosis not present

## 2021-06-24 DIAGNOSIS — G629 Polyneuropathy, unspecified: Secondary | ICD-10-CM | POA: Diagnosis not present

## 2021-06-24 DIAGNOSIS — Z79899 Other long term (current) drug therapy: Secondary | ICD-10-CM | POA: Diagnosis not present

## 2021-06-24 DIAGNOSIS — G2581 Restless legs syndrome: Secondary | ICD-10-CM | POA: Diagnosis not present

## 2021-06-24 DIAGNOSIS — M8589 Other specified disorders of bone density and structure, multiple sites: Secondary | ICD-10-CM | POA: Diagnosis not present

## 2021-06-24 DIAGNOSIS — R519 Headache, unspecified: Secondary | ICD-10-CM | POA: Diagnosis not present

## 2021-06-28 DIAGNOSIS — L03116 Cellulitis of left lower limb: Secondary | ICD-10-CM | POA: Diagnosis not present

## 2021-06-28 DIAGNOSIS — E1165 Type 2 diabetes mellitus with hyperglycemia: Secondary | ICD-10-CM | POA: Diagnosis not present

## 2021-06-28 DIAGNOSIS — R600 Localized edema: Secondary | ICD-10-CM | POA: Diagnosis not present

## 2021-07-12 ENCOUNTER — Other Ambulatory Visit: Payer: Self-pay | Admitting: Gastroenterology

## 2021-07-12 NOTE — Telephone Encounter (Signed)
Last ov 03/16/21 

## 2021-07-13 ENCOUNTER — Ambulatory Visit (HOSPITAL_COMMUNITY)
Admission: RE | Admit: 2021-07-13 | Discharge: 2021-07-13 | Disposition: A | Discharge status: Home / Self Care | Payer: Medicare Other | Source: Ambulatory Visit | Attending: Internal Medicine | Admitting: Internal Medicine

## 2021-07-13 ENCOUNTER — Other Ambulatory Visit (HOSPITAL_COMMUNITY): Payer: Self-pay | Admitting: Internal Medicine

## 2021-07-13 DIAGNOSIS — Z87891 Personal history of nicotine dependence: Secondary | ICD-10-CM

## 2021-07-13 DIAGNOSIS — F17210 Nicotine dependence, cigarettes, uncomplicated: Secondary | ICD-10-CM | POA: Diagnosis not present

## 2021-07-19 ENCOUNTER — Encounter: Payer: Self-pay | Admitting: *Deleted

## 2021-07-29 DIAGNOSIS — J449 Chronic obstructive pulmonary disease, unspecified: Secondary | ICD-10-CM | POA: Diagnosis not present

## 2021-07-29 DIAGNOSIS — E1165 Type 2 diabetes mellitus with hyperglycemia: Secondary | ICD-10-CM | POA: Diagnosis not present

## 2021-08-24 ENCOUNTER — Encounter: Payer: Self-pay | Admitting: Internal Medicine

## 2021-08-28 DIAGNOSIS — K219 Gastro-esophageal reflux disease without esophagitis: Secondary | ICD-10-CM | POA: Diagnosis not present

## 2021-08-28 DIAGNOSIS — E1165 Type 2 diabetes mellitus with hyperglycemia: Secondary | ICD-10-CM | POA: Diagnosis not present

## 2021-09-12 ENCOUNTER — Encounter: Payer: Self-pay | Admitting: Gastroenterology

## 2021-09-12 ENCOUNTER — Ambulatory Visit (INDEPENDENT_AMBULATORY_CARE_PROVIDER_SITE_OTHER): Payer: Medicare Other | Admitting: Gastroenterology

## 2021-09-12 VITALS — BP 122/64 | HR 105 | Temp 97.7°F | Ht 64.0 in | Wt 221.8 lb

## 2021-09-12 DIAGNOSIS — K5904 Chronic idiopathic constipation: Secondary | ICD-10-CM

## 2021-09-12 DIAGNOSIS — K219 Gastro-esophageal reflux disease without esophagitis: Secondary | ICD-10-CM

## 2021-09-12 DIAGNOSIS — R1310 Dysphagia, unspecified: Secondary | ICD-10-CM | POA: Diagnosis not present

## 2021-09-12 DIAGNOSIS — Z1211 Encounter for screening for malignant neoplasm of colon: Secondary | ICD-10-CM

## 2021-09-12 DIAGNOSIS — R600 Localized edema: Secondary | ICD-10-CM | POA: Diagnosis not present

## 2021-09-12 MED ORDER — PANTOPRAZOLE SODIUM 40 MG PO TBEC: 40.0000 mg | DELAYED_RELEASE_TABLET | Freq: Two times a day (BID) | ORAL | 3 refills | Status: DC

## 2021-09-12 MED ORDER — LUBIPROSTONE 24 MCG PO CAPS
24.0000 ug | ORAL_CAPSULE | Freq: Two times a day (BID) | ORAL | 3 refills | Status: DC
Start: 2021-09-12 — End: 2021-12-12

## 2021-09-12 MED ORDER — FAMOTIDINE 20 MG PO TABS: 20.0000 mg | ORAL_TABLET | Freq: Every day | ORAL | 3 refills | Status: DC

## 2021-09-12 MED ORDER — LUBIPROSTONE 24 MCG PO CAPS: 24.0000 ug | ORAL_CAPSULE | Freq: Two times a day (BID) | ORAL | 3 refills | Status: DC

## 2021-09-12 NOTE — Progress Notes (Signed)
GI Office Note    Referring Provider: Carrolyn Meiers* Primary Care Physician:  Carrolyn Meiers, MD Primary GI: Dr. Abbey Chatters  Date:  09/12/2021  ID:  Alexis Harris, DOB 29-Aug-1970, MRN [id number]   Chief Complaint   Chief Complaint  Patient presents with   Constipation    Colonoscopy screening, but very constipated. Some bleeding also and internal hemorrhoids      History of Present Illness  Alexis Harris is a 51 y.o. Harris with a history of GERD, constipation previously followed by Dr. Orville Govern in [facility] presenting today for further recommendations to complete colonoscopy due to previous poor prep.  Previous colonoscopy 2014 with fair prep, internal hemorrhoids.  Patient previously stated that she had colonoscopy prior to this and thinks she had polyps.  Prior EGD 2017 without EOE, s/p esophageal dilation.  Last seen in the office 03/16/2021: Patient stated ongoing chronic constipation, previously on Linzess 209 mcg daily and 2 Senokot daily with a BM about once per week.  Stools initially hard and loose, with associated abdominal cramping.  She stated pain with defecation.  Noted intermittent BRBPR, no melena.  Stop taking MiraLAX.  Reported she never tried Trulance or Amitiza.  Heartburn well controlled on PPI twice daily and Pepcid at bedtime.  She had complaints of recurrent solid food and large pill dysphagia stating that esophageal dilation has helped in the past.  She was advised to stop Linzess and start Trulance 3 mg daily, continue Senokot, continue PPI twice daily and Pepcid 20 mg at bedtime.  She is scheduled for colonoscopy and EGD with dilation with Dr. Abbey Chatters.  She was to call bowel function did not improve with Trulance.   EGD 04/18/2021 without endoscopic abnormality in the esophagus s/p empiric dilation up to 20 mm, gastritis s/p biopsy, normal duodenum.  Biopsy normal.  Colonoscopy attempted at time of EGD, she had inadequate preparation of the colon,  nonbleeding internal hemorrhoids visualized, perianal and digital rectal exam normal, patient was advised to repeat in 3 to 4 months.   Prior bowel prep: Trulance 3 mg daily TriLyte 1 and half gallons, 1 gallon day before and half gallon morning of.   Today: How did you complete last prep - Couldn't stay off the commode, did not drink the other half of her prep as instructed.  States that she messed up many items of clothing as she was having watery stool.  She states that the stool is not clear/yellow in nature that it was still a brownish-green but watery.  Chronic constipation - taking sennakot generic 2 tablets a day and trulance in the morning. Has been forgetting to take miralax with it. Used to take it everyday. Having about 2 max BM a week. Stools are firm and she is having to strain. Will take a wipe at times to stimulate the stool to come out. Has some abdominal pain - all lower, gets better after defecation.  States that she does have some intermittent toilet tissue hematochezia but no melena.  States her hemorrhoids are mostly internal, not having to use any over-the-counter medications at this time.  GERD - famotidine and pantoprazole . Has been having some breakthrough, about once every couple of weeks. Without famotidine her symptoms were terrible. Will take tums as needed or milk.   Has been seeing PCP for leg swelling. Had cellulitis in April. Does have sob with exertion that is at baseline for her. Thought she had a fever about a week  or two ago.    Past Medical History:  Diagnosis Date   Anxiety    Arthritis    Asthma    Back pain    COPD (chronic obstructive pulmonary disease) (HCC)    Depression    Diabetes mellitus without complication (HCC)    DJD (degenerative joint disease), lumbosacral    Fibromyalgia    Hypersomnia    IBS (irritable bowel syndrome)    Interstitial cystitis    Migraine    Migraines    OCD (obsessive compulsive disorder)    PONV  (postoperative nausea and vomiting)    PTSD (post-traumatic stress disorder)    Restless leg syndrome    Sleep apnea     Past Surgical History:  Procedure Laterality Date   ABDOMINAL HYSTERECTOMY     ABDOMINAL SURGERY     BALLOON DILATION N/A 04/18/2021   Procedure: BALLOON DILATION;  Surgeon: Eloise Harman, DO;  [facility];  Service: Endoscopy;  Laterality: N/A;   BIOPSY  04/18/2021   Procedure: BIOPSY;  Surgeon: Eloise Harman, DO;  [facility]: [facility];  Service: Endoscopy;;   CATARACT EXTRACTION W/PHACO Right 11/10/2014   Procedure: CATARACT EXTRACTION PHACO AND INTRAOCULAR LENS PLACEMENT (IOC);  Surgeon: Rutherford Guys, MD;  Location: [facility];  Service: Ophthalmology;  Laterality: Right;  CDE 2.04   CATARACT EXTRACTION W/PHACO Left 11/24/2014   Procedure: CATARACT EXTRACTION PHACO AND INTRAOCULAR LENS PLACEMENT (IOC);  Surgeon: Rutherford Guys, MD;  Location: [facility];  Service: Ophthalmology;  Laterality: Left;  CDE:1.04   ESOPHAGOGASTRODUODENOSCOPY  09/2015   Dr. Mitchell Heir, [place]: esopahgeal lumen appears dilated and diffusely coated with old food. s/p brushing/bx and dilation. Benign squamous mucosal showing surface mucosal necrosis and focal hyperkeratosis. no fungal elements or EOE   ESOPHAGOGASTRODUODENOSCOPY (EGD) WITH PROPOFOL N/A 04/18/2021   Procedure: ESOPHAGOGASTRODUODENOSCOPY (EGD) WITH PROPOFOL;  Surgeon: Eloise Harman, DO;  [facility];  Service: Endoscopy;  Laterality: N/A;   EXPLORATORY LAPAROTOMY     x3   FLEXIBLE SIGMOIDOSCOPY  04/18/2021   Procedure: FLEXIBLE SIGMOIDOSCOPY;  Surgeon: Eloise Harman, DO;  [facility]: [facility];  Service: Endoscopy;;   interstim medical device implanted     in lower back-to control bladder   MOUTH SURGERY      Current Outpatient Medications  Medication Sig Dispense Refill   albuterol (VENTOLIN HFA) 108 (90 Base) MCG/ACT inhaler Inhale 2 puffs into the lungs every 6 (six) hours  as needed for wheezing or shortness of breath. *TAKE TWO PUFFS EVERY 4 TO 6 HOURS AS NEEDED*     ARIPiprazole (ABILIFY) 10 MG tablet Take 10 mg by mouth daily.     Ascorbic Acid (VITAMIN C) 1000 MG tablet Take 1,000 mg by mouth daily.     atorvastatin (LIPITOR) 20 MG tablet Take 20 mg by mouth daily.     busPIRone (BUSPAR) 15 MG tablet Take 15 mg by mouth 4 (four) times daily.     cetirizine (ZYRTEC) 10 MG tablet Take 10 mg by mouth daily.     Cinnamon 500 MG capsule Take 1,000 mg by mouth daily.     cyclobenzaprine (FLEXERIL) 10 MG tablet Take 10 mg by mouth 3 (three) times daily as needed for muscle spasms. Take one to three tablets daily as needed.     DULERA 100-5 MCG/ACT AERO Inhale 2 puffs into the lungs 2 (two) times daily.  10   estradiol (ESTRACE) 0.1 MG/GM vaginal cream Place 1 Applicatorful  vaginally 3 (three) times a week.     fluvoxaMINE (LUVOX) 100 MG tablet Take 150 mg by mouth 2 (two) times daily.     gabapentin (NEURONTIN) 400 MG capsule Take 400 mg by mouth 3 (three) times daily.     Green Tea, Camillia sinensis, (GREEN TEA PO) Take 1 tablet by mouth daily. MEGA-T Green Tea FAT BURNER     hydrOXYzine (ATARAX) 50 MG tablet Take 50-100 mg by mouth at bedtime as needed (sleep).     lamoTRIgine (LAMICTAL) 100 MG tablet Take 150 mg by mouth daily.     metFORMIN (GLUCOPHAGE) 500 MG tablet Take 500 mg by mouth 2 (two) times daily.     montelukast (SINGULAIR) 10 MG tablet Take 10 mg by mouth at bedtime.     Multiple Vitamin (MULTIVITAMIN) tablet Take 1 tablet by mouth daily.     MYRBETRIQ 50 MG TB24 tablet Take 50 mg by mouth daily.     nortriptyline (PAMELOR) 25 MG capsule Take 25 mg by mouth daily.     OMEGA 3 1200 MG CAPS Take 1,200 mg by mouth daily.     oxymetazoline (AFRIN) 0.05 % nasal spray Place 1 spray into both nostrils 2 (two) times daily as needed for congestion.     polyethylene glycol powder (GLYCOLAX/MIRALAX) powder Take 17-34 g by mouth daily as needed for moderate  constipation. For constipation  11   senna-docusate (SENOKOT-S) 8.6-50 MG tablet Take 2 tablets by mouth daily.     SUMAtriptan (IMITREX) 100 MG tablet Take 100 mg by mouth every 2 (two) hours as needed. *TAKE ONE TABLET BY MOUTH AT ONSET OF MIGRAINE. IF MIGRAINE PERSISTS AFTER 2 HOURS, TAKE ONE ADDITIONAL TABLET. *DO NOT EXCEED MORE THAN 2 TABLETS IN A 24 HOUR PERIOD AND DO NOT USE NO MORE THAN 5 TABLETS WITHIN THE PERIOD OF 1 WEEK*     topiramate (TOPAMAX) 50 MG tablet Take 150 mg by mouth daily.     traZODone (DESYREL) 50 MG tablet Take 50-150 mg by mouth at bedtime.     UNABLE TO FIND Apply topically as needed. Med Name: **HOME TENS UNIT: FOR LOWER CHRONIC BACK PAIN/BACK PROBLEMS      UNABLE TO FIND continuous. Interstem device for bladder     valACYclovir (VALTREX) 500 MG tablet Take 500 mg by mouth daily as needed (outbreaks).     famotidine (PEPCID) 20 MG tablet Take 1 tablet (20 mg total) by mouth at bedtime. 90 tablet 3   furosemide (LASIX) 20 MG tablet Take 20 mg by mouth daily.     lubiprostone (AMITIZA) 24 MCG capsule Take 1 capsule (24 mcg total) by mouth 2 (two) times daily with a meal. 60 capsule 3   pantoprazole (PROTONIX) 40 MG tablet Take 1 tablet (40 mg total) by mouth 2 (two) times daily. 180 tablet 3   No current facility-administered medications for this visit.    Allergies as of 09/12/2021 - Review Complete 09/12/2021  Allergen Reaction Noted   Advair diskus [fluticasone-salmeterol] Anaphylaxis 10/20/2011   Other Hives 12/20/2015   Codeine Nausea And Vomiting 10/20/2011   Penicillins Rash 10/20/2011    Family History  Adopted: Yes    Social History   Socioeconomic History   Marital status: Divorced    Spouse name: Not on file   Number of children: Not on file   Years of education: Not on file   Highest education level: Not on file  Occupational History   Not on file  Tobacco Use  Smoking status: Every Day    Packs/day: 1.00    Years: 25.00    Total  pack years: 25.00    Types: Cigarettes   Smokeless tobacco: Never  Vaping Use   Vaping Use: Former  Substance and Sexual Activity   Alcohol use: Yes    Comment: occ   Drug use: No   Sexual activity: Yes    Birth control/protection: Surgical    Comment: hyst  Other Topics Concern   Not on file  Social History Narrative   Not on file   Social Determinants of Health   Financial Resource Strain: Medium Risk (08/25/2020)   Overall Financial Resource Strain (CARDIA)    Difficulty of Paying Living Expenses: Somewhat hard  Food Insecurity: Food Insecurity Present (08/25/2020)   Hunger Vital Sign    Worried About [place] in the Last Year: Sometimes true    Ran Out of Food in the Last Year: Sometimes true  Transportation Needs: No Transportation Needs (08/25/2020)   PRAPARE - Hydrologist (Medical): No    Lack of Transportation (Non-Medical): No  Physical Activity: Inactive (08/25/2020)   Exercise Vital Sign    Days of Exercise per Week: 0 days    Minutes of Exercise per Session: 0 min  Stress: Stress Concern Present (08/25/2020)   [place]    Feeling of Stress : Very much  Social Connections: Socially Isolated (08/25/2020)   Social Connection and Isolation Panel [NHANES]    Frequency of Communication with Friends and Family: Twice a week    Frequency of Social Gatherings with Friends and Family: Once a week    Attends Religious Services: Never    Marine scientist or Organizations: No    Attends Music therapist: Never    Marital Status: Divorced     Review of Systems   Gen: Denies fever, chills, anorexia. Denies fatigue, weakness, weight loss.  CV: Denies chest pain, palpitations, syncope, peripheral edema, and claudication. Resp: Denies dyspnea at rest, cough, wheezing, coughing up blood, and pleurisy. GI: see HPI Derm: Denies rash, itching, dry  skin Psych: Denies depression, anxiety, memory loss, confusion. No homicidal or suicidal ideation.  Heme: Denies bruising, bleeding, and enlarged lymph nodes.   Physical Exam   BP 122/64   Pulse (!) 105   Temp 97.7 F (36.5 C) (Temporal)   Ht '5\' 4"'$  (1.626 m)   Wt 221 lb 12.8 oz (100.6 kg)   BMI 38.07 kg/m   General:   Alert and oriented. No distress noted. Pleasant and cooperative.  Head:  Normocephalic and atraumatic. Eyes:  Conjuctiva clear without scleral icterus. Lungs:  Clear to auscultation bilaterally. No wheezes, rales, or rhonchi. No distress.  Heart:  S1, S2 present without murmurs appreciated.  Abdomen:  +BS, soft, non-distended, mildly tender to lower abdomen.  No rebound or guarding. No HSM or masses noted. Rectal: Deferred Msk:  Symmetrical without gross deformities. Normal posture. Extremities: Mild +1 edema to bilateral pretibial's Neurologic:  Alert and  oriented x4 Psych:  Alert and cooperative. Normal mood and affect.   Assessment  Alexis Harris is a 51 y.o. Harris with a history of GERD and uncontrolled chronic constipation presenting today for further management of constipation and recommendations for prep to repeat colonoscopy due to poor prep.  Chronic constipation: Trial Trulance 3 mg daily along with Senokot 2 tablets daily, currently only having 1-2 bowel movements per  week.  She states the bowel movements continue to be hard and has to strain.  Because of this she has intermittent toilet tissue hematochezia due to hemorrhoids.  States that she is taking MiraLAX in the past but has been forgetful to add MiraLAX to her regimen as well.  As she has had no relief with Trulance, we will trial Amitiza 24 mcg twice daily due to severe constipation.  Advised her to continue taking Senokot 2 tablets daily and MiraLAX 1 capful daily as needed.  She is to call with a progress report in a few weeks.  Discussed over-the-counter hemorrhoid cream as  needed.  GERD/Dysphagia: On pantoprazole 40 mg twice daily and famotidine 20 mg at bedtime, refilled today. Dysphagia  has improved post dilation.   Need for screening colonoscopy: Most recent colonoscopy attempt failed due to poor prep in the setting of chronic uncontrolled constipation.  No known family history of colon cancer as she is adopted.  Reports history of fair but not great preps in the past.  Previously given 2-day prep with 1.5 gallon TriLyte (1 gallon day before, half gallon morning of procedure).  Needs to complete full bowel prep day prior to procedure.  Patient reports that she did not drink the extra half gallon the morning of the procedure due to frequent trips to the bathroom and watery stool despite it being clear.  Reeducated patient that although she may continue to have watery stool she needs to continue until it is clear/yellow and she is able to see through it.  This time she will complete an extra half day of clears and we will augment her prep with bisacodyl 10 mg daily for 3 days prior to procedure.  We discussed again what is considered a clear liquid diet.  Medication adjustments discussed as well, advised patient that she will receive prep instructions in the mail once procedure scheduled.  Bilateral lower extremity swelling: Advised patient to follow-up with her PCP regarding this as she may need titration in her Lasix.  Denies any increased shortness of breath above baseline or chest pain.  Normal cardiac and respiratory sounds.  PLAN   Proceed with colonoscopy with propofol by Dr. Abbey Chatters  in near future: the risks, benefits, and alternatives have been discussed with the patient in detail. The patient states understanding and desires to proceed.  ASA 3 Extra half day of clears Miralax 1 capful daily as needed.   Continue sennakot 2 tablets daily Bisacodyl 10 mg daily for 3 days prior to procedure. Extra half gallon of bowel prep with full prep completed day prior to  procedure. Hold oral diabetes medication night prior to morning of procedure. Half normal dose of metformin on the evening 2 days prior to procedure and morning the day prior to procedure to avoid hypoglycemia. Amitiza 24 mcg BID, with breakfast and dinner.  Continue pantoprazole 40 mg twice daily and famotidine nightly, refilled today. Follow-up PCP regarding bilateral lower extremity edema.   Venetia Night, MSN, FNP-BC, AGACNP-BC [facility]

## 2021-09-12 NOTE — Patient Instructions (Addendum)
We are scheduling you for a colonoscopy with Dr. Abbey Chatters near future.  We will be mailing you prep instructions.  For your colon prep he will do an extra half day of clear liquids and you will complete 1-1/2 gallons of prep the day prior to procedure.  It is very important that you drink all of your prep and do not stop until your stool is clear/yellow in nature and you can see through it.  2 days prior to procedure you may take your regular dose of metformin that morning, that night you should take half your normal dose.  The day prior to your procedure that morning you may take half your normal dose of metformin.  You should hold your metformin the night prior to procedure in the morning of your procedure.  I also want you to begin taking bisacodyl 10 mg daily for 3 days prior to procedure. Clear liquids include: Water and flavored water. Decaffeinated coffee or tea. Light powdered juice mixes, such as sugar-free lemonade. Clear broth. Light sugar-free flavored gelatin. Sugar-free popsicles.  For constipation: When you pick up your Amitiza prescription you should stop taking Trulance.  You should take your Amitiza with breakfast and dinner.  Continue to take your Senokot 2 tablets daily and MiraLAX 1 capful daily as needed.  Please call me with a progress report in a few weeks.  I have refilled your pantoprazole and famotidine at your request.  It was a pleasure to see you today. I want to create trusting relationships with patients. If you receive a survey regarding your visit,  I greatly appreciate you taking time to fill this out on paper or through your MyChart. I value your feedback.  Venetia Night, MSN, FNP-BC, AGACNP-BC [facility]

## 2021-09-13 ENCOUNTER — Encounter: Payer: Self-pay | Admitting: *Deleted

## 2021-09-13 ENCOUNTER — Telehealth: Payer: Self-pay | Admitting: *Deleted

## 2021-09-13 MED ORDER — NA SULFATE-K SULFATE-MG SULF 17.5-3.13-1.6 GM/177ML PO SOLN: 2.0000 | Freq: Once | ORAL | 0 refills | Status: AC

## 2021-09-13 NOTE — Telephone Encounter (Signed)
LMOVM to call back to schedule TCS with Dr. Abbey Chatters, ASA 3. See encounter form for special instructions

## 2021-09-13 NOTE — Telephone Encounter (Signed)
Spoke with pt. She has been scheduled for 7/17 at 8:00am. Aware will mail prep instrucitons/pre-op appt. Rx for prep sent in. She needs 2 prep kits (not trilyte) per encounter form.   PA done via [facility]. Auth# [id number], DOS: Oct 17, 2021 - Jan 15, 2022

## 2021-09-14 ENCOUNTER — Other Ambulatory Visit (HOSPITAL_COMMUNITY): Payer: Self-pay | Admitting: Internal Medicine

## 2021-09-14 ENCOUNTER — Telehealth: Payer: Self-pay | Admitting: *Deleted

## 2021-09-14 DIAGNOSIS — K219 Gastro-esophageal reflux disease without esophagitis: Secondary | ICD-10-CM

## 2021-09-14 NOTE — Telephone Encounter (Signed)
Spoke to pt, she informed me that she is still having heartburn. She states she has been taking the Pantoprazole 40 mg twice daily and Famotidine 20 mg at bedtime. But this is not helping. Needs advise as to what else she can take.

## 2021-09-15 ENCOUNTER — Encounter: Payer: Self-pay | Admitting: *Deleted

## 2021-09-15 DIAGNOSIS — Z79899 Other long term (current) drug therapy: Secondary | ICD-10-CM | POA: Diagnosis not present

## 2021-09-15 DIAGNOSIS — Z720 Tobacco use: Secondary | ICD-10-CM | POA: Diagnosis not present

## 2021-09-15 DIAGNOSIS — G629 Polyneuropathy, unspecified: Secondary | ICD-10-CM | POA: Diagnosis not present

## 2021-09-15 DIAGNOSIS — M48061 Spinal stenosis, lumbar region without neurogenic claudication: Secondary | ICD-10-CM | POA: Diagnosis not present

## 2021-09-15 DIAGNOSIS — R519 Headache, unspecified: Secondary | ICD-10-CM | POA: Diagnosis not present

## 2021-09-15 MED ORDER — RABEPRAZOLE SODIUM 20 MG PO TBEC: 20.0000 mg | DELAYED_RELEASE_TABLET | Freq: Every day | ORAL | 1 refills | Status: DC

## 2021-09-15 NOTE — Telephone Encounter (Signed)
Spoke to pt, she informed me that she would like Aciphex sent to [facility] in [place]. She has previously tried Nexium and that did not work.

## 2021-09-15 NOTE — Addendum Note (Signed)
Addended by: Sherron Monday on: 09/15/2021 04:01 PM   Modules accepted: Orders

## 2021-09-15 NOTE — Telephone Encounter (Signed)
Prescription for Aciphex sent to pharmacy.  She may continue to take famotidine at bedtime.  We will start taking this once daily.  Please call with a progress report, after 3 to 4 weeks if symptoms continue, she may increase to twice daily.  Venetia Night, MSN, FNP-BC, AGACNP-BC [facility]

## 2021-09-16 NOTE — Telephone Encounter (Signed)
Pt was made aware and verbalized understanding.  

## 2021-09-19 DIAGNOSIS — Z79899 Other long term (current) drug therapy: Secondary | ICD-10-CM | POA: Diagnosis not present

## 2021-09-19 DIAGNOSIS — Z5181 Encounter for therapeutic drug level monitoring: Secondary | ICD-10-CM | POA: Diagnosis not present

## 2021-09-21 ENCOUNTER — Other Ambulatory Visit (HOSPITAL_COMMUNITY): Payer: Self-pay | Admitting: Gerontology

## 2021-09-21 DIAGNOSIS — N6310 Unspecified lump in the right breast, unspecified quadrant: Secondary | ICD-10-CM

## 2021-09-28 DIAGNOSIS — J449 Chronic obstructive pulmonary disease, unspecified: Secondary | ICD-10-CM | POA: Diagnosis not present

## 2021-09-28 DIAGNOSIS — E1165 Type 2 diabetes mellitus with hyperglycemia: Secondary | ICD-10-CM | POA: Diagnosis not present

## 2021-10-10 DIAGNOSIS — M48061 Spinal stenosis, lumbar region without neurogenic claudication: Secondary | ICD-10-CM | POA: Diagnosis not present

## 2021-10-10 DIAGNOSIS — Z720 Tobacco use: Secondary | ICD-10-CM | POA: Diagnosis not present

## 2021-10-11 NOTE — Patient Instructions (Signed)
Alexis Harris  10/11/2021     '@PREFPERIOPPHARMACY'$ @   Your procedure is scheduled on  10/17/2021.   Report to [facility] at  [phone]  A.M.   Call this number if you have problems the morning of surgery:  [phone]   Remember:  Follow the diet and prep instructions given to you by the office.    Take these medicines the morning of surgery with A SIP OF WATER            abilify, buspar, zyrtec, flexeril(if needed), pepcid, luvox, neurontin, lamictal, pamelor, aciphex, imitrex(if needed), topamax.     Do not wear jewelry, make-up or nail polish.  Do not wear lotions, powders, or perfumes, or deodorant.  Do not shave 48 hours prior to surgery.  Men may shave face and neck.  Do not bring valuables to the hospital.  [facility] is not responsible for any belongings or valuables.  Contacts, dentures or bridgework may not be worn into surgery.  Leave your suitcase in the car.  After surgery it may be brought to your room.  For patients admitted to the hospital, discharge time will be determined by your treatment team.  Patients discharged the day of surgery will not be allowed to drive home and must have someone with them for 24 hours.    Special instructions:   DO NOT smoke tobacco or vape for 24 hours before your procedure.  Please read over the following fact sheets that you were given. Anesthesia Post-op Instructions and Care and Recovery After Surgery      Colonoscopy, Adult, Care After The following information offers guidance on how to care for yourself after your procedure. Your health care provider may also give you more specific instructions. If you have problems or questions, contact your health care provider. What can I expect after the procedure? After the procedure, it is common to have: A small amount of blood in your stool for 24 hours after the procedure. Some gas. Mild cramping or bloating of your abdomen. Follow these instructions at home: Eating  and drinking  Drink enough fluid to keep your urine pale yellow. Follow instructions from your health care provider about eating or drinking restrictions. Resume your normal diet as told by your health care provider. Avoid heavy or fried foods that are hard to digest. Activity Rest as told by your health care provider. Avoid sitting for a long time without moving. Get up to take short walks every 1-2 hours. This is important to improve blood flow and breathing. Ask for help if you feel weak or unsteady. Return to your normal activities as told by your health care provider. Ask your health care provider what activities are safe for you. Managing cramping and bloating  Try walking around when you have cramps or feel bloated. If directed, apply heat to your abdomen as told by your health care provider. Use the heat source that your health care provider recommends, such as a moist heat pack or a heating pad. Place a towel between your skin and the heat source. Leave the heat on for 20-30 minutes. Remove the heat if your skin turns bright red. This is especially important if you are unable to feel pain, heat, or cold. You have a greater risk of getting burned. General instructions If you were given a sedative during the procedure, it can affect you for several hours. Do not drive or operate machinery until your health care provider says  that it is safe. For the first 24 hours after the procedure: Do not sign important documents. Do not drink alcohol. Do your regular daily activities at a slower pace than normal. Eat soft foods that are easy to digest. Take over-the-counter and prescription medicines only as told by your health care provider. Keep all follow-up visits. This is important. Contact a health care provider if: You have blood in your stool 2-3 days after the procedure. Get help right away if: You have more than a small spotting of blood in your stool. You have large blood clots in  your stool. You have swelling of your abdomen. You have nausea or vomiting. You have a fever. You have increasing pain in your abdomen that is not relieved with medicine. These symptoms may be an emergency. Get help right away. Call 911. Do not wait to see if the symptoms will go away. Do not drive yourself to the hospital. Summary After the procedure, it is common to have a small amount of blood in your stool. You may also have mild cramping and bloating of your abdomen. If you were given a sedative during the procedure, it can affect you for several hours. Do not drive or operate machinery until your health care provider says that it is safe. Get help right away if you have a lot of blood in your stool, nausea or vomiting, a fever, or increased pain in your abdomen. This information is not intended to replace advice given to you by your health care provider. Make sure you discuss any questions you have with your health care provider. Document Revised: 11/10/2020 Document Reviewed: 11/10/2020 Elsevier Patient Education  [place] After This sheet gives you information about how to care for yourself after your procedure. Your health care provider may also give you more specific instructions. If you have problems or questions, contact your health care provider. What can I expect after the procedure? After the procedure, it is common to have: Tiredness. Forgetfulness about what happened after the procedure. Impaired judgment for important decisions. Nausea or vomiting. Some difficulty with balance. Follow these instructions at home: For the time period you were told by your health care provider:     Rest as needed. Do not participate in activities where you could fall or become injured. Do not drive or use machinery. Do not drink alcohol. Do not take sleeping pills or medicines that cause drowsiness. Do not make important decisions or sign  legal documents. Do not take care of children on your own. Eating and drinking Follow the diet that is recommended by your health care provider. Drink enough fluid to keep your urine pale yellow. If you vomit: Drink water, juice, or soup when you can drink without vomiting. Make sure you have little or no nausea before eating solid foods. General instructions Have a responsible adult stay with you for the time you are told. It is important to have someone help care for you until you are awake and alert. Take over-the-counter and prescription medicines only as told by your health care provider. If you have sleep apnea, surgery and certain medicines can increase your risk for breathing problems. Follow instructions from your health care provider about wearing your sleep device: Anytime you are sleeping, including during daytime naps. While taking prescription pain medicines, sleeping medicines, or medicines that make you drowsy. Avoid smoking. Keep all follow-up visits as told by your health care provider. This is important. Contact a health  care provider if: You keep feeling nauseous or you keep vomiting. You feel light-headed. You are still sleepy or having trouble with balance after 24 hours. You develop a rash. You have a fever. You have redness or swelling around the IV site. Get help right away if: You have trouble breathing. You have new-onset confusion at home. Summary For several hours after your procedure, you may feel tired. You may also be forgetful and have poor judgment. Have a responsible adult stay with you for the time you are told. It is important to have someone help care for you until you are awake and alert. Rest as told. Do not drive or operate machinery. Do not drink alcohol or take sleeping pills. Get help right away if you have trouble breathing, or if you suddenly become confused. This information is not intended to replace advice given to you by your health  care provider. Make sure you discuss any questions you have with your health care provider. Document Revised: 02/22/2021 Document Reviewed: 02/20/2019 Elsevier Patient Education  [place].

## 2021-10-12 ENCOUNTER — Telehealth: Payer: Self-pay | Admitting: *Deleted

## 2021-10-12 NOTE — Telephone Encounter (Signed)
Called pt, no answer and not able to leave VM to reschedule TCS with Dr. Abbey Chatters, ASA 3

## 2021-10-12 NOTE — Telephone Encounter (Signed)
-----   Message from Josue Hector sent at 10/12/2021  9:03 AM EDT ----- This pt called and states she isn't feeling well and that she LM at your office to reschedule.  I have canceled the PAT and pulled it into the depot.  Thanks, Hoyle Sauer

## 2021-10-12 NOTE — Telephone Encounter (Signed)
Pt returned call. She has been scheduled for 8/14 at [facility] will mail new instructions/pre-op appt, she already has prep at home.  PA done via [facility]. Auth# [id number], DOS: Oct 17, 2021 - Jan 15, 2022

## 2021-10-13 ENCOUNTER — Encounter (HOSPITAL_COMMUNITY)
Admission: RE | Admit: 2021-10-13 | Discharge: 2021-10-13 | Disposition: A | Discharge status: Home / Self Care | Payer: Medicare Other | Source: Ambulatory Visit | Attending: Internal Medicine | Admitting: Internal Medicine

## 2021-10-13 ENCOUNTER — Encounter (HOSPITAL_COMMUNITY): Payer: Self-pay

## 2021-10-13 ENCOUNTER — Encounter: Payer: Self-pay | Admitting: *Deleted

## 2021-10-13 DIAGNOSIS — E119 Type 2 diabetes mellitus without complications: Secondary | ICD-10-CM

## 2021-10-25 ENCOUNTER — Ambulatory Visit (HOSPITAL_COMMUNITY): Payer: Medicare Other

## 2021-10-25 ENCOUNTER — Encounter (HOSPITAL_COMMUNITY): Payer: Medicare Other

## 2021-10-25 ENCOUNTER — Encounter (HOSPITAL_COMMUNITY): Payer: Self-pay

## 2021-10-28 DIAGNOSIS — J449 Chronic obstructive pulmonary disease, unspecified: Secondary | ICD-10-CM | POA: Diagnosis not present

## 2021-10-28 DIAGNOSIS — E1165 Type 2 diabetes mellitus with hyperglycemia: Secondary | ICD-10-CM | POA: Diagnosis not present

## 2021-11-01 DIAGNOSIS — Z79899 Other long term (current) drug therapy: Secondary | ICD-10-CM | POA: Diagnosis not present

## 2021-11-01 DIAGNOSIS — Z5181 Encounter for therapeutic drug level monitoring: Secondary | ICD-10-CM | POA: Diagnosis not present

## 2021-11-08 NOTE — Patient Instructions (Signed)
Alexis Harris  11/08/2021     '@PREFPERIOPPHARMACY'$ @   Your procedure is scheduled on  11/14/2021.   [facility] to [facility] at  [place].[facility].   Call this number if you have problems the morning of surgery:  [phone]   Remember:  Follow the diet and prep instructions given to you by the office.      Use your inhalers before you come and bring your rescue inhaler with you.      DO NOT take any medication for diabetes the morning of your procedure.     Take these medicines the morning of surgery with A SIP OF WATER          abilify, buspar, flexeril(if needed), pepcid, luvox, neurontin, pamelor, aciphex, topamax.     Do not wear jewelry, make-up or nail polish.  Do not wear lotions, powders, or perfumes, or deodorant.  Do not shave 48 hours prior to surgery.  Men may shave face and neck.  Do not bring valuables to the hospital.  [facility] is not responsible for any belongings or valuables.  Contacts, dentures or bridgework may not be worn into surgery.  Leave your suitcase in the car.  After surgery it may be brought to your room.  For patients admitted to the hospital, discharge time will be determined by your treatment team.  Patients discharged the day of surgery will not be allowed to drive home and must have someone with them for 24 hours.    Special instructions:   DO NOT smoke tobacco or vape for 24 hours before your procedure.  Please read over the following fact sheets that you were given. Anesthesia Post-op Instructions and Care and Recovery After Surgery      Colonoscopy, Adult, Care After The following information offers guidance on how to care for yourself after your procedure. Your health care provider may also give you more specific instructions. If you have problems or questions, contact your health care provider. What can I expect after the procedure? After the procedure, it is common to have: A small amount of blood in your stool for 24 hours  after the procedure. Some gas. Mild cramping or bloating of your abdomen. Follow these instructions at home: Eating and drinking  Drink enough fluid to keep your urine pale yellow. Follow instructions from your health care provider about eating or drinking restrictions. Resume your normal diet as told by your health care provider. Avoid heavy or fried foods that are hard to digest. Activity Rest as told by your health care provider. Avoid sitting for a long time without moving. Get up to take short walks every 1-2 hours. This is important to improve blood flow and breathing. Ask for help if you feel weak or unsteady. Return to your normal activities as told by your health care provider. Ask your health care provider what activities are safe for you. Managing cramping and bloating  Try walking around when you have cramps or feel bloated. If directed, apply heat to your abdomen as told by your health care provider. Use the heat source that your health care provider recommends, such as a moist heat pack or a heating pad. Place a towel between your skin and the heat source. Leave the heat on for 20-30 minutes. Remove the heat if your skin turns bright red. This is especially important if you are unable to feel pain, heat, or cold. You have a greater risk of getting burned.  General instructions If you were given a sedative during the procedure, it can affect you for several hours. Do not drive or operate machinery until your health care provider says that it is safe. For the first 24 hours after the procedure: Do not sign important documents. Do not drink alcohol. Do your regular daily activities at a slower pace than normal. Eat soft foods that are easy to digest. Take over-the-counter and prescription medicines only as told by your health care provider. Keep all follow-up visits. This is important. Contact a health care provider if: You have blood in your stool 2-3 days after the  procedure. Get help right away if: You have more than a small spotting of blood in your stool. You have large blood clots in your stool. You have swelling of your abdomen. You have nausea or vomiting. You have a fever. You have increasing pain in your abdomen that is not relieved with medicine. These symptoms may be an emergency. Get help right away. Call 911. Do not wait to see if the symptoms will go away. Do not drive yourself to the hospital. Summary After the procedure, it is common to have a small amount of blood in your stool. You may also have mild cramping and bloating of your abdomen. If you were given a sedative during the procedure, it can affect you for several hours. Do not drive or operate machinery until your health care provider says that it is safe. Get help right away if you have a lot of blood in your stool, nausea or vomiting, a fever, or increased pain in your abdomen. This information is not intended to replace advice given to you by your health care provider. Make sure you discuss any questions you have with your health care provider. Document Revised: 11/10/2020 Document Reviewed: 11/10/2020 Elsevier Patient Education  [place] After This sheet gives you information about how to care for yourself after your procedure. Your health care provider may also give you more specific instructions. If you have problems or questions, contact your health care provider. What can I expect after the procedure? After the procedure, it is common to have: Tiredness. Forgetfulness about what happened after the procedure. Impaired judgment for important decisions. Nausea or vomiting. Some difficulty with balance. Follow these instructions at home: For the time period you were told by your health care provider:     Rest as needed. Do not participate in activities where you could fall or become injured. Do not drive or use machinery. Do  not drink alcohol. Do not take sleeping pills or medicines that cause drowsiness. Do not make important decisions or sign legal documents. Do not take care of children on your own. Eating and drinking Follow the diet that is recommended by your health care provider. Drink enough fluid to keep your urine pale yellow. If you vomit: Drink water, juice, or soup when you can drink without vomiting. Make sure you have little or no nausea before eating solid foods. General instructions Have a responsible adult stay with you for the time you are told. It is important to have someone help care for you until you are awake and alert. Take over-the-counter and prescription medicines only as told by your health care provider. If you have sleep apnea, surgery and certain medicines can increase your risk for breathing problems. Follow instructions from your health care provider about wearing your sleep device: Anytime you are sleeping, including during daytime naps. While taking  prescription pain medicines, sleeping medicines, or medicines that make you drowsy. Avoid smoking. Keep all follow-up visits as told by your health care provider. This is important. Contact a health care provider if: You keep feeling nauseous or you keep vomiting. You feel light-headed. You are still sleepy or having trouble with balance after 24 hours. You develop a rash. You have a fever. You have redness or swelling around the IV site. Get help right away if: You have trouble breathing. You have new-onset confusion at home. Summary For several hours after your procedure, you may feel tired. You may also be forgetful and have poor judgment. Have a responsible adult stay with you for the time you are told. It is important to have someone help care for you until you are awake and alert. Rest as told. Do not drive or operate machinery. Do not drink alcohol or take sleeping pills. Get help right away if you have trouble  breathing, or if you suddenly become confused. This information is not intended to replace advice given to you by your health care provider. Make sure you discuss any questions you have with your health care provider. Document Revised: 02/22/2021 Document Reviewed: 02/20/2019 Elsevier Patient Education  [place].

## 2021-11-10 ENCOUNTER — Encounter (HOSPITAL_COMMUNITY)
Admission: RE | Admit: 2021-11-10 | Discharge: 2021-11-10 | Disposition: A | Discharge status: Home / Self Care | Payer: Medicare Other | Source: Ambulatory Visit | Attending: Internal Medicine | Admitting: Internal Medicine

## 2021-11-10 ENCOUNTER — Encounter: Payer: Self-pay | Admitting: *Deleted

## 2021-11-10 ENCOUNTER — Encounter (HOSPITAL_COMMUNITY): Payer: Self-pay

## 2021-11-10 ENCOUNTER — Telehealth: Payer: Self-pay | Admitting: *Deleted

## 2021-11-10 NOTE — Telephone Encounter (Signed)
Patient called to reschedule colonoscopy from 11/14/21 until 12/20/21 at 7:30 am. Will mail out new instructions.

## 2021-11-17 DIAGNOSIS — E119 Type 2 diabetes mellitus without complications: Secondary | ICD-10-CM | POA: Diagnosis not present

## 2021-11-17 DIAGNOSIS — Z7984 Long term (current) use of oral hypoglycemic drugs: Secondary | ICD-10-CM | POA: Diagnosis not present

## 2021-11-17 DIAGNOSIS — Z961 Presence of intraocular lens: Secondary | ICD-10-CM | POA: Diagnosis not present

## 2021-11-23 DIAGNOSIS — F17210 Nicotine dependence, cigarettes, uncomplicated: Secondary | ICD-10-CM | POA: Diagnosis not present

## 2021-11-23 DIAGNOSIS — R062 Wheezing: Secondary | ICD-10-CM | POA: Diagnosis not present

## 2021-11-28 DIAGNOSIS — E1165 Type 2 diabetes mellitus with hyperglycemia: Secondary | ICD-10-CM | POA: Diagnosis not present

## 2021-11-28 DIAGNOSIS — J449 Chronic obstructive pulmonary disease, unspecified: Secondary | ICD-10-CM | POA: Diagnosis not present

## 2021-11-29 DIAGNOSIS — Z5181 Encounter for therapeutic drug level monitoring: Secondary | ICD-10-CM | POA: Diagnosis not present

## 2021-11-29 DIAGNOSIS — Z79899 Other long term (current) drug therapy: Secondary | ICD-10-CM | POA: Diagnosis not present

## 2021-11-30 DIAGNOSIS — Z79899 Other long term (current) drug therapy: Secondary | ICD-10-CM | POA: Diagnosis not present

## 2021-12-07 DIAGNOSIS — M797 Fibromyalgia: Secondary | ICD-10-CM | POA: Diagnosis not present

## 2021-12-07 DIAGNOSIS — F17210 Nicotine dependence, cigarettes, uncomplicated: Secondary | ICD-10-CM | POA: Diagnosis not present

## 2021-12-07 DIAGNOSIS — K219 Gastro-esophageal reflux disease without esophagitis: Secondary | ICD-10-CM | POA: Diagnosis not present

## 2021-12-07 DIAGNOSIS — E1165 Type 2 diabetes mellitus with hyperglycemia: Secondary | ICD-10-CM | POA: Diagnosis not present

## 2021-12-07 DIAGNOSIS — F17200 Nicotine dependence, unspecified, uncomplicated: Secondary | ICD-10-CM | POA: Diagnosis not present

## 2021-12-07 DIAGNOSIS — J449 Chronic obstructive pulmonary disease, unspecified: Secondary | ICD-10-CM | POA: Diagnosis not present

## 2021-12-07 DIAGNOSIS — Z23 Encounter for immunization: Secondary | ICD-10-CM | POA: Diagnosis not present

## 2021-12-09 ENCOUNTER — Other Ambulatory Visit: Payer: Self-pay | Admitting: Gastroenterology

## 2021-12-09 DIAGNOSIS — K5904 Chronic idiopathic constipation: Secondary | ICD-10-CM

## 2021-12-13 ENCOUNTER — Other Ambulatory Visit: Payer: Self-pay | Admitting: Gastroenterology

## 2021-12-13 DIAGNOSIS — K219 Gastro-esophageal reflux disease without esophagitis: Secondary | ICD-10-CM

## 2021-12-13 NOTE — Patient Instructions (Signed)
ELFREIDA HEGGS  12/13/2021     '@[email or web address]'$ @   Your procedure is scheduled on  12/20/2021.   Report to [facility] at  0600  A.M.   Call this number if you have problems the morning of surgery:  [phone]   Remember:  Follow the diet and prep instructions given to you by the office.      Use your inhaler before you come and bring your rescue inhaler with you.     Take these medicines the morning of surgery with A SIP OF WATER          abilify, buspar, zyrtec, flexeril(if needed), luvox, neurontin, pamelor, aciphex, imitrex(if needed), topamax.     Do not wear jewelry, make-up or nail polish.  Do not wear lotions, powders, or perfumes, or deodorant.  Do not shave 48 hours prior to surgery.  Men may shave face and neck.  Do not bring valuables to the hospital.  [facility] is not responsible for any belongings or valuables.  Contacts, dentures or bridgework may not be worn into surgery.  Leave your suitcase in the car.  After surgery it may be brought to your room.  For patients admitted to the hospital, discharge time will be determined by your treatment team.  Patients discharged the day of surgery will not be allowed to drive home and must have someone with them for 24 hours.    Special instructions:   DO NOT smoke tobacco or vape for 24 hors before your procedure.  Please read over the following fact sheets that you were given. Anesthesia Post-op Instructions and Care and Recovery After Surgery      Colonoscopy, Adult, Care After The following information offers guidance on how to care for yourself after your procedure. Your health care provider may also give you more specific instructions. If you have problems or questions, contact your health care provider. What can I expect after the procedure? After the procedure, it is common to have: A small amount of blood in your stool for 24 hours after the procedure. Some gas. Mild cramping or bloating  of your abdomen. Follow these instructions at home: Eating and drinking  Drink enough fluid to keep your urine pale yellow. Follow instructions from your health care provider about eating or drinking restrictions. Resume your normal diet as told by your health care provider. Avoid heavy or fried foods that are hard to digest. Activity Rest as told by your health care provider. Avoid sitting for a long time without moving. Get up to take short walks every 1-2 hours. This is important to improve blood flow and breathing. Ask for help if you feel weak or unsteady. Return to your normal activities as told by your health care provider. Ask your health care provider what activities are safe for you. Managing cramping and bloating  Try walking around when you have cramps or feel bloated. If directed, apply heat to your abdomen as told by your health care provider. Use the heat source that your health care provider recommends, such as a moist heat pack or a heating pad. Place a towel between your skin and the heat source. Leave the heat on for 20-30 minutes. Remove the heat if your skin turns bright red. This is especially important if you are unable to feel pain, heat, or cold. You have a greater risk of getting burned. General instructions If you were given a sedative during the procedure, it can affect you  for several hours. Do not drive or operate machinery until your health care provider says that it is safe. For the first 24 hours after the procedure: Do not sign important documents. Do not drink alcohol. Do your regular daily activities at a slower pace than normal. Eat soft foods that are easy to digest. Take over-the-counter and prescription medicines only as told by your health care provider. Keep all follow-up visits. This is important. Contact a health care provider if: You have blood in your stool 2-3 days after the procedure. Get help right away if: You have more than a small  spotting of blood in your stool. You have large blood clots in your stool. You have swelling of your abdomen. You have nausea or vomiting. You have a fever. You have increasing pain in your abdomen that is not relieved with medicine. These symptoms may be an emergency. Get help right away. Call 911. Do not wait to see if the symptoms will go away. Do not drive yourself to the hospital. Summary After the procedure, it is common to have a small amount of blood in your stool. You may also have mild cramping and bloating of your abdomen. If you were given a sedative during the procedure, it can affect you for several hours. Do not drive or operate machinery until your health care provider says that it is safe. Get help right away if you have a lot of blood in your stool, nausea or vomiting, a fever, or increased pain in your abdomen. This information is not intended to replace advice given to you by your health care provider. Make sure you discuss any questions you have with your health care provider. Document Revised: 11/10/2020 Document Reviewed: 11/10/2020 Elsevier Patient Education  [place] After This sheet gives you information about how to care for yourself after your procedure. Your health care provider may also give you more specific instructions. If you have problems or questions, contact your health care provider. What can I expect after the procedure? After the procedure, it is common to have: Tiredness. Forgetfulness about what happened after the procedure. Impaired judgment for important decisions. Nausea or vomiting. Some difficulty with balance. Follow these instructions at home: For the time period you were told by your health care provider:     Rest as needed. Do not participate in activities where you could fall or become injured. Do not drive or use machinery. Do not drink alcohol. Do not take sleeping pills or medicines  that cause drowsiness. Do not make important decisions or sign legal documents. Do not take care of children on your own. Eating and drinking Follow the diet that is recommended by your health care provider. Drink enough fluid to keep your urine pale yellow. If you vomit: Drink water, juice, or soup when you can drink without vomiting. Make sure you have little or no nausea before eating solid foods. General instructions Have a responsible adult stay with you for the time you are told. It is important to have someone help care for you until you are awake and alert. Take over-the-counter and prescription medicines only as told by your health care provider. If you have sleep apnea, surgery and certain medicines can increase your risk for breathing problems. Follow instructions from your health care provider about wearing your sleep device: Anytime you are sleeping, including during daytime naps. While taking prescription pain medicines, sleeping medicines, or medicines that make you drowsy. Avoid smoking. Keep all  follow-up visits as told by your health care provider. This is important. Contact a health care provider if: You keep feeling nauseous or you keep vomiting. You feel light-headed. You are still sleepy or having trouble with balance after 24 hours. You develop a rash. You have a fever. You have redness or swelling around the IV site. Get help right away if: You have trouble breathing. You have new-onset confusion at home. Summary For several hours after your procedure, you may feel tired. You may also be forgetful and have poor judgment. Have a responsible adult stay with you for the time you are told. It is important to have someone help care for you until you are awake and alert. Rest as told. Do not drive or operate machinery. Do not drink alcohol or take sleeping pills. Get help right away if you have trouble breathing, or if you suddenly become confused. This information  is not intended to replace advice given to you by your health care provider. Make sure you discuss any questions you have with your health care provider. Document Revised: 02/22/2021 Document Reviewed: 02/20/2019 Elsevier Patient Education  [place].

## 2021-12-15 ENCOUNTER — Encounter: Payer: Self-pay | Admitting: *Deleted

## 2021-12-15 ENCOUNTER — Telehealth: Payer: Self-pay | Admitting: *Deleted

## 2021-12-15 ENCOUNTER — Encounter (HOSPITAL_COMMUNITY)
Admission: RE | Admit: 2021-12-15 | Discharge: 2021-12-15 | Disposition: A | Discharge status: Home / Self Care | Payer: Medicare Other | Source: Ambulatory Visit | Attending: Internal Medicine | Admitting: Internal Medicine

## 2021-12-15 NOTE — Telephone Encounter (Signed)
Patient called and stated that she has a colonoscopy scheduled for 12/20/21 at 7:30 am and her pre-op appt is today. She called and stated that she has been nauseous and running a fever, no vomiting or diarrhea. She was informed to reschedule her procedure until a later date. Rescheduled until 01/09/22 at 8:15 am. New instructions and pre-op date will be sent to pt.

## 2022-01-02 ENCOUNTER — Telehealth: Payer: Self-pay | Admitting: *Deleted

## 2022-01-02 NOTE — Patient Instructions (Signed)
MONIQUE HEFTY  01/02/2022     '@PREFPERIOPPHARMACY'$ @   Your procedure is scheduled on 01/09/22.  Report to [facility] at [place].  Call this number if you have problems the morning of surgery:  [phone]   Remember:  Do not eat or drink after midnight.                 Take these medicines the morning of surgery with A SIP OF WATER abilify, buspar, zyrtec, flexeril, topamax, luvox, neurontin, pamelor, aciphex & imitrex    Do not wear jewelry, make-up or nail polish.  Do not wear lotions, powders, or perfumes, or deodorant.  Do not shave 48 hours prior to surgery.  Men may shave face and neck.  Do not bring valuables to the hospital.  [facility] is not responsible for any belongings or valuables.  Contacts, dentures or bridgework may not be worn into surgery.  Leave your suitcase in the car.  After surgery it may be brought to your room.  For patients admitted to the hospital, discharge time will be determined by your treatment team.  Patients discharged the day of surgery will not be allowed to drive home.   Name and phone number of your driver:   family Special instructions:  Follow all the diet & prep instructions provided from the office.  Please read over the following fact sheets that you were given. Anesthesia Post-op Instructions and Care and Recovery After Surgery      PATIENT INSTRUCTIONS POST-ANESTHESIA  IMMEDIATELY FOLLOWING SURGERY:  Do not drive or operate machinery for the first twenty four hours after surgery.  Do not make any important decisions for twenty four hours after surgery or while taking narcotic pain medications or sedatives.  If you develop intractable nausea and vomiting or a severe headache please notify your doctor immediately.  FOLLOW-UP:  Please make an appointment with your surgeon as instructed. You do not need to follow up with anesthesia unless specifically instructed to do so.  WOUND CARE INSTRUCTIONS (if applicable):  Keep a dry  clean dressing on the anesthesia/puncture wound site if there is drainage.  Once the wound has quit draining you may leave it open to air.  Generally you should leave the bandage intact for twenty four hours unless there is drainage.  If the epidural site drains for more than 36-48 hours please call the anesthesia department.  QUESTIONS?:  Please feel free to call your physician or the hospital operator if you have any questions, and they will be happy to assist you.      Monitored Anesthesia Care Anesthesia refers to techniques, procedures, and medicines that help a person stay safe and comfortable during a medical or dental procedure. Monitored anesthesia care, or sedation, is one type of anesthesia. Your anesthesia specialist may recommend sedation if you will be having a procedure that does not require you to be unconscious. You may have this procedure for: Cataract surgery. A dental procedure. A biopsy. A colonoscopy. During the procedure, you may receive a medicine to help you relax (sedative). There are three levels of sedation: Mild sedation. At this level, you may feel awake and relaxed. You will be able to follow directions. Moderate sedation. At this level, you will be sleepy. You may not remember the procedure. Deep sedation. At this level, you will be asleep. You will not remember the procedure. The more medicine you are given, the deeper your level of sedation will be. Depending on how you  respond to the procedure, the anesthesia specialist may change your level of sedation or the type of anesthesia to fit your needs. An anesthesia specialist will monitor you closely during the procedure. Tell a health care provider about: Any allergies you have. All medicines you are taking, including vitamins, herbs, eye drops, creams, and over-the-counter medicines. Any problems you or family members have had with anesthetic medicines. Any blood disorders you have. Any surgeries you have  had. Any medical conditions you have, such as sleep apnea. Whether you are pregnant or may be pregnant. Whether you use cigarettes, alcohol, or drugs. Any use of steroids, whether by mouth or as a cream. What are the risks? Generally, this is a safe procedure. However, problems may occur, including: Getting too much medicine (oversedation). Nausea. Allergic reaction to medicines. Trouble breathing. If this happens, a breathing tube may be used to help with breathing. It will be removed when you are awake and breathing on your own. Heart trouble. Lung trouble. Confusion that gets better with time (emergence delirium). What happens before the procedure? Staying hydrated Follow instructions from your health care provider about hydration, which may include: Up to 2 hours before the procedure - you may continue to drink clear liquids, such as water, clear fruit juice, black coffee, and plain tea. Eating and drinking restrictions Follow instructions from your health care provider about eating and drinking, which may include: 8 hours before the procedure - stop eating heavy meals or foods, such as meat, fried foods, or fatty foods. 6 hours before the procedure - stop eating light meals or foods, such as toast or cereal. 6 hours before the procedure - stop drinking milk or drinks that contain milk. 2 hours before the procedure - stop drinking clear liquids. Medicines Ask your health care provider about: Changing or stopping your regular medicines. This is especially important if you are taking diabetes medicines or blood thinners. Taking medicines such as aspirin and ibuprofen. These medicines can thin your blood. Do not take these medicines unless your health care provider tells you to take them. Taking over-the-counter medicines, vitamins, herbs, and supplements. Tests and exams You will have a physical exam. You may have blood tests done to show: How well your kidneys and liver are  working. How well your blood can clot. General instructions Plan to have a responsible adult take you home from the hospital or clinic. If you will be going home right after the procedure, plan to have a responsible adult care for you for the time you are told. This is important. What happens during the procedure?  Your blood pressure, heart rate, breathing, level of pain, and overall condition will be monitored. An IV will be inserted into one of your veins. You will be given medicines as needed to keep you comfortable during the procedure. This may mean changing the level of sedation. Depending on your age or the procedure, the sedative may be given: As a pill that you will swallow or as a pill that is inserted into the rectum. As an injection into the vein or muscle. As a spray through the nose. The procedure will be performed. Your breathing, heart rate, and blood pressure will be monitored during the procedure. When the procedure is over, the medicine will be stopped. The procedure may vary among health care providers and hospitals. What happens after the procedure? Your blood pressure, heart rate, breathing rate, and blood oxygen level will be monitored until you leave the hospital or clinic. You  may feel sleepy, clumsy, or nauseous. You may feel forgetful about what happened after the procedure. You may vomit. You may continue to get IV fluids. Do not drive or operate machinery until your health care provider says that it is safe. Summary Monitored anesthesia care is used to keep a patient comfortable during short procedures. Tell your health care provider about any allergies or health conditions you have and about all the medicines you are taking. Before the procedure, follow instructions about when to stop eating and drinking and about changing or stopping any medicines. Your blood pressure, heart rate, breathing rate, and blood oxygen level will be monitored until you leave the  hospital or clinic. Plan to have a responsible adult take you home from the hospital or clinic. This information is not intended to replace advice given to you by your health care provider. Make sure you discuss any questions you have with your health care provider. Document Revised: 02/22/2021 Document Reviewed: 02/20/2019 Elsevier Patient Education  [place]. Colonoscopy, Adult A colonoscopy is a procedure to look at the entire large intestine. This procedure is done using a long, thin, flexible tube that has a camera on the end. You may have a colonoscopy: As a part of normal colorectal screening. If you have certain symptoms, such as: A low number of red blood cells in your blood (anemia). Diarrhea that does not go away. Pain in your abdomen. Blood in your stool. A colonoscopy can help screen for and diagnose medical problems, including: An abnormal growth of cells or tissue (tumor). Abnormal growths within the lining of your intestine (polyps). Inflammation. Areas of bleeding. Tell your health care provider about: Any allergies you have. All medicines you are taking, including vitamins, herbs, eye drops, creams, and over-the-counter medicines. Any problems you or family members have had with anesthetic medicines. Any bleeding problems you have. Any surgeries you have had. Any medical conditions you have. Any problems you have had with having bowel movements. Whether you are pregnant or may be pregnant. What are the risks? Generally, this is a safe procedure. However, problems may occur, including: Bleeding. Damage to your intestine. Allergic reactions to medicines given during the procedure. Infection. This is rare. What happens before the procedure? Eating and drinking restrictions Follow instructions from your health care provider about eating or drinking restrictions, which may include: A few days before the procedure: Follow a low-fiber diet. Avoid nuts,  seeds, dried fruit, raw fruits, and vegetables. 1-3 days before the procedure: Eat only gelatin dessert or ice pops. Drink only clear liquids, such as water, clear juice, clear broth or bouillon, black coffee or tea, or clear soft drinks or sports drinks. Avoid liquids that contain red or purple dye. The day of the procedure: Do not eat solid foods. You may continue to drink clear liquids until up to 2 hours before the procedure. Do not eat or drink anything starting 2 hours before the procedure, or within the time period that your health care provider recommends. Bowel prep If you were prescribed a bowel prep to take by mouth (orally) to clean out your colon: Take it as told by your health care provider. Starting the day before your procedure, you will need to drink a large amount of liquid medicine. The liquid will cause you to have many bowel movements of loose stool until your stool becomes almost clear or light green. If your skin or the opening between the buttocks (anus) gets irritated from diarrhea, you may relieve  the irritation using: Wipes with medicine in them, such as adult wet wipes with aloe and vitamin E. A product to soothe skin, such as petroleum jelly. If you vomit while drinking the bowel prep: Take a break for up to 60 minutes. Begin the bowel prep again. Call your health care provider if you keep vomiting or you cannot take the bowel prep without vomiting. To clean out your colon, you may also be given: Laxative medicines. These help you have a bowel movement. Instructions for enema use. An enema is liquid medicine injected into your rectum. Medicines Ask your health care provider about: Changing or stopping your regular medicines or supplements. This is especially important if you are taking iron supplements, diabetes medicines, or blood thinners. Taking medicines such as aspirin and ibuprofen. These medicines can thin your blood. Do not take these medicines unless your  health care provider tells you to take them. Taking over-the-counter medicines, vitamins, herbs, and supplements. General instructions Ask your health care provider what steps will be taken to help prevent infection. These may include washing skin with a germ-killing soap. If you will be going home right after the procedure, plan to have a responsible adult: Take you home from the hospital or clinic. You will not be allowed to drive. Care for you for the time you are told. What happens during the procedure?  An IV will be inserted into one of your veins. You will be given a medicine to make you fall asleep (general anesthetic). You will lie on your side with your knees bent. A lubricant will be put on the tube. Then the tube will be: Inserted into your anus. Gently eased through all parts of your large intestine. Air will be sent into your colon to keep it open. This may cause some pressure or cramping. Images will be taken with the camera and will appear on a screen. A small tissue sample may be removed to be looked at under a microscope (biopsy). The tissue may be sent to a lab for testing if any signs of problems are found. If small polyps are found, they may be removed and checked for cancer cells. When the procedure is finished, the tube will be removed. The procedure may vary among health care providers and hospitals. What happens after the procedure? Your blood pressure, heart rate, breathing rate, and blood oxygen level will be monitored until you leave the hospital or clinic. You may have a small amount of blood in your stool. You may pass gas and have mild cramping or bloating in your abdomen. This is caused by the air that was used to open your colon during the exam. If you were given a sedative during the procedure, it can affect you for several hours. Do not drive or operate machinery until your health care provider says that it is safe. It is up to you to get the results of  your procedure. Ask your health care provider, or the department that is doing the procedure, when your results will be ready. Summary A colonoscopy is a procedure to look at the entire large intestine. Follow instructions from your health care provider about eating and drinking before the procedure. If you were prescribed an oral bowel prep to clean out your colon, take it as told by your health care provider. During the colonoscopy, a flexible tube with a camera on its end is inserted into the anus and then passed into all parts of the large intestine. This information is  not intended to replace advice given to you by your health care provider. Make sure you discuss any questions you have with your health care provider. Document Revised: 03/14/2021 Document Reviewed: 11/10/2020 Elsevier Patient Education  [place].

## 2022-01-02 NOTE — Telephone Encounter (Signed)
Pt called and states she would like to go back to pantoprazole, because she is having to many break through symptoms on rabeprazole. Please send prescription to [facility] in [place].

## 2022-01-03 ENCOUNTER — Other Ambulatory Visit: Payer: Self-pay | Admitting: Gastroenterology

## 2022-01-03 DIAGNOSIS — K219 Gastro-esophageal reflux disease without esophagitis: Secondary | ICD-10-CM

## 2022-01-03 MED ORDER — PANTOPRAZOLE SODIUM 40 MG PO TBEC: 40.0000 mg | DELAYED_RELEASE_TABLET | Freq: Two times a day (BID) | ORAL | 6 refills | Status: DC

## 2022-01-03 NOTE — Telephone Encounter (Signed)
Noted  

## 2022-01-05 ENCOUNTER — Encounter (HOSPITAL_COMMUNITY): Payer: Self-pay

## 2022-01-05 ENCOUNTER — Encounter (HOSPITAL_COMMUNITY)
Admission: RE | Admit: 2022-01-05 | Discharge: 2022-01-05 | Disposition: A | Discharge status: Home / Self Care | Payer: Medicare Other | Source: Ambulatory Visit | Attending: Internal Medicine | Admitting: Internal Medicine

## 2022-01-05 DIAGNOSIS — E119 Type 2 diabetes mellitus without complications: Secondary | ICD-10-CM | POA: Insufficient documentation

## 2022-01-05 DIAGNOSIS — Z01812 Encounter for preprocedural laboratory examination: Secondary | ICD-10-CM | POA: Insufficient documentation

## 2022-01-05 HISTORY — DX: Other cervical disc degeneration, unspecified cervical region: M50.30

## 2022-01-05 HISTORY — DX: Polyneuropathy, unspecified: G62.9

## 2022-01-05 HISTORY — DX: Gastro-esophageal reflux disease without esophagitis: K21.9

## 2022-01-05 HISTORY — DX: Carpal tunnel syndrome, unspecified upper limb: G56.00

## 2022-01-05 LAB — BASIC METABOLIC PANEL
Anion gap: 12 (ref 5–15)
BUN: 12 mg/dL (ref 6–20)
CO2: 26 mmol/L (ref 22–32)
Calcium: 9.9 mg/dL (ref 8.9–10.3)
Chloride: 99 mmol/L (ref 98–111)
Creatinine, Ser: 0.82 mg/dL (ref 0.44–1.00)
GFR, Estimated: >60 mL/min (ref >60)
Glucose, Bld: 147 mg/dL — ABNORMAL HIGH (ref 70–99)
Potassium: 4.1 mmol/L (ref 3.5–5.1)
Sodium: 137 mmol/L (ref 135–145)

## 2022-01-06 ENCOUNTER — Encounter (HOSPITAL_COMMUNITY): Payer: Self-pay | Admitting: Anesthesiology

## 2022-01-06 DIAGNOSIS — E1165 Type 2 diabetes mellitus with hyperglycemia: Secondary | ICD-10-CM | POA: Diagnosis not present

## 2022-01-06 DIAGNOSIS — J449 Chronic obstructive pulmonary disease, unspecified: Secondary | ICD-10-CM | POA: Diagnosis not present

## 2022-01-09 ENCOUNTER — Telehealth: Payer: Self-pay | Admitting: Internal Medicine

## 2022-01-09 ENCOUNTER — Ambulatory Visit (HOSPITAL_COMMUNITY)
Admission: RE | Admit: 2022-01-09 | Discharge: 2022-01-09 | Disposition: A | Discharge status: Home / Self Care | Payer: Medicare Other | Source: Ambulatory Visit | Attending: Internal Medicine | Admitting: Internal Medicine

## 2022-01-09 ENCOUNTER — Encounter (HOSPITAL_COMMUNITY): Payer: Self-pay

## 2022-01-09 ENCOUNTER — Encounter (HOSPITAL_COMMUNITY)
Admission: RE | Disposition: A | Discharge status: Home / Self Care | Payer: Self-pay | Source: Ambulatory Visit | Attending: Internal Medicine

## 2022-01-09 DIAGNOSIS — Z1211 Encounter for screening for malignant neoplasm of colon: Secondary | ICD-10-CM

## 2022-01-09 LAB — GLUCOSE, CAPILLARY: Glucose-Capillary: 131 mg/dL — ABNORMAL HIGH (ref 70–99)

## 2022-01-09 SURGERY — COLONOSCOPY WITH PROPOFOL
Anesthesia: Monitor Anesthesia Care

## 2022-01-09 MED ORDER — LACTATED RINGERS IV SOLN: INTRAVENOUS | Status: DC

## 2022-01-09 NOTE — Telephone Encounter (Signed)
Good morning, patient presented for colonoscopy this morning.  She is not quite cleaned out, still having brown liquid stools.  She would like to try tomorrow.  I told her to stay on clear liquids today.  Can we send her in another dose of prep?  Can we add her to my schedule for tomorrow?  Dr. Modesta Messing has deemed her okay to do in room 1 despite being ASA 3. Can you call patient after this is set up and let her know time slot/details?  Thank you

## 2022-01-09 NOTE — Telephone Encounter (Addendum)
Called pt. She was not able to have procedure done tomorrow. No transportation. She has rescheduled to 10/30 at 11:30am. Aware will send prep to pharmacy and mail new instructions.  She stated instead she completed suprep (4 bottles total) and started biscodyl 3 days before daily.  Please advise regarding prep

## 2022-01-09 NOTE — Telephone Encounter (Signed)
Called endo, pt already left. Called pt, LMOVM to call back

## 2022-01-09 NOTE — Telephone Encounter (Signed)
Called pt again and LMOVM

## 2022-01-11 NOTE — H&P (Signed)
Patient presented for colonoscopy though is not adequately prepped.  We will reschedule.

## 2022-01-13 MED ORDER — PEG 3350-KCL-NA BICARB-NACL 420 G PO SOLR: 4000.0000 mL | Freq: Once | ORAL | 0 refills | Status: AC

## 2022-01-13 NOTE — Addendum Note (Signed)
Addended by: Cheron Every on: 01/13/2022 08:47 AM   Modules accepted: Orders

## 2022-01-13 NOTE — Telephone Encounter (Signed)
PA approved via [facility]. Auth# [id number], DOS: Jan 30, 2022 - Apr 02, 2022

## 2022-01-17 ENCOUNTER — Ambulatory Visit (HOSPITAL_COMMUNITY)
Admission: RE | Admit: 2022-01-17 | Discharge: 2022-01-17 | Disposition: A | Discharge status: Home / Self Care | Payer: Medicare Other | Source: Ambulatory Visit | Attending: Gerontology | Admitting: Gerontology

## 2022-01-17 DIAGNOSIS — N6310 Unspecified lump in the right breast, unspecified quadrant: Secondary | ICD-10-CM

## 2022-01-17 DIAGNOSIS — N6011 Diffuse cystic mastopathy of right breast: Secondary | ICD-10-CM | POA: Diagnosis not present

## 2022-01-17 DIAGNOSIS — Z043 Encounter for examination and observation following other accident: Secondary | ICD-10-CM | POA: Insufficient documentation

## 2022-01-17 DIAGNOSIS — R92323 Mammographic fibroglandular density, bilateral breasts: Secondary | ICD-10-CM | POA: Diagnosis not present

## 2022-01-17 DIAGNOSIS — N6001 Solitary cyst of right breast: Secondary | ICD-10-CM | POA: Diagnosis not present

## 2022-01-18 DIAGNOSIS — M47812 Spondylosis without myelopathy or radiculopathy, cervical region: Secondary | ICD-10-CM | POA: Diagnosis not present

## 2022-01-18 DIAGNOSIS — M542 Cervicalgia: Secondary | ICD-10-CM | POA: Diagnosis not present

## 2022-01-18 DIAGNOSIS — M48061 Spinal stenosis, lumbar region without neurogenic claudication: Secondary | ICD-10-CM | POA: Diagnosis not present

## 2022-01-18 DIAGNOSIS — R519 Headache, unspecified: Secondary | ICD-10-CM | POA: Diagnosis not present

## 2022-01-18 DIAGNOSIS — M4602 Spinal enthesopathy, cervical region: Secondary | ICD-10-CM | POA: Diagnosis not present

## 2022-01-18 DIAGNOSIS — G5600 Carpal tunnel syndrome, unspecified upper limb: Secondary | ICD-10-CM | POA: Diagnosis not present

## 2022-01-18 DIAGNOSIS — G629 Polyneuropathy, unspecified: Secondary | ICD-10-CM | POA: Diagnosis not present

## 2022-01-18 DIAGNOSIS — Z79899 Other long term (current) drug therapy: Secondary | ICD-10-CM | POA: Diagnosis not present

## 2022-01-19 ENCOUNTER — Telehealth: Payer: Self-pay | Admitting: *Deleted

## 2022-01-19 NOTE — Telephone Encounter (Signed)
Pt called and left message regarding prep issues about her Metformin. Advised pt to take half dose of Metformin the night before. Pt verbalized understanding.

## 2022-01-25 ENCOUNTER — Encounter (HOSPITAL_COMMUNITY)
Admission: RE | Admit: 2022-01-25 | Discharge: 2022-01-25 | Disposition: A | Discharge status: Home / Self Care | Payer: Medicare Other | Source: Ambulatory Visit | Attending: Internal Medicine | Admitting: Internal Medicine

## 2022-01-26 ENCOUNTER — Other Ambulatory Visit: Payer: Self-pay

## 2022-01-26 ENCOUNTER — Encounter (HOSPITAL_COMMUNITY): Payer: Self-pay

## 2022-01-26 DIAGNOSIS — Z79891 Long term (current) use of opiate analgesic: Secondary | ICD-10-CM | POA: Diagnosis not present

## 2022-01-26 DIAGNOSIS — Z79899 Other long term (current) drug therapy: Secondary | ICD-10-CM | POA: Diagnosis not present

## 2022-01-30 ENCOUNTER — Encounter (HOSPITAL_COMMUNITY)
Admission: RE | Disposition: A | Discharge status: Home / Self Care | Payer: Self-pay | Source: Ambulatory Visit | Attending: Internal Medicine

## 2022-01-30 ENCOUNTER — Other Ambulatory Visit: Payer: Self-pay

## 2022-01-30 ENCOUNTER — Ambulatory Visit (HOSPITAL_COMMUNITY)
Admission: RE | Admit: 2022-01-30 | Discharge: 2022-01-30 | Disposition: A | Discharge status: Home / Self Care | Payer: Medicare Other | Source: Ambulatory Visit | Attending: Internal Medicine | Admitting: Internal Medicine

## 2022-01-30 ENCOUNTER — Ambulatory Visit (HOSPITAL_COMMUNITY): Payer: Medicare Other | Admitting: Anesthesiology

## 2022-01-30 ENCOUNTER — Ambulatory Visit (HOSPITAL_BASED_OUTPATIENT_CLINIC_OR_DEPARTMENT_OTHER): Payer: Medicare Other | Admitting: Anesthesiology

## 2022-01-30 DIAGNOSIS — M797 Fibromyalgia: Secondary | ICD-10-CM | POA: Diagnosis not present

## 2022-01-30 DIAGNOSIS — D123 Benign neoplasm of transverse colon: Secondary | ICD-10-CM

## 2022-01-30 DIAGNOSIS — R519 Headache, unspecified: Secondary | ICD-10-CM | POA: Insufficient documentation

## 2022-01-30 DIAGNOSIS — Z1211 Encounter for screening for malignant neoplasm of colon: Secondary | ICD-10-CM

## 2022-01-30 DIAGNOSIS — G709 Myoneural disorder, unspecified: Secondary | ICD-10-CM | POA: Insufficient documentation

## 2022-01-30 DIAGNOSIS — K648 Other hemorrhoids: Secondary | ICD-10-CM | POA: Insufficient documentation

## 2022-01-30 DIAGNOSIS — Z7984 Long term (current) use of oral hypoglycemic drugs: Secondary | ICD-10-CM | POA: Insufficient documentation

## 2022-01-30 DIAGNOSIS — Z1212 Encounter for screening for malignant neoplasm of rectum: Secondary | ICD-10-CM

## 2022-01-30 DIAGNOSIS — K219 Gastro-esophageal reflux disease without esophagitis: Secondary | ICD-10-CM | POA: Insufficient documentation

## 2022-01-30 DIAGNOSIS — D122 Benign neoplasm of ascending colon: Secondary | ICD-10-CM | POA: Diagnosis not present

## 2022-01-30 DIAGNOSIS — K635 Polyp of colon: Secondary | ICD-10-CM | POA: Diagnosis not present

## 2022-01-30 DIAGNOSIS — F418 Other specified anxiety disorders: Secondary | ICD-10-CM | POA: Insufficient documentation

## 2022-01-30 DIAGNOSIS — Q438 Other specified congenital malformations of intestine: Secondary | ICD-10-CM | POA: Diagnosis not present

## 2022-01-30 DIAGNOSIS — D124 Benign neoplasm of descending colon: Secondary | ICD-10-CM

## 2022-01-30 DIAGNOSIS — J449 Chronic obstructive pulmonary disease, unspecified: Secondary | ICD-10-CM | POA: Diagnosis not present

## 2022-01-30 DIAGNOSIS — Z139 Encounter for screening, unspecified: Secondary | ICD-10-CM | POA: Diagnosis not present

## 2022-01-30 DIAGNOSIS — F17210 Nicotine dependence, cigarettes, uncomplicated: Secondary | ICD-10-CM | POA: Insufficient documentation

## 2022-01-30 DIAGNOSIS — E119 Type 2 diabetes mellitus without complications: Secondary | ICD-10-CM | POA: Diagnosis not present

## 2022-01-30 HISTORY — PX: COLONOSCOPY WITH PROPOFOL: SHX5780

## 2022-01-30 HISTORY — PX: SUBMUCOSAL TATTOO INJECTION: SHX6856

## 2022-01-30 LAB — GLUCOSE, CAPILLARY: Glucose-Capillary: 106 mg/dL — ABNORMAL HIGH (ref 70–99)

## 2022-01-30 SURGERY — COLONOSCOPY WITH PROPOFOL
Anesthesia: General

## 2022-01-30 MED ORDER — SPOT INK MARKER SYRINGE KIT
PACK | SUBMUCOSAL | Status: DC | PRN
  Administered 2022-01-30: 1 mL via SUBMUCOSAL

## 2022-01-30 MED ORDER — LACTATED RINGERS IV SOLN: INTRAVENOUS | Status: DC

## 2022-01-30 MED ORDER — PROPOFOL 10 MG/ML IV BOLUS
INTRAVENOUS | Status: DC | PRN
  Administered 2022-01-30: 100 mg via INTRAVENOUS

## 2022-01-30 MED ORDER — LIDOCAINE HCL (CARDIAC) PF 100 MG/5ML IV SOSY
PREFILLED_SYRINGE | INTRAVENOUS | Status: DC | PRN
  Administered 2022-01-30: 50 mg via INTRAVENOUS

## 2022-01-30 MED ORDER — PROPOFOL 500 MG/50ML IV EMUL
INTRAVENOUS | Status: DC | PRN
  Administered 2022-01-30: 150 ug/kg/min via INTRAVENOUS

## 2022-01-30 NOTE — Anesthesia Preprocedure Evaluation (Signed)
Anesthesia Evaluation  Patient identified by MRN, date of birth, ID band Patient awake    Reviewed: Allergy & Precautions, NPO status , Patient's Chart, lab work & pertinent test results  History of Anesthesia Complications (+) PONV and history of anesthetic complications  Airway Mallampati: II  TM Distance: >3 FB Neck ROM: Full    Dental  (+) Edentulous Upper, Edentulous Lower   Pulmonary shortness of breath, asthma , sleep apnea , COPD,  COPD inhaler, Current SmokerPatient did not abstain from smoking.,    Pulmonary exam normal breath sounds clear to auscultation       Cardiovascular Exercise Tolerance: Poor METS (lower extremity weakness): Normal cardiovascular exam Rhythm:Regular Rate:Normal     Neuro/Psych  Headaches, PSYCHIATRIC DISORDERS Anxiety Depression  Neuromuscular disease    GI/Hepatic Neg liver ROS, GERD  Medicated and Controlled,  Endo/Other  diabetes, Well Controlled, Type 2, Oral Hypoglycemic Agents  Renal/GU negative Renal ROS  negative genitourinary   Musculoskeletal  (+) Arthritis , Osteoarthritis,  Fibromyalgia -  Abdominal   Peds negative pediatric ROS (+)  Hematology negative hematology ROS (+)   Anesthesia Other Findings   Reproductive/Obstetrics negative OB ROS                             Anesthesia Physical Anesthesia Plan  ASA: 3  Anesthesia Plan: General   Post-op Pain Management: Minimal or no pain anticipated   Induction: Intravenous  PONV Risk Score and Plan: Propofol infusion  Airway Management Planned: Nasal Cannula and Natural Airway  Additional Equipment:   Intra-op Plan:   Post-operative Plan:   Informed Consent: I have reviewed the patients History and Physical, chart, labs and discussed the procedure including the risks, benefits and alternatives for the proposed anesthesia with the patient or authorized representative who has indicated  his/her understanding and acceptance.       Plan Discussed with: CRNA and Surgeon  Anesthesia Plan Comments:         Anesthesia Quick Evaluation

## 2022-01-30 NOTE — Discharge Instructions (Addendum)
  Colonoscopy Discharge Instructions  Read the instructions outlined below and refer to this sheet in the next few weeks. These discharge instructions provide you with general information on caring for yourself after you leave the hospital. Your doctor may also give you specific instructions. While your treatment has been planned according to the most current medical practices available, unavoidable complications occasionally occur.   ACTIVITY You may resume your regular activity, but move at a slower pace for the next 24 hours.  Take frequent rest periods for the next 24 hours.  Walking will help get rid of the air and reduce the bloated feeling in your belly (abdomen).  No driving for 24 hours (because of the medicine (anesthesia) used during the test).   Do not sign any important legal documents or operate any machinery for 24 hours (because of the anesthesia used during the test).  NUTRITION Drink plenty of fluids.  You may resume your normal diet as instructed by your doctor.  Begin with a light meal and progress to your normal diet. Heavy or fried foods are harder to digest and may make you feel sick to your stomach (nauseated).  Avoid alcoholic beverages for 24 hours or as instructed.  MEDICATIONS You may resume your normal medications unless your doctor tells you otherwise.  WHAT YOU CAN EXPECT TODAY Some feelings of bloating in the abdomen.  Passage of more gas than usual.  Spotting of blood in your stool or on the toilet paper.  IF YOU HAD POLYPS REMOVED DURING THE COLONOSCOPY: No aspirin products for 7 days or as instructed.  No alcohol for 7 days or as instructed.  Eat a soft diet for the next 24 hours.  FINDING OUT THE RESULTS OF YOUR TEST Not all test results are available during your visit. If your test results are not back during the visit, make an appointment with your caregiver to find out the results. Do not assume everything is normal if you have not heard from your  caregiver or the medical facility. It is important for you to follow up on all of your test results.  SEEK IMMEDIATE MEDICAL ATTENTION IF: You have more than a spotting of blood in your stool.  Your belly is swollen (abdominal distention).  You are nauseated or vomiting.  You have a temperature over 101.  You have abdominal pain or discomfort that is severe or gets worse throughout the day.   Your colonoscopy revealed numerous (>10) polyps, 4-5 were quite large.  I did sample a few of them.  Removal of these would require in-depth endoscopic resection at a tertiary care facility.  Another option would be to have surgical removal of your colon which would be more definitive treatment.  Await pathology results, office will contact you.    Elon Alas. Abbey Chatters, D.O. Gastroenterology and Hepatology [facility]

## 2022-01-30 NOTE — Anesthesia Postprocedure Evaluation (Signed)
Anesthesia Post Note  Patient: Alexis Harris  Procedure(s) Performed: COLONOSCOPY WITH PROPOFOL SUBMUCOSAL TATTOO INJECTION  Patient location during evaluation: Phase II Anesthesia Type: General Level of consciousness: awake and alert and oriented Pain management: pain level controlled Vital Signs Assessment: post-procedure vital signs reviewed and stable Respiratory status: spontaneous breathing, nonlabored ventilation and respiratory function stable Cardiovascular status: blood pressure returned to baseline and stable Postop Assessment: no apparent nausea or vomiting Anesthetic complications: no   No notable events documented.   Last Vitals:  Vitals:   01/30/22 1007 01/30/22 1125  BP: 120/80 112/84  Pulse: 87 74  Resp: 18 11  Temp: 36.4 C 36.4 C  SpO2: 97% 92%    Last Pain:  Vitals:   01/30/22 1125  TempSrc: Oral  PainSc: 0-No pain                 Orlando Devereux C [place] Esch

## 2022-01-30 NOTE — Op Note (Addendum)
[facility] Patient Name: Alexis Harris Procedure Date: 01/30/2022 10:12 AM MRN: [id number] Date of Birth: Aug 24, 1970 Attending MD: Elon Alas. Abbey Chatters , [place], [id number] CSN: [id number] Age: 51 Admit Type: Outpatient Procedure:                Colonoscopy Indications:              Screening for colorectal malignant neoplasm Providers:                Elon Alas. Abbey Chatters, DO, Charlsie Quest. Theda Sers RN, RN,                            Ladoris Gene Technician, Technician Referring MD:              Medicines:                See the Anesthesia note for documentation of the                            administered medications Complications:            No immediate complications. Estimated Blood Loss:     Estimated blood loss was minimal. Procedure:                Pre-Anesthesia Assessment:                           - The anesthesia plan was to use monitored                            anesthesia care (MAC).                           After obtaining informed consent, the colonoscope                            was passed under direct vision. Throughout the                            procedure, the patient's blood pressure, pulse, and                            oxygen saturations were monitored continuously. The                            PCF-HQ190L ([id number]) scope was introduced through                            the anus and advanced to the the cecum, identified                            by appendiceal orifice and ileocecal valve. The                            colonoscopy was technically difficult and complex                            due to  a redundant colon, significant looping and a                            tortuous colon. Successful completion of the                            procedure was aided by changing the patient to a                            supine position and applying abdominal pressure.                            The patient tolerated the procedure well. The                             quality of the bowel preparation was evaluated                            using the BBPS [facility] Bowel Preparation Scale)                            with scores of: Right Colon = 2 (minor amount of                            residual staining, small fragments of stool and/or                            opaque liquid, but mucosa seen well), Transverse                            Colon = 2 (minor amount of residual staining, small                            fragments of stool and/or opaque liquid, but mucosa                            seen well) and Left Colon = 2 (minor amount of                            residual staining, small fragments of stool and/or                            opaque liquid, but mucosa seen well). The total                            BBPS score equals 6. The quality of the bowel                            preparation was good. Scope In: 10:31:55 AM Scope Out: 11:21:53 AM Scope Withdrawal Time: 0 hours 26 minutes 0 seconds  Total Procedure Duration: 0 hours 49 minutes 58 seconds  Findings:      The perianal and digital rectal examinations  were normal.      Non-bleeding internal hemorrhoids were found during endoscopy.      Greater than 10 flat polyps were found in the descending colon,       transverse colon, ascending colon and cecum. The polyps were large in       size, 4 were >67m in diameter. A few smaller ones removerd with hot       snare. Area was tattooed with an injection of dye. This tattoo is       inconsequential however as numerous other large polyps were found in the       distal transverse colon and descending colon.      The transverse colon was significantly redundant. Advancing the scope       required changing the patient to a supine position and applying       abdominal pressure. Impression:               - Non-bleeding internal hemorrhoids.                           - Many large polyps in the descending colon, in the                             transverse colon, in the ascending colon and in the                            cecum.                           - Redundant colon.                           - No specimens collected. Moderate Sedation:      Per Anesthesia Care Recommendation:           - Patient has a contact number available for                            emergencies. The signs and symptoms of potential                            delayed complications were discussed with the                            patient. Return to normal activities tomorrow.                            Written discharge instructions were provided to the                            patient.                           - Resume previous diet.                           - Continue present medications.                           -  Await pathology results.                           - Will discuss further with patient. EMR of all of                            these polyps would be an extensive undertaking and                            would need referral to tertiary care center vs.                            subtotal colectomy. Consider genetic testing. Procedure Code(s):        --- Professional ---                           N0272, Colorectal cancer screening; colonoscopy on                            individual not meeting criteria for high risk Diagnosis Code(s):        --- Professional ---                           Z12.11, Encounter for screening for malignant                            neoplasm of colon                           D12.4, Benign neoplasm of descending colon                           D12.3, Benign neoplasm of transverse colon (hepatic                            flexure or splenic flexure)                           D12.2, Benign neoplasm of ascending colon                           D12.0, Benign neoplasm of cecum                           K64.8, Other hemorrhoids                           Q43.8, Other specified congenital malformations  of                            intestine CPT copyright 2022 American Medical Association. All rights reserved. The codes documented in this report are preliminary and upon coder review may  be revised to meet current compliance requirements. Elon Alas. Abbey Chatters, DO [place], DO 01/30/2022 11:33:31 AM This report has been signed electronically. Number of Addenda: 0
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
## 2022-01-30 NOTE — Transfer of Care (Signed)
Immediate Anesthesia Transfer of Care Note  Patient: DEVINNE EPSTEIN  Procedure(s) Performed: COLONOSCOPY WITH PROPOFOL SUBMUCOSAL TATTOO INJECTION  Patient Location: Short Stay  Anesthesia Type:General  Level of Consciousness: drowsy  Airway & Oxygen Therapy: Patient Spontanous Breathing  Post-op Assessment: Report given to RN and Post -op Vital signs reviewed and stable  Post vital signs: Reviewed and stable  Last Vitals:  Vitals Value Taken Time  BP 112/84 01/30/22 1125  Temp 36.4 C 01/30/22 1125  Pulse 74 01/30/22 1125  Resp 11 01/30/22 1125  SpO2 92 % 01/30/22 1125    Last Pain:  Vitals:   01/30/22 1125  TempSrc: Oral  PainSc: 0-No pain      Patients Stated Pain Goal: 7 (88/64/84 7207)  Complications: No notable events documented.

## 2022-01-31 LAB — SURGICAL PATHOLOGY

## 2022-02-01 ENCOUNTER — Encounter (HOSPITAL_COMMUNITY): Payer: Self-pay | Admitting: Internal Medicine

## 2022-02-06 DIAGNOSIS — E1165 Type 2 diabetes mellitus with hyperglycemia: Secondary | ICD-10-CM | POA: Diagnosis not present

## 2022-02-06 DIAGNOSIS — K219 Gastro-esophageal reflux disease without esophagitis: Secondary | ICD-10-CM | POA: Diagnosis not present

## 2022-02-13 ENCOUNTER — Telehealth: Payer: Self-pay | Admitting: Internal Medicine

## 2022-02-13 NOTE — Telephone Encounter (Signed)
Pt left a message saying she had a colonoscopy on 10/30 and was told someone would call her but she hasn't heard from our office.

## 2022-02-13 NOTE — Telephone Encounter (Signed)
Dr Abbey Chatters, Can you please read this pt's report and send it to me. The pt viewed it in her MyChart. I advised her where I seen where it was done but not reported to me but I apologized and advised her I will call her back today or tomorrow. Pt expressed understanding

## 2022-02-14 DIAGNOSIS — N3090 Cystitis, unspecified without hematuria: Secondary | ICD-10-CM | POA: Diagnosis not present

## 2022-02-14 DIAGNOSIS — R300 Dysuria: Secondary | ICD-10-CM | POA: Diagnosis not present

## 2022-02-15 NOTE — Telephone Encounter (Signed)
I have attempted to call patient without success to discuss her pathology results.  The few polyps I did remove were tubular adenomas which are precancerous.  The multiple large polyps still in her colon are likely at least tubular adenomas, possibly more advanced.  Would recommend that she have the majority of her colon removed surgically as well as referral to genetics for further testing.  If she would like to come in for an office visit to discuss further, please schedule her with me urgent spot okay. I would be happy to meet with her.    If she is amenable to surgical referral then please refer to [facility] to discuss further.  I did discuss case with Dr. Constance Haw previously.  Thank you

## 2022-02-15 NOTE — Telephone Encounter (Signed)
Noted   Will call tomorrow

## 2022-02-16 ENCOUNTER — Encounter: Payer: Self-pay | Admitting: *Deleted

## 2022-02-16 DIAGNOSIS — D126 Benign neoplasm of colon, unspecified: Secondary | ICD-10-CM

## 2022-02-16 NOTE — Telephone Encounter (Signed)
Referral sent to [facility]. 

## 2022-02-16 NOTE — Telephone Encounter (Signed)
Phoned and spoke with the pt and advised of her result note and recommendations. Pt is going to discuss with family and call back. She was given my direct line.

## 2022-02-16 NOTE — Telephone Encounter (Signed)
Dr. Abbey Chatters pt does want to proceed with referral and wants ov with you. Pt agrees to referral  [facility] please use a urgent spot for pt per Dr Wallene Dales pt needs referral to [facility]

## 2022-02-16 NOTE — Telephone Encounter (Signed)
noted 

## 2022-03-08 ENCOUNTER — Ambulatory Visit (INDEPENDENT_AMBULATORY_CARE_PROVIDER_SITE_OTHER): Payer: Medicare Other | Admitting: Internal Medicine

## 2022-03-08 ENCOUNTER — Encounter: Payer: Self-pay | Admitting: Internal Medicine

## 2022-03-08 VITALS — BP 113/80 | HR 94 | Temp 97.8°F | Ht 64.0 in | Wt 184.2 lb

## 2022-03-08 DIAGNOSIS — D122 Benign neoplasm of ascending colon: Secondary | ICD-10-CM | POA: Diagnosis not present

## 2022-03-08 DIAGNOSIS — E1165 Type 2 diabetes mellitus with hyperglycemia: Secondary | ICD-10-CM | POA: Diagnosis not present

## 2022-03-08 DIAGNOSIS — K5904 Chronic idiopathic constipation: Secondary | ICD-10-CM | POA: Diagnosis not present

## 2022-03-08 DIAGNOSIS — K219 Gastro-esophageal reflux disease without esophagitis: Secondary | ICD-10-CM | POA: Diagnosis not present

## 2022-03-08 NOTE — Patient Instructions (Signed)
I will reach out to Dr. Constance Haw your surgeon to discuss your case again.  I agree with your choice to have colon resection.  We will plan on flexible sigmoidoscopy in approximately 1 year.  I am going to refer you to a geneticist given the numerous polyps seen.  It was very nice seeing you again today.  I hope you have a Merry Christmas.  Dr. Abbey Chatters

## 2022-03-08 NOTE — Progress Notes (Signed)
Referring Provider: Carrolyn Meiers* Primary Care Physician:  Carrolyn Meiers, MD Primary GI:  Dr. Abbey Chatters  Chief Complaint  Patient presents with   Follow-up    Patient here today to follow up on recent tcs. She has occasional constipation, and denies any other gi issues.    HPI:   Alexis Harris is a 51 y.o. female who presents to the clinic today for follow-up visit.  She has a history of GERD relatively well-controlled on pantoprazole and famotidine.  Occasional breakthrough symptoms.  No dysphagia odynophagia.  EGD 04/18/2021 without endoscopic abnormality in the esophagus s/p empiric dilation up to 20 mm, gastritis s/p biopsy, normal duodenum. Biopsy normal.   Attempted colonoscopy at the same time with inadequate prep.  Underwent repeat colonoscopy 01/30/2022 with numerous large polyps greater than 3 cm in her ascending transverse and descending colon's.  Also had numerous other small polyps a few which were removed which were tubular adenomas.  [name] adopted, does not know family history.  Does not have children.  Recovered well from her colonoscopy.  Here to discuss results.  Past Medical History:  Diagnosis Date   Anxiety    Arthritis    Asthma    Back pain    Carpal tunnel syndrome    COPD (chronic obstructive pulmonary disease) (HCC)    Degenerative cervical disc    Depression    Diabetes mellitus without complication (HCC)    DJD (degenerative joint disease), lumbosacral    Fibromyalgia    GERD (gastroesophageal reflux disease)    Hypersomnia    IBS (irritable bowel syndrome)    Interstitial cystitis    Migraine    Migraines    Neuropathy    OCD (obsessive compulsive disorder)    PONV (postoperative nausea and vomiting)    PTSD (post-traumatic stress disorder)    Restless leg syndrome    Sleep apnea     Past Surgical History:  Procedure Laterality Date   ABDOMINAL HYSTERECTOMY     ABDOMINAL SURGERY     BALLOON DILATION N/A  04/18/2021   Procedure: BALLOON DILATION;  Surgeon: Eloise Harman, DO;  Location: [facility];  Service: Endoscopy;  Laterality: N/A;   BIOPSY  04/18/2021   Procedure: BIOPSY;  Surgeon: Eloise Harman, DO;  Location: [facility];  Service: Endoscopy;;   CATARACT EXTRACTION W/PHACO Right 11/10/2014   Procedure: CATARACT EXTRACTION PHACO AND INTRAOCULAR LENS PLACEMENT (IOC);  Surgeon: Rutherford Guys, MD;  Location: [facility];  Service: Ophthalmology;  Laterality: Right;  CDE 2.04   CATARACT EXTRACTION W/PHACO Left 11/24/2014   Procedure: CATARACT EXTRACTION PHACO AND INTRAOCULAR LENS PLACEMENT (IOC);  Surgeon: Rutherford Guys, MD;  Location: [facility];  Service: Ophthalmology;  Laterality: Left;  CDE:1.04   COLONOSCOPY WITH PROPOFOL N/A 01/30/2022   Procedure: COLONOSCOPY WITH PROPOFOL;  Surgeon: Eloise Harman, DO;  [facility]: [facility];  Service: Endoscopy;  Laterality: N/A;  11:30am, asa 3   ESOPHAGOGASTRODUODENOSCOPY  09/2015   Dr. Mitchell Heir, [place]: esopahgeal lumen appears dilated and diffusely coated with old food. s/p brushing/bx and dilation. Benign squamous mucosal showing surface mucosal necrosis and focal hyperkeratosis. no fungal elements or EOE   ESOPHAGOGASTRODUODENOSCOPY (EGD) WITH PROPOFOL N/A 04/18/2021   Procedure: ESOPHAGOGASTRODUODENOSCOPY (EGD) WITH PROPOFOL;  Surgeon: Eloise Harman, DO;  [facility];  Service: Endoscopy;  Laterality: N/A;   EXPLORATORY LAPAROTOMY     x3   FLEXIBLE SIGMOIDOSCOPY  04/18/2021   Procedure:  FLEXIBLE SIGMOIDOSCOPY;  Surgeon: Eloise Harman, DO;  [facility]: [facility];  Service: Endoscopy;;   interstim medical device implanted     in lower back-to control bladder   MOUTH SURGERY     SUBMUCOSAL TATTOO INJECTION  01/30/2022   Procedure: SUBMUCOSAL TATTOO INJECTION;  Surgeon: Eloise Harman, DO;  [facility]: [facility];  Service: Endoscopy;;    Current Outpatient Medications  Medication Sig Dispense  Refill   albuterol (VENTOLIN HFA) 108 (90 Base) MCG/ACT inhaler Inhale 2 puffs into the lungs every 6 (six) hours as needed for wheezing or shortness of breath.     ARIPiprazole (ABILIFY) 10 MG tablet Take 10 mg by mouth daily.     Ascorbic Acid (VITAMIN C) 1000 MG tablet Take 1,000 mg by mouth daily.     atorvastatin (LIPITOR) 20 MG tablet Take 20 mg by mouth at bedtime.     buprenorphine (BUTRANS) 10 MCG/HR PTWK Place 1 patch onto the skin every Sunday.     buPROPion (WELLBUTRIN SR) 150 MG 12 hr tablet Take 150 mg by mouth 2 (two) times daily.     busPIRone (BUSPAR) 15 MG tablet Take 15 mg by mouth 3 (three) times daily.     cetirizine (ZYRTEC) 10 MG tablet Take 10 mg by mouth daily.     CINNAMON PO Take 2 tablets by mouth daily.     cyclobenzaprine (FLEXERIL) 10 MG tablet Take 10 mg by mouth 3 (three) times daily as needed for muscle spasms.     DULERA 100-5 MCG/ACT AERO Inhale 2 puffs into the lungs 2 (two) times daily.  10   estradiol (ESTRACE) 0.1 MG/GM vaginal cream Place 1 Applicatorful vaginally every Monday, Wednesday, and Friday.     famotidine (PEPCID) 20 MG tablet Take 1 tablet (20 mg total) by mouth at bedtime. 90 tablet 3   fluvoxaMINE (LUVOX) 100 MG tablet Take 150 mg by mouth 2 (two) times daily.     furosemide (LASIX) 20 MG tablet Take 40 mg by mouth daily.     gabapentin (NEURONTIN) 400 MG capsule Take 400 mg by mouth 3 (three) times daily.     Green Tea, Camillia sinensis, (GREEN TEA PO) Take 1 tablet by mouth daily. MEGA-T Green Tea FAT BURNER     hydrOXYzine (ATARAX) 50 MG tablet Take 50-100 mg by mouth at bedtime as needed (sleep).     lamoTRIgine (LAMICTAL) 200 MG tablet Take 200 mg by mouth every evening.     lubiprostone (AMITIZA) 24 MCG capsule TAKE 1 CAPSULE (24 MCG TOTAL) BY MOUTH 2 (TWO) TIMES DAILY WITH A MEAL. 180 capsule 1   metFORMIN (GLUCOPHAGE) 500 MG tablet Take 500 mg by mouth 2 (two) times daily.     montelukast (SINGULAIR) 10 MG tablet Take 10 mg by mouth  at bedtime.     Multiple Vitamin (MULTIVITAMIN) tablet Take 1 tablet by mouth daily.     MYRBETRIQ 50 MG TB24 tablet Take 50 mg by mouth daily.     OMEGA 3 1200 MG CAPS Take 1,200 mg by mouth daily.     oxymetazoline (AFRIN) 0.05 % nasal spray Place 1 spray into both nostrils 2 (two) times daily as needed for congestion.     pantoprazole (PROTONIX) 40 MG tablet Take 1 tablet (40 mg total) by mouth 2 (two) times daily before a meal. 60 tablet 6   Phenylephrine-Acetaminophen (SINUS + HEADACHE PO) Take 2 tablets by mouth daily as needed (sinus headaches).     polyethylene  glycol powder (GLYCOLAX/MIRALAX) powder Take 17 g by mouth daily as needed for moderate constipation. For constipation  11   senna-docusate (SENOKOT-S) 8.6-50 MG tablet Take 2 tablets by mouth daily.     SUMAtriptan (IMITREX) 100 MG tablet Take 100 mg by mouth every 2 (two) hours as needed for migraine. *TAKE ONE TABLET BY MOUTH AT ONSET OF MIGRAINE. IF MIGRAINE PERSISTS AFTER 2 HOURS, TAKE ONE ADDITIONAL TABLET. *DO NOT EXCEED MORE THAN 2 TABLETS IN A 24 HOUR PERIOD AND DO NOT USE NO MORE THAN 5 TABLETS WITHIN THE PERIOD OF 1 WEEK*     topiramate (TOPAMAX) 50 MG tablet Take 150 mg by mouth daily.     traZODone (DESYREL) 50 MG tablet Take 50-150 mg by mouth at bedtime.     UNABLE TO FIND Apply topically as needed. Med Name: **HOME TENS UNIT: FOR LOWER CHRONIC BACK PAIN/BACK PROBLEMS      valACYclovir (VALTREX) 500 MG tablet Take 500 mg by mouth 2 (two) times daily as needed (outbreaks).     nortriptyline (PAMELOR) 25 MG capsule Take 25 mg by mouth daily. (Patient not taking: Reported on 03/08/2022)     No current facility-administered medications for this visit.    Allergies as of 03/08/2022 - Review Complete 03/08/2022  Allergen Reaction Noted   Advair diskus [fluticasone-salmeterol] Anaphylaxis 10/20/2011   Other Hives 12/20/2015   Codeine Nausea And Vomiting 10/20/2011   Penicillins Rash 10/20/2011    Family History   Adopted: Yes    Social History   Socioeconomic History   Marital status: Divorced    Spouse name: Not on file   Number of children: Not on file   Years of education: Not on file   Highest education level: Not on file  Occupational History   Not on file  Tobacco Use   Smoking status: Every Day    Packs/day: 0.50    Years: 25.00    Total pack years: 12.50    Types: Cigarettes   Smokeless tobacco: Never  Vaping Use   Vaping Use: Former  Substance and Sexual Activity   Alcohol use: Yes    Comment: occ   Drug use: No   Sexual activity: Yes    Birth control/protection: Surgical    Comment: hyst  Other Topics Concern   Not on file  Social History Narrative   Not on file   Social Determinants of Health   Financial Resource Strain: Medium Risk (08/25/2020)   Overall Financial Resource Strain (CARDIA)    Difficulty of Paying Living Expenses: Somewhat hard  Food Insecurity: Food Insecurity Present (08/25/2020)   Hunger Vital Sign    Worried About Running Out of Food in the Last Year: Sometimes true    Ran Out of Food in the Last Year: Sometimes true  Transportation Needs: No Transportation Needs (08/25/2020)   PRAPARE - Hydrologist (Medical): No    Lack of Transportation (Non-Medical): No  Physical Activity: Inactive (08/25/2020)   Exercise Vital Sign    Days of Exercise per Week: 0 days    Minutes of Exercise per Session: 0 min  Stress: Stress Concern Present (08/25/2020)   [place]    Feeling of Stress : Very much  Social Connections: Socially Isolated (08/25/2020)   Social Connection and Isolation Panel [NHANES]    Frequency of Communication with Friends and Family: Twice a week    Frequency of Social Gatherings with Friends and Family:  Once a week    Attends Religious Services: Never    Active Member of Clubs or Organizations: No    Attends Archivist Meetings:  Never    Marital Status: Divorced    Subjective: Review of Systems  Constitutional:  Negative for chills and fever.  HENT:  Negative for congestion and hearing loss.   Eyes:  Negative for blurred vision and double vision.  Respiratory:  Negative for cough and shortness of breath.   Cardiovascular:  Negative for chest pain and palpitations.  Gastrointestinal:  Positive for constipation and heartburn. Negative for abdominal pain, blood in stool, diarrhea, melena and vomiting.  Genitourinary:  Negative for dysuria and urgency.  Musculoskeletal:  Negative for joint pain and myalgias.  Skin:  Negative for itching and rash.  Neurological:  Negative for dizziness and headaches.  Psychiatric/Behavioral:  Negative for depression. The patient is not nervous/anxious.      Objective: BP 113/80 (BP Location: Left Arm, Patient Position: Sitting, Cuff Size: Large)   Pulse 94   Temp 97.8 F (36.6 C) (Temporal)   Ht '5\' 4"'$  (1.626 m)   Wt 184 lb 3.2 oz (83.6 kg)   BMI 31.62 kg/m  Physical Exam Constitutional:      Appearance: Normal appearance.  HENT:     Head: Normocephalic and atraumatic.  Eyes:     Extraocular Movements: Extraocular movements intact.     Conjunctiva/sclera: Conjunctivae normal.  Cardiovascular:     Rate and Rhythm: Normal rate and regular rhythm.  Pulmonary:     Effort: Pulmonary effort is normal.     Breath sounds: Normal breath sounds.  Abdominal:     General: Bowel sounds are normal.     Palpations: Abdomen is soft.  Musculoskeletal:        General: No swelling. Normal range of motion.     Cervical back: Normal range of motion and neck supple.  Skin:    General: Skin is warm and dry.     Coloration: Skin is not jaundiced.  Neurological:     General: No focal deficit present.     Mental Status: She is alert and oriented to person, place, and time.  Psychiatric:        Mood and Affect: Mood normal.        Behavior: Behavior normal.       Assessment: *Numerous large colon polyps *Chronic constipation *Chronic GERD  Plan: Discussed patient's colonoscopy results with her today.  She has numerous large colon polyps throughout her ascending colon, transverse colon, descending colon.  Discussed possible treatment options including referral to tertiary care center for extended colonoscopy with multiple EMR's and short interval colonoscopies therafter versus subtotal colectomy.  Patient would like to meet surgery to discuss colon resection.  She has a an appointment with Dr. Constance Haw in a few weeks.  I will reach out to Dr. Constance Haw again to rediscuss patient's case.  I did not see any polyps in her rectum or sigmoid colon.  Tattoo done on colonoscopy is inconsequential as there were large polyps distal to the tattoo.  Chronic GERD well-controlled on pantoprazole twice daily and famotidine.  Will continue.  Follow-up with GI in 6 months.  She will need flexible sigmoidoscopy 1 year after colon resection.   03/08/2022 4:17 PM   Disclaimer: This note was dictated with voice recognition software. Similar sounding words can inadvertently be transcribed and may not be corrected upon review.

## 2022-03-08 NOTE — Addendum Note (Signed)
Addended by: Cheron Every on: 03/08/2022 04:41 PM   Modules accepted: Orders

## 2022-03-23 ENCOUNTER — Ambulatory Visit (INDEPENDENT_AMBULATORY_CARE_PROVIDER_SITE_OTHER): Payer: Medicare Other | Admitting: General Surgery

## 2022-03-23 ENCOUNTER — Encounter: Payer: Self-pay | Admitting: General Surgery

## 2022-03-23 VITALS — BP 131/86 | HR 87 | Temp 97.8°F | Resp 14 | Ht 64.0 in | Wt 192.0 lb

## 2022-03-23 DIAGNOSIS — D123 Benign neoplasm of transverse colon: Secondary | ICD-10-CM

## 2022-03-23 DIAGNOSIS — D124 Benign neoplasm of descending colon: Secondary | ICD-10-CM | POA: Diagnosis not present

## 2022-03-23 DIAGNOSIS — D122 Benign neoplasm of ascending colon: Secondary | ICD-10-CM

## 2022-03-23 DIAGNOSIS — D369 Benign neoplasm, unspecified site: Secondary | ICD-10-CM

## 2022-03-23 MED ORDER — METRONIDAZOLE 500 MG PO TABS: 1000.0000 mg | ORAL_TABLET | ORAL | 0 refills | Status: DC

## 2022-03-23 MED ORDER — NEOMYCIN SULFATE 500 MG PO TABS: 1000.0000 mg | ORAL_TABLET | ORAL | 0 refills | Status: DC

## 2022-03-23 MED ORDER — PEG 3350-KCL-NA BICARB-NACL 420 G PO SOLR: 4000.0000 mL | Freq: Once | ORAL | 0 refills | Status: AC

## 2022-03-23 NOTE — Patient Instructions (Addendum)
Colon Preparation Get from the pharmacy- Golytely, flagyl and neomycin.   Two Days Prior to Surgery:  Take in Jug of Golytely throughout the day.   The Day Prior to Surgery: Only take in Clear liquids. Take in the second Jug of Golytely starting at 7AM and finishing by  2pm.  Drink plenty of clear liquids all day to avoid dehydration, no solid food.    Take 2 neomycin '500mg'$  tablets and 2 metronidazole '500mg'$  tablets at 2 pm. Take 2 neomycin '500mg'$  tablets and 2 metronidazole '500mg'$  tablets at 3pm. Take 2 neomycin '500mg'$  tablets and 2 metronidazole '500mg'$  tablets at 10pm.    Do not eat or drink anything after midnight the night before your surgery.  Do not eat or drink anything that morning, and take medications as instructed by the hospital staff on your preoperative visit.

## 2022-03-23 NOTE — Progress Notes (Signed)
[facility] History and Physical  Reason for Referral: Multiple polyps, tubular adenoma  Referring Physician: Dr. Abbey Chatters   Chief Complaint   New Patient (Initial Visit)     Alexis Harris is a 51 y.o. female.  HPI: Alexis Harris is [age] yo who comes in with multiple polyps found on colonoscopy that were throughout Alexis Harris ascending, transverse and descending colon. Alexis Harris has a history of some COPD, IBS, migraines, but denies any chest pain or SOB or cardiac issues. Alexis Harris does not want to pursue extensive colonoscopies at a tertiary care center to have all of these polyps removed. Dr. Abbey Chatters and Alexis Harris discussed total colectomy (removing down to the rectosigmoid junction and Alexis Harris agreed).   Past Medical History:  Diagnosis Date   Anxiety    Arthritis    Asthma    Back pain    Carpal tunnel syndrome    COPD (chronic obstructive pulmonary disease) (HCC)    Degenerative cervical disc    Depression    Diabetes mellitus without complication (HCC)    DJD (degenerative joint disease), lumbosacral    Fibromyalgia    GERD (gastroesophageal reflux disease)    Hypersomnia    IBS (irritable bowel syndrome)    Interstitial cystitis    Migraine    Migraines    Neuropathy    OCD (obsessive compulsive disorder)    PONV (postoperative nausea and vomiting)    PTSD (post-traumatic stress disorder)    Restless leg syndrome    Sleep apnea     Past Surgical History:  Procedure Laterality Date   ABDOMINAL HYSTERECTOMY     ABDOMINAL SURGERY     BALLOON DILATION N/A 04/18/2021   Procedure: BALLOON DILATION;  Surgeon: Eloise Harman, DO;  [facility];  Service: Endoscopy;  Laterality: N/A;   BIOPSY  04/18/2021   Procedure: BIOPSY;  Surgeon: Eloise Harman, DO;  [facility];  Service: Endoscopy;;   CATARACT EXTRACTION W/PHACO Right 11/10/2014   Procedure: CATARACT EXTRACTION PHACO AND INTRAOCULAR LENS PLACEMENT (IOC);  Surgeon: Rutherford Guys, MD;  Location: [facility];   Service: Ophthalmology;  Laterality: Right;  CDE 2.04   CATARACT EXTRACTION W/PHACO Left 11/24/2014   Procedure: CATARACT EXTRACTION PHACO AND INTRAOCULAR LENS PLACEMENT (IOC);  Surgeon: Rutherford Guys, MD;  Location: [facility];  Service: Ophthalmology;  Laterality: Left;  CDE:1.04   COLONOSCOPY WITH PROPOFOL N/A 01/30/2022   Procedure: COLONOSCOPY WITH PROPOFOL;  Surgeon: Eloise Harman, DO;  [facility];  Service: Endoscopy;  Laterality: N/A;  11:30am, asa 3   ESOPHAGOGASTRODUODENOSCOPY  09/2015   Dr. Mitchell Heir, [place]: esopahgeal lumen appears dilated and diffusely coated with old food. s/p brushing/bx and dilation. Benign squamous mucosal showing surface mucosal necrosis and focal hyperkeratosis. no fungal elements or EOE   ESOPHAGOGASTRODUODENOSCOPY (EGD) WITH PROPOFOL N/A 04/18/2021   Procedure: ESOPHAGOGASTRODUODENOSCOPY (EGD) WITH PROPOFOL;  Surgeon: Eloise Harman, DO;  [facility];  Service: Endoscopy;  Laterality: N/A;   EXPLORATORY LAPAROTOMY     x3   FLEXIBLE SIGMOIDOSCOPY  04/18/2021   Procedure: FLEXIBLE SIGMOIDOSCOPY;  Surgeon: Eloise Harman, DO;  [facility];  Service: Endoscopy;;   interstim medical device implanted     in lower back-to control bladder   MOUTH SURGERY     SUBMUCOSAL TATTOO INJECTION  01/30/2022   Procedure: SUBMUCOSAL TATTOO INJECTION;  Surgeon: Eloise Harman, DO;  [facility];  Service: Endoscopy;;    Family  History  Adopted: Yes    Social History   Tobacco Use   Smoking status: Every Day    Packs/day: 0.50    Years: 25.00    Total pack years: 12.50    Types: Cigarettes   Smokeless tobacco: Never  Vaping Use   Vaping Use: Former  Substance Use Topics   Alcohol use: Yes    Comment: occ   Drug use: No    Medications: I have reviewed the patient's current medications. Allergies as of 03/23/2022       Reactions   Advair Diskus [fluticasone-salmeterol] Anaphylaxis    Other Hives   wool   Codeine Nausea And Vomiting   Penicillins Rash        Medication List        Accurate as of March 23, 2022  2:26 PM. If you have any questions, ask your nurse or doctor.          STOP taking these medications    nortriptyline 25 MG capsule Commonly known as: PAMELOR Stopped by: Virl Cagey, MD       TAKE these medications    albuterol 108 (90 Base) MCG/ACT inhaler Commonly known as: VENTOLIN HFA Inhale 2 puffs into the lungs every 6 (six) hours as needed for wheezing or shortness of breath.   ARIPiprazole 10 MG tablet Commonly known as: ABILIFY Take 10 mg by mouth daily.   atorvastatin 20 MG tablet Commonly known as: LIPITOR Take 20 mg by mouth at bedtime.   buprenorphine 10 MCG/HR Ptwk Commonly known as: BUTRANS Place 1 patch onto the skin every Sunday.   buPROPion 150 MG 12 hr tablet Commonly known as: WELLBUTRIN SR Take 150 mg by mouth 2 (two) times daily.   busPIRone 15 MG tablet Commonly known as: BUSPAR Take 15 mg by mouth 3 (three) times daily.   cetirizine 10 MG tablet Commonly known as: ZYRTEC Take 10 mg by mouth daily.   CINNAMON PO Take 2 tablets by mouth daily.   cyclobenzaprine 10 MG tablet Commonly known as: FLEXERIL Take 10 mg by mouth 3 (three) times daily as needed for muscle spasms.   Dulera 100-5 MCG/ACT Aero Generic drug: mometasone-formoterol Inhale 2 puffs into the lungs 2 (two) times daily.   estradiol 0.1 MG/GM vaginal cream Commonly known as: ESTRACE Place 1 Applicatorful vaginally every Monday, Wednesday, and Friday.   famotidine 20 MG tablet Commonly known as: PEPCID Take 1 tablet (20 mg total) by mouth at bedtime.   fluvoxaMINE 100 MG tablet Commonly known as: LUVOX Take 150 mg by mouth 2 (two) times daily.   furosemide 20 MG tablet Commonly known as: LASIX Take 40 mg by mouth daily.   gabapentin 400 MG capsule Commonly known as: NEURONTIN Take 400 mg by mouth 3 (three)  times daily.   GREEN TEA PO Take 1 tablet by mouth daily. MEGA-T Green Tea FAT BURNER   hydrOXYzine 50 MG tablet Commonly known as: ATARAX Take 50-100 mg by mouth at bedtime as needed (sleep).   lamoTRIgine 200 MG tablet Commonly known as: LAMICTAL Take 200 mg by mouth every evening.   lubiprostone 24 MCG capsule Commonly known as: AMITIZA TAKE 1 CAPSULE (24 MCG TOTAL) BY MOUTH 2 (TWO) TIMES DAILY WITH A MEAL.   metFORMIN 500 MG tablet Commonly known as: GLUCOPHAGE Take 500 mg by mouth 2 (two) times daily.   montelukast 10 MG tablet Commonly known as: SINGULAIR Take 10 mg by mouth at bedtime.   multivitamin tablet Take  1 tablet by mouth daily.   Myrbetriq 50 MG Tb24 tablet Generic drug: mirabegron ER Take 50 mg by mouth daily.   Omega 3 1200 MG Caps Take 1,200 mg by mouth daily.   oxymetazoline 0.05 % nasal spray Commonly known as: AFRIN Place 1 spray into both nostrils 2 (two) times daily as needed for congestion.   pantoprazole 40 MG tablet Commonly known as: Protonix Take 1 tablet (40 mg total) by mouth 2 (two) times daily before a meal.   polyethylene glycol powder 17 GM/SCOOP powder Commonly known as: GLYCOLAX/MIRALAX Take 17 g by mouth daily as needed for moderate constipation. For constipation   senna-docusate 8.6-50 MG tablet Commonly known as: Senokot-S Take 2 tablets by mouth daily.   SINUS + HEADACHE PO Take 2 tablets by mouth daily as needed (sinus headaches).   SUMAtriptan 100 MG tablet Commonly known as: IMITREX Take 100 mg by mouth every 2 (two) hours as needed for migraine. *TAKE ONE TABLET BY MOUTH AT ONSET OF MIGRAINE. IF MIGRAINE PERSISTS AFTER 2 HOURS, TAKE ONE ADDITIONAL TABLET. *DO NOT EXCEED MORE THAN 2 TABLETS IN A 24 HOUR PERIOD AND DO NOT USE NO MORE THAN 5 TABLETS WITHIN THE PERIOD OF 1 WEEK*   topiramate 50 MG tablet Commonly known as: TOPAMAX Take 150 mg by mouth daily.   traZODone 50 MG tablet Commonly known as:  DESYREL Take 50-150 mg by mouth at bedtime.   UNABLE TO FIND Apply topically as needed. Med Name: **HOME TENS UNIT: FOR LOWER CHRONIC BACK PAIN/BACK PROBLEMS   valACYclovir 500 MG tablet Commonly known as: VALTREX Take 500 mg by mouth 2 (two) times daily as needed (outbreaks).   vitamin C 1000 MG tablet Take 1,000 mg by mouth daily.         ROS:  A comprehensive review of systems was negative except for: Respiratory: positive for asthma ,COPD, wheezing, SOB Gastrointestinal: positive for abdominal pain, constipation, nausea, and reflux symptoms Genitourinary: positive for frequency Musculoskeletal: positive for back pain, neck pain, and joint pain Neurological: positive for dizziness, headaches, and neuropathy Endocrine: positive for cold intolerance, tired, excessive thirst   Blood pressure 131/86, pulse 87, temperature 97.8 F (36.6 C), temperature source Oral, resp. rate 14, height '5\' 4"'$  (1.626 m), weight 192 lb (87.1 kg), SpO2 98 %. Physical Exam Vitals reviewed.  HENT:     Head: Normocephalic.     Nose: Nose normal.  Eyes:     Extraocular Movements: Extraocular movements intact.  Cardiovascular:     Rate and Rhythm: Normal rate and regular rhythm.  Pulmonary:     Effort: Pulmonary effort is normal.     Breath sounds: Normal breath sounds.  Abdominal:     General: There is no distension.     Palpations: Abdomen is soft.     Tenderness: There is no abdominal tenderness.  Musculoskeletal:        General: Normal range of motion.     Cervical back: Normal range of motion.  Skin:    General: Skin is warm.  Neurological:     General: No focal deficit present.     Mental Status: Alexis Harris is alert and oriented to person, place, and time.  Psychiatric:        Mood and Affect: Mood normal.        Behavior: Behavior normal.        Thought Content: Thought content normal.        Judgment: Judgment normal.     Results:  None  Assessment & Plan:  Alexis Harris is a 50  y.o. female with multiple polyps  that want to pursue colectomy. Alexis Harris also has severe constipation only having Bms weekly or less.   Discussed open colectomy given how much of the colon we are taking and risk of bleeding, infection, anastomotic leak, need for bowel prep mechanically and antibiotic prep, risk of injury to ureters. We discussed post operative course and that Alexis Harris will have loose Bms 6+ times a day with total colectomy and will need imodium for the rest of Alexis Harris life with the colectomy. Alexis Harris wants to proceed.   Colon Preparation Get from the pharmacy- Golytely, flagyl and neomycin.   Two Days Prior to Surgery:  Take in Jug of Golytely throughout the day.   The Day Prior to Surgery: Only take in Clear liquids. Take in the second Jug of Golytely starting at 7AM and finishing by  2pm.  Drink plenty of clear liquids all day to avoid dehydration, no solid food.    Take 2 neomycin '500mg'$  tablets and 2 metronidazole '500mg'$  tablets at 2 pm. Take 2 neomycin '500mg'$  tablets and 2 metronidazole '500mg'$  tablets at 3pm. Take 2 neomycin '500mg'$  tablets and 2 metronidazole '500mg'$  tablets at 10pm.    Do not eat or drink anything after midnight the night before your surgery.  Do not eat or drink anything that morning, and take medications as instructed by the hospital staff on your preoperative visit.   All questions were answered to the satisfaction of the patient.   Virl Cagey 03/23/2022, 2:27 PM

## 2022-03-24 NOTE — H&P (Signed)
[facility] History and Physical   Reason for Referral: Multiple polyps, tubular adenoma  Referring Physician: Dr. Abbey Harris    Chief Complaint   New Patient (Initial Visit)        Alexis Harris is a 51 y.o. female.  HPI: Alexis Harris is [age] yo who comes in with multiple polyps found on colonoscopy that were throughout her ascending, transverse and descending colon. She has a history of some COPD, IBS, migraines, but denies any chest pain or SOB or cardiac issues. She does not want to pursue extensive colonoscopies at a tertiary care center to have all of these polyps removed. Dr. Abbey Harris and her discussed total colectomy (removing down to the rectosigmoid junction and she agreed).        Past Medical History:  Diagnosis Date   Anxiety     Arthritis     Asthma     Back pain     Carpal tunnel syndrome     COPD (chronic obstructive pulmonary disease) (HCC)     Degenerative cervical disc     Depression     Diabetes mellitus without complication (HCC)     DJD (degenerative joint disease), lumbosacral     Fibromyalgia     GERD (gastroesophageal reflux disease)     Hypersomnia     IBS (irritable bowel syndrome)     Interstitial cystitis     Migraine     Migraines     Neuropathy     OCD (obsessive compulsive disorder)     PONV (postoperative nausea and vomiting)     PTSD (post-traumatic stress disorder)     Restless leg syndrome     Sleep apnea             Past Surgical History:  Procedure Laterality Date   ABDOMINAL HYSTERECTOMY       ABDOMINAL SURGERY       BALLOON DILATION N/A 04/18/2021    Procedure: BALLOON DILATION;  Surgeon: Eloise Harman, DO;  [facility];  Service: Endoscopy;  Laterality: N/A;   BIOPSY   04/18/2021    Procedure: BIOPSY;  Surgeon: Eloise Harman, DO;  [facility];  Service: Endoscopy;;   CATARACT EXTRACTION W/PHACO Right 11/10/2014    Procedure: CATARACT EXTRACTION PHACO AND INTRAOCULAR LENS PLACEMENT  (IOC);  Surgeon: Rutherford Guys, MD;  Location: [facility];  Service: Ophthalmology;  Laterality: Right;  CDE 2.04   CATARACT EXTRACTION W/PHACO Left 11/24/2014    Procedure: CATARACT EXTRACTION PHACO AND INTRAOCULAR LENS PLACEMENT (IOC);  Surgeon: Rutherford Guys, MD;  Location: [facility];  Service: Ophthalmology;  Laterality: Left;  CDE:1.04   COLONOSCOPY WITH PROPOFOL N/A 01/30/2022    Procedure: COLONOSCOPY WITH PROPOFOL;  Surgeon: Eloise Harman, DO;  [facility];  Service: Endoscopy;  Laterality: N/A;  11:30am, asa 3   ESOPHAGOGASTRODUODENOSCOPY   09/2015    Dr. Mitchell Heir, [place]: esopahgeal lumen appears dilated and diffusely coated with old food. s/p brushing/bx and dilation. Benign squamous mucosal showing surface mucosal necrosis and focal hyperkeratosis. no fungal elements or EOE   ESOPHAGOGASTRODUODENOSCOPY (EGD) WITH PROPOFOL N/A 04/18/2021    Procedure: ESOPHAGOGASTRODUODENOSCOPY (EGD) WITH PROPOFOL;  Surgeon: Eloise Harman, DO;  [facility];  Service: Endoscopy;  Laterality: N/A;   EXPLORATORY LAPAROTOMY        x3   FLEXIBLE SIGMOIDOSCOPY   04/18/2021    Procedure: FLEXIBLE SIGMOIDOSCOPY;  Surgeon: Eloise Harman, DO;  [facility];  Service: Endoscopy;;   interstim medical device implanted        in lower back-to control bladder   MOUTH SURGERY       SUBMUCOSAL TATTOO INJECTION   01/30/2022    Procedure: SUBMUCOSAL TATTOO INJECTION;  Surgeon: Eloise Harman, DO;  [facility];  Service: Endoscopy;;      Family History  Adopted: Yes      Social History         Tobacco Use   Smoking status: Every Day      Packs/day: 0.50      Years: 25.00      Total pack years: 12.50      Types: Cigarettes   Smokeless tobacco: Never  Vaping Use   Vaping Use: Former  Substance Use Topics   Alcohol use: Yes      Comment: occ   Drug use: No      Medications: I have reviewed the patient's current medications. Allergies  as of 03/23/2022         Reactions    Advair Diskus [fluticasone-salmeterol] Anaphylaxis    Other Hives    wool    Codeine Nausea And Vomiting    Penicillins Rash            Medication List           Accurate as of March 23, 2022  2:26 PM. If you have any questions, ask your nurse or doctor.              STOP taking these medications     nortriptyline 25 MG capsule Commonly known as: PAMELOR Stopped by: Virl Cagey, MD           TAKE these medications     albuterol 108 (90 Base) MCG/ACT inhaler Commonly known as: VENTOLIN HFA Inhale 2 puffs into the lungs every 6 (six) hours as needed for wheezing or shortness of breath.    ARIPiprazole 10 MG tablet Commonly known as: ABILIFY Take 10 mg by mouth daily.    atorvastatin 20 MG tablet Commonly known as: LIPITOR Take 20 mg by mouth at bedtime.    buprenorphine 10 MCG/HR Ptwk Commonly known as: BUTRANS Place 1 patch onto the skin every Sunday.    buPROPion 150 MG 12 hr tablet Commonly known as: WELLBUTRIN SR Take 150 mg by mouth 2 (two) times daily.    busPIRone 15 MG tablet Commonly known as: BUSPAR Take 15 mg by mouth 3 (three) times daily.    cetirizine 10 MG tablet Commonly known as: ZYRTEC Take 10 mg by mouth daily.    CINNAMON PO Take 2 tablets by mouth daily.    cyclobenzaprine 10 MG tablet Commonly known as: FLEXERIL Take 10 mg by mouth 3 (three) times daily as needed for muscle spasms.    Dulera 100-5 MCG/ACT Aero Generic drug: mometasone-formoterol Inhale 2 puffs into the lungs 2 (two) times daily.    estradiol 0.1 MG/GM vaginal cream Commonly known as: ESTRACE Place 1 Applicatorful vaginally every Monday, Wednesday, and Friday.    famotidine 20 MG tablet Commonly known as: PEPCID Take 1 tablet (20 mg total) by mouth at bedtime.    fluvoxaMINE 100 MG tablet Commonly known as: LUVOX Take 150 mg by mouth 2 (two) times daily.    furosemide 20 MG tablet Commonly known as:  LASIX Take 40 mg by mouth daily.    gabapentin 400 MG capsule Commonly known as: NEURONTIN Take 400 mg  by mouth 3 (three) times daily.    GREEN TEA PO Take 1 tablet by mouth daily. MEGA-T Green Tea FAT BURNER    hydrOXYzine 50 MG tablet Commonly known as: ATARAX Take 50-100 mg by mouth at bedtime as needed (sleep).    lamoTRIgine 200 MG tablet Commonly known as: LAMICTAL Take 200 mg by mouth every evening.    lubiprostone 24 MCG capsule Commonly known as: AMITIZA TAKE 1 CAPSULE (24 MCG TOTAL) BY MOUTH 2 (TWO) TIMES DAILY WITH A MEAL.    metFORMIN 500 MG tablet Commonly known as: GLUCOPHAGE Take 500 mg by mouth 2 (two) times daily.    montelukast 10 MG tablet Commonly known as: SINGULAIR Take 10 mg by mouth at bedtime.    multivitamin tablet Take 1 tablet by mouth daily.    Myrbetriq 50 MG Tb24 tablet Generic drug: mirabegron ER Take 50 mg by mouth daily.    Omega 3 1200 MG Caps Take 1,200 mg by mouth daily.    oxymetazoline 0.05 % nasal spray Commonly known as: AFRIN Place 1 spray into both nostrils 2 (two) times daily as needed for congestion.    pantoprazole 40 MG tablet Commonly known as: Protonix Take 1 tablet (40 mg total) by mouth 2 (two) times daily before a meal.    polyethylene glycol powder 17 GM/SCOOP powder Commonly known as: GLYCOLAX/MIRALAX Take 17 g by mouth daily as needed for moderate constipation. For constipation    senna-docusate 8.6-50 MG tablet Commonly known as: Senokot-S Take 2 tablets by mouth daily.    SINUS + HEADACHE PO Take 2 tablets by mouth daily as needed (sinus headaches).    SUMAtriptan 100 MG tablet Commonly known as: IMITREX Take 100 mg by mouth every 2 (two) hours as needed for migraine. *TAKE ONE TABLET BY MOUTH AT ONSET OF MIGRAINE. IF MIGRAINE PERSISTS AFTER 2 HOURS, TAKE ONE ADDITIONAL TABLET. *DO NOT EXCEED MORE THAN 2 TABLETS IN A 24 HOUR PERIOD AND DO NOT USE NO MORE THAN 5 TABLETS WITHIN THE PERIOD OF 1 WEEK*     topiramate 50 MG tablet Commonly known as: TOPAMAX Take 150 mg by mouth daily.    traZODone 50 MG tablet Commonly known as: DESYREL Take 50-150 mg by mouth at bedtime.    UNABLE TO FIND Apply topically as needed. Med Name: **HOME TENS UNIT: FOR LOWER CHRONIC BACK PAIN/BACK PROBLEMS    valACYclovir 500 MG tablet Commonly known as: VALTREX Take 500 mg by mouth 2 (two) times daily as needed (outbreaks).    vitamin C 1000 MG tablet Take 1,000 mg by mouth daily.               ROS:  A comprehensive review of systems was negative except for: Respiratory: positive for asthma ,COPD, wheezing, SOB Gastrointestinal: positive for abdominal pain, constipation, nausea, and reflux symptoms Genitourinary: positive for frequency Musculoskeletal: positive for back pain, neck pain, and joint pain Neurological: positive for dizziness, headaches, and neuropathy Endocrine: positive for cold intolerance, tired, excessive thirst    Blood pressure 131/86, pulse 87, temperature 97.8 F (36.6 C), temperature source Oral, resp. rate 14, height '5\' 4"'$  (1.626 m), weight 192 lb (87.1 kg), SpO2 98 %. Physical Exam Vitals reviewed.  HENT:     Head: Normocephalic.     Nose: Nose normal.  Eyes:     Extraocular Movements: Extraocular movements intact.  Cardiovascular:     Rate and Rhythm: Normal rate and regular rhythm.  Pulmonary:     Effort: Pulmonary  effort is normal.     Breath sounds: Normal breath sounds.  Abdominal:     General: There is no distension.     Palpations: Abdomen is soft.     Tenderness: There is no abdominal tenderness.  Musculoskeletal:        General: Normal range of motion.     Cervical back: Normal range of motion.  Skin:    General: Skin is warm.  Neurological:     General: No focal deficit present.     Mental Status: She is alert and oriented to person, place, and time.  Psychiatric:        Mood and Affect: Mood normal.        Behavior: Behavior normal.         Thought Content: Thought content normal.        Judgment: Judgment normal.        Results: None   Assessment & Plan:  KASHENA NOVITSKI is a 51 y.o. female with multiple polyps  that want to pursue colectomy. She also has severe constipation only having Bms weekly or less.    Discussed open colectomy given how much of the colon we are taking and risk of bleeding, infection, anastomotic leak, need for bowel prep mechanically and antibiotic prep, risk of injury to ureters. We discussed post operative course and that she will have loose Bms 6+ times a day with total colectomy and will need imodium for the rest of her life with the colectomy. She wants to proceed.    Colon Preparation Get from the pharmacy- Golytely, flagyl and neomycin.    Two Days Prior to Surgery:  Take in Jug of Golytely throughout the day.    The Day Prior to Surgery: Only take in Clear liquids. Take in the second Jug of Golytely starting at 7AM and finishing by  2pm.  Drink plenty of clear liquids all day to avoid dehydration, no solid food.    Take 2 neomycin '500mg'$  tablets and 2 metronidazole '500mg'$  tablets at 2 pm. Take 2 neomycin '500mg'$  tablets and 2 metronidazole '500mg'$  tablets at 3pm. Take 2 neomycin '500mg'$  tablets and 2 metronidazole '500mg'$  tablets at 10pm.    Do not eat or drink anything after midnight the night before your surgery.  Do not eat or drink anything that morning, and take medications as instructed by the hospital staff on your preoperative visit.    All questions were answered to the satisfaction of the patient.     Virl Cagey 03/23/2022, 2:27 PM

## 2022-04-06 NOTE — Patient Instructions (Signed)
Alexis Harris  04/06/2022     '@[email or web address]'$ @   Your procedure is scheduled on  04/10/2022.   Report to [facility] at  0600  A.M.   Call this number if you have problems the morning of surgery:  [phone]  If you experience any cold or flu symptoms such as cough, fever, chills, shortness of breath, etc. between now and your scheduled surgery, please notify us at the above number.   Remember:      Two days before your procedure(Saturday 04/08/2022), drink your golytely jug throughout the day.      The day before your surgery(Sunday 04/09/2022), drink only clear liquids.      Take the second jug of golytely starting at 0700 AM and finishing it by 2 PM.      Drink plenty of clear liquids all day to avoid dehydration. No solid food.       Take 2 neomycin 500 mg tablets and 2 metronidazole tablets at 2 pm, again at 3 pm and again at 10 pm.     At 10 pm, drink 2 bottles of G2 gatorade, 12 oz.     You may drink clear liquids until  0330 am on 04/10/2022.          Clear liquids allowed are:                    Water, Juice (No red color; non-citric and without pulp; diabetics please choose diet or no sugar options), Carbonated beverages (diabetics please choose diet or no sugar options), Clear Tea (No creamer, milk, or cream, including half & half and powdered creamer), Black Coffee Only (No creamer, milk or cream, including half & half and powdered creamer), Plain Jell-O Only (No red color; diabetics please choose no sugar options), Clear Sports drink (No red color; diabetics please choose diet or no sugar options), and Plain Popsicles Only (No red color; diabetics please choose no sugar options)               At 0330 am on 04/10/2022, drink one more 12 oz G2.         You can have nothing else to drink after this.    Take these medicines the morning of surgery with A SIP OF WATER           abilify, wellbutrin, buspar, zyrtec, flexeril(if needed), luvox, gabapentin,  protonix, imitrex(if needed), topamx.         Use your inhalers before you come and bring your rescue inhaler with you.    Do not wear jewelry, make-up or nail polish.  Do not wear lotions, powders, or perfumes, or deodorant.  Do not shave 48 hours prior to surgery.  Men may shave face and neck.  Do not bring valuables to the hospital.  [facility] is not responsible for any belongings or valuables.  Contacts, dentures or bridgework may not be worn into surgery.  Leave your suitcase in the car.  After surgery it may be brought to your room.  For patients admitted to the hospital, discharge time will be determined by your treatment team.  Patients discharged the day of surgery will not be allowed to drive home.    Special instructions:   DO NOT smoke tobacco or vape for 24 hours before your procedure.  Please read over the following fact sheets that you were given. Pain Booklet, Coughing and Deep Breathing, Blood Transfusion  Information, Lab Information, Surgical Site Infection Prevention, Anesthesia Post-op Instructions, and Care and Recovery After Surgery      Open Total Colectomy, Adult, Care After The following information offers guidance on how to care for yourself after your procedure. Your health care provider may also give you more specific instructions. If you have problems or questions, contact your health care provider. What can I expect after the surgery? After the procedure, it is common to have: Pain, bruising, or swelling in your abdomen, especially in the incision areas. You will be given medicine to control the pain. Tiredness. Your energy level will return to normal over the next several weeks. Changes to your bowel movements, such as constipation or having bowel movements more often. Bloating. Nausea. Trouble urinating. Follow these instructions at home: Medicines Take over-the-counter and prescription medicines only as told by your health care provider. Ask  your health care provider if the medicine prescribed to you: Requires you to avoid driving or using machinery. Can cause constipation. You may need to take these actions to prevent or treat constipation: Drink enough fluids to keep your urine pale yellow. Take over-the-counter or prescription medicines, including stool softeners. Limit foods that are high in fat and processed sugars, such as fried or sweet foods. Eating and drinking Follow instructions from your health care provider about eating or drinking restrictions. Do not drink alcohol if your health care provider tells you not to drink. Eat a low-fiber diet for the first 4 weeks after surgery. Most people on a low-fiber eating plan should eat less than 10 grams (g) of fiber per day. Follow recommendations from your health care provider or dietitian about how much fiber you should have each day. Always check food labels to know the fiber content of packaged foods. In general, a low-fiber food will have fewer than 2 g of fiber per serving. In general, try to avoid whole grains, raw fruits and vegetables, dried fruit, tough cuts of meat, nuts, and seeds. Incision care  Follow instructions from your health care provider about how to take care of your incision. Make sure you: Wash your hands with soap and water for at least 20 seconds before and after you change your bandage (dressing). If soap and water are not available, use hand sanitizer. Change your dressing as told by your health care provider. Leave stitches (sutures), staples, or adhesive strips in place. These skin closures may need to stay in place for 2 weeks or longer. If adhesive strip edges start to loosen and curl up, you may trim the loose edges. Do not remove adhesive strips completely unless your health care provider tells you to do that. Check your incision area every day for signs of infection. Check for: More redness, swelling, or pain. Fluid or blood. Warmth. Pus or a  bad smell. Keep your incision clean and dry. Avoid wearing tight clothing around your incision. Do not take baths, swim, or use a hot tub until your health care provider approves. Ask your health care provider if you may take showers. You may only be allowed to take sponge baths. Activity  Rest as told by your health care provider. Do not sit for a long time without moving. Get up to take short walks every 1-2 hours. This will improve blood flow and breathing. Ask for help if you feel weak or unsteady. You may have to avoid lifting. Ask your health care provider how much you can safely lift. Do deep breathing exercises as told by your  health care provider. Place your hands or a pillow over your incision area before you cough. Return to your normal activities as told by your health care provider. Ask your health care provider what activities are safe for you. General instructions If you have an opening (stoma) in your abdomen, follow instructions from your health care provider about how to care for the stoma and the external pouch attached to it (ostomy pouch). Do not use any products that contain nicotine or tobacco. These products include cigarettes, chewing tobacco, and vaping devices, such as e-cigarettes. If you need help quitting, ask your health care provider. Wear compression stockings as told by your health care provider. These stockings help prevent blood clots and reduce swelling in your legs. Keep all follow-up visits. This is important to monitor healing and check for any complications. Contact a health care provider if: You have not had a bowel movement within 3 days after surgery. You have chills or fever. Medicine is not controlling your pain. You have any signs of infection at your incision area. You have nausea and vomiting that will not go away. Get help right away if: You have chest pain or trouble breathing. You have a warm, tender swelling in your leg. Your incision  breaks open. You have severe pain. You have bleeding from the rectum. These symptoms may be an emergency. Get help right away. Call 911. Do not wait to see if the symptoms go away. Do not drive yourself to the hospital. Summary After your procedure, it is common to have pain, tiredness, constipation, nausea, and trouble urinating. If you have an opening (stoma) in your abdomen, follow instructions from your health care provider about how to care for the stoma and the external pouch attached to it (ostomy pouch). Follow instructions from your health care provider about how to take care of your incision. Get help right away if you have chest pain or trouble breathing. This information is not intended to replace advice given to you by your health care provider. Make sure you discuss any questions you have with your health care provider. Document Revised: 07/06/2021 Document Reviewed: 07/06/2021 Elsevier Patient Education  [place] Anesthesia, Adult, Care After The following information offers guidance on how to care for yourself after your procedure. Your health care provider may also give you more specific instructions. If you have problems or questions, contact your health care provider. What can I expect after the procedure? After the procedure, it is common for people to: Have pain or discomfort at the IV site. Have nausea or vomiting. Have a sore throat or hoarseness. Have trouble concentrating. Feel cold or chills. Feel weak, sleepy, or tired (fatigue). Have soreness and body aches. These can affect parts of the body that were not involved in surgery. Follow these instructions at home: For the time period you were told by your health care provider:  Rest. Do not participate in activities where you could fall or become injured. Do not drive or use machinery. Do not drink alcohol. Do not take sleeping pills or medicines that cause drowsiness. Do not make important  decisions or sign legal documents. Do not take care of children on your own. General instructions Drink enough fluid to keep your urine pale yellow. If you have sleep apnea, surgery and certain medicines can increase your risk for breathing problems. Follow instructions from your health care provider about wearing your sleep device: Anytime you are sleeping, including during daytime naps. While taking prescription pain  medicines, sleeping medicines, or medicines that make you drowsy. Return to your normal activities as told by your health care provider. Ask your health care provider what activities are safe for you. Take over-the-counter and prescription medicines only as told by your health care provider. Do not use any products that contain nicotine or tobacco. These products include cigarettes, chewing tobacco, and vaping devices, such as e-cigarettes. These can delay incision healing after surgery. If you need help quitting, ask your health care provider. Contact a health care provider if: You have nausea or vomiting that does not get better with medicine. You vomit every time you eat or drink. You have pain that does not get better with medicine. You cannot urinate or have bloody urine. You develop a skin rash. You have a fever. Get help right away if: You have trouble breathing. You have chest pain. You vomit blood. These symptoms may be an emergency. Get help right away. Call 911. Do not wait to see if the symptoms will go away. Do not drive yourself to the hospital. Summary After the procedure, it is common to have a sore throat, hoarseness, nausea, vomiting, or to feel weak, sleepy, or fatigue. For the time period you were told by your health care provider, do not drive or use machinery. Get help right away if you have difficulty breathing, have chest pain, or vomit blood. These symptoms may be an emergency. This information is not intended to replace advice given to you by your  health care provider. Make sure you discuss any questions you have with your health care provider. Document Revised: 06/17/2021 Document Reviewed: 06/17/2021 Elsevier Patient Education  [place]. How to Use Chlorhexidine Before Surgery Chlorhexidine gluconate (CHG) is a germ-killing (antiseptic) solution that is used to clean the skin. It can get rid of the bacteria that normally live on the skin and can keep them away for about 24 hours. To clean your skin with CHG, you may be given: A CHG solution to use in the shower or as part of a sponge bath. A prepackaged cloth that contains CHG. Cleaning your skin with CHG may help lower the risk for infection: While you are staying in the intensive care unit of the hospital. If you have a vascular access, such as a central line, to provide short-term or long-term access to your veins. If you have a catheter to drain urine from your bladder. If you are on a ventilator. A ventilator is a machine that helps you breathe by moving air in and out of your lungs. After surgery. What are the risks? Risks of using CHG include: A skin reaction. Hearing loss, if CHG gets in your ears and you have a perforated eardrum. Eye injury, if CHG gets in your eyes and is not rinsed out. The CHG product catching fire. Make sure that you avoid smoking and flames after applying CHG to your skin. Do not use CHG: If you have a chlorhexidine allergy or have previously reacted to chlorhexidine. On babies younger than 76 months of age. How to use CHG solution Use CHG only as told by your health care provider, and follow the instructions on the label. Use the full amount of CHG as directed. Usually, this is one bottle. During a shower Follow these steps when using CHG solution during a shower (unless your health care provider gives you different instructions): Start the shower. Use your normal soap and shampoo to wash your face and hair. Turn off the shower or  move  out of the shower stream. Pour the CHG onto a clean washcloth. Do not use any type of brush or rough-edged sponge. Starting at your neck, lather your body down to your toes. Make sure you follow these instructions: If you will be having surgery, pay special attention to the part of your body where you will be having surgery. Scrub this area for at least 1 minute. Do not use CHG on your head or face. If the solution gets into your ears or eyes, rinse them well with water. Avoid your genital area. Avoid any areas of skin that have broken skin, cuts, or scrapes. Scrub your back and under your arms. Make sure to wash skin folds. Let the lather sit on your skin for 1-2 minutes or as long as told by your health care provider. Thoroughly rinse your entire body in the shower. Make sure that all body creases and crevices are rinsed well. Dry off with a clean towel. Do not put any substances on your body afterward--such as powder, lotion, or perfume--unless you are told to do so by your health care provider. Only use lotions that are recommended by the manufacturer. Put on clean clothes or pajamas. If it is the night before your surgery, sleep in clean sheets.  During a sponge bath Follow these steps when using CHG solution during a sponge bath (unless your health care provider gives you different instructions): Use your normal soap and shampoo to wash your face and hair. Pour the CHG onto a clean washcloth. Starting at your neck, lather your body down to your toes. Make sure you follow these instructions: If you will be having surgery, pay special attention to the part of your body where you will be having surgery. Scrub this area for at least 1 minute. Do not use CHG on your head or face. If the solution gets into your ears or eyes, rinse them well with water. Avoid your genital area. Avoid any areas of skin that have broken skin, cuts, or scrapes. Scrub your back and under your arms. Make sure to  wash skin folds. Let the lather sit on your skin for 1-2 minutes or as long as told by your health care provider. Using a different clean, wet washcloth, thoroughly rinse your entire body. Make sure that all body creases and crevices are rinsed well. Dry off with a clean towel. Do not put any substances on your body afterward--such as powder, lotion, or perfume--unless you are told to do so by your health care provider. Only use lotions that are recommended by the manufacturer. Put on clean clothes or pajamas. If it is the night before your surgery, sleep in clean sheets. How to use CHG prepackaged cloths Only use CHG cloths as told by your health care provider, and follow the instructions on the label. Use the CHG cloth on clean, dry skin. Do not use the CHG cloth on your head or face unless your health care provider tells you to. When washing with the CHG cloth: Avoid your genital area. Avoid any areas of skin that have broken skin, cuts, or scrapes. Before surgery Follow these steps when using a CHG cloth to clean before surgery (unless your health care provider gives you different instructions): Using the CHG cloth, vigorously scrub the part of your body where you will be having surgery. Scrub using a back-and-forth motion for 3 minutes. The area on your body should be completely wet with CHG when you are done scrubbing. Do  not rinse. Discard the cloth and let the area air-dry. Do not put any substances on the area afterward, such as powder, lotion, or perfume. Put on clean clothes or pajamas. If it is the night before your surgery, sleep in clean sheets.  For general bathing Follow these steps when using CHG cloths for general bathing (unless your health care provider gives you different instructions). Use a separate CHG cloth for each area of your body. Make sure you wash between any folds of skin and between your fingers and toes. Wash your body in the following order, switching to a new  cloth after each step: The front of your neck, shoulders, and chest. Both of your arms, under your arms, and your hands. Your stomach and groin area, avoiding the genitals. Your right leg and foot. Your left leg and foot. The back of your neck, your back, and your buttocks. Do not rinse. Discard the cloth and let the area air-dry. Do not put any substances on your body afterward--such as powder, lotion, or perfume--unless you are told to do so by your health care provider. Only use lotions that are recommended by the manufacturer. Put on clean clothes or pajamas. Contact a health care provider if: Your skin gets irritated after scrubbing. You have questions about using your solution or cloth. You swallow any chlorhexidine. Call your local poison control center (1-(786) 556-5714 in the [place] Get help right away if: Your eyes itch badly, or they become very red or swollen. Your skin itches badly and is red or swollen. Your hearing changes. You have trouble seeing. You have swelling or tingling in your mouth or throat. You have trouble breathing. These symptoms may represent a serious problem that is an emergency. Do not wait to see if the symptoms will go away. Get medical help right away. Call your local emergency services (911 in the [place] Do not drive yourself to the hospital. Summary Chlorhexidine gluconate (CHG) is a germ-killing (antiseptic) solution that is used to clean the skin. Cleaning your skin with CHG may help to lower your risk for infection. You may be given CHG to use for bathing. It may be in a bottle or in a prepackaged cloth to use on your skin. Carefully follow your health care provider's instructions and the instructions on the product label. Do not use CHG if you have a chlorhexidine allergy. Contact your health care provider if your skin gets irritated after scrubbing. This information is not intended to replace advice given to you by your health care provider. Make  sure you discuss any questions you have with your health care provider. Document Revised: 07/18/2021 Document Reviewed: 05/31/2020 Elsevier Patient Education  [place].

## 2022-04-07 ENCOUNTER — Encounter (HOSPITAL_COMMUNITY): Payer: Medicare Other

## 2022-04-07 ENCOUNTER — Other Ambulatory Visit (HOSPITAL_COMMUNITY)
Admission: RE | Admit: 2022-04-07 | Discharge: 2022-04-07 | Disposition: A | Discharge status: Home / Self Care | Payer: 59 | Source: Ambulatory Visit | Attending: General Surgery | Admitting: General Surgery

## 2022-04-07 VITALS — BP 134/90 | HR 96 | Temp 97.6°F | Resp 18 | Ht 64.0 in | Wt 192.0 lb

## 2022-04-07 DIAGNOSIS — F17200 Nicotine dependence, unspecified, uncomplicated: Secondary | ICD-10-CM | POA: Insufficient documentation

## 2022-04-07 DIAGNOSIS — J4489 Other specified chronic obstructive pulmonary disease: Secondary | ICD-10-CM | POA: Diagnosis not present

## 2022-04-07 DIAGNOSIS — Z885 Allergy status to narcotic agent status: Secondary | ICD-10-CM | POA: Diagnosis not present

## 2022-04-07 DIAGNOSIS — Z888 Allergy status to other drugs, medicaments and biological substances status: Secondary | ICD-10-CM | POA: Diagnosis not present

## 2022-04-07 DIAGNOSIS — Z961 Presence of intraocular lens: Secondary | ICD-10-CM | POA: Diagnosis not present

## 2022-04-07 DIAGNOSIS — M797 Fibromyalgia: Secondary | ICD-10-CM | POA: Diagnosis not present

## 2022-04-07 DIAGNOSIS — D126 Benign neoplasm of colon, unspecified: Secondary | ICD-10-CM | POA: Diagnosis not present

## 2022-04-07 DIAGNOSIS — K5900 Constipation, unspecified: Secondary | ICD-10-CM | POA: Diagnosis not present

## 2022-04-07 DIAGNOSIS — Z79899 Other long term (current) drug therapy: Secondary | ICD-10-CM | POA: Insufficient documentation

## 2022-04-07 DIAGNOSIS — Q438 Other specified congenital malformations of intestine: Secondary | ICD-10-CM | POA: Diagnosis not present

## 2022-04-07 DIAGNOSIS — Z9842 Cataract extraction status, left eye: Secondary | ICD-10-CM | POA: Diagnosis not present

## 2022-04-07 DIAGNOSIS — D369 Benign neoplasm, unspecified site: Secondary | ICD-10-CM

## 2022-04-07 DIAGNOSIS — G4730 Sleep apnea, unspecified: Secondary | ICD-10-CM | POA: Diagnosis not present

## 2022-04-07 DIAGNOSIS — G43909 Migraine, unspecified, not intractable, without status migrainosus: Secondary | ICD-10-CM | POA: Diagnosis not present

## 2022-04-07 DIAGNOSIS — G8929 Other chronic pain: Secondary | ICD-10-CM | POA: Diagnosis not present

## 2022-04-07 DIAGNOSIS — Z01818 Encounter for other preprocedural examination: Secondary | ICD-10-CM | POA: Insufficient documentation

## 2022-04-07 DIAGNOSIS — Z880 Allergy status to penicillin: Secondary | ICD-10-CM | POA: Diagnosis not present

## 2022-04-07 DIAGNOSIS — R9431 Abnormal electrocardiogram [ECG] [EKG]: Secondary | ICD-10-CM | POA: Insufficient documentation

## 2022-04-07 DIAGNOSIS — M5030 Other cervical disc degeneration, unspecified cervical region: Secondary | ICD-10-CM | POA: Diagnosis not present

## 2022-04-07 DIAGNOSIS — F17210 Nicotine dependence, cigarettes, uncomplicated: Secondary | ICD-10-CM | POA: Diagnosis not present

## 2022-04-07 DIAGNOSIS — G2581 Restless legs syndrome: Secondary | ICD-10-CM | POA: Diagnosis not present

## 2022-04-07 DIAGNOSIS — Z9841 Cataract extraction status, right eye: Secondary | ICD-10-CM | POA: Diagnosis not present

## 2022-04-07 DIAGNOSIS — K589 Irritable bowel syndrome without diarrhea: Secondary | ICD-10-CM | POA: Diagnosis not present

## 2022-04-07 DIAGNOSIS — E119 Type 2 diabetes mellitus without complications: Secondary | ICD-10-CM | POA: Diagnosis not present

## 2022-04-07 DIAGNOSIS — K219 Gastro-esophageal reflux disease without esophagitis: Secondary | ICD-10-CM | POA: Diagnosis not present

## 2022-04-07 DIAGNOSIS — E876 Hypokalemia: Secondary | ICD-10-CM | POA: Diagnosis not present

## 2022-04-07 LAB — CBC WITH DIFFERENTIAL/PLATELET
Abs Immature Granulocytes: 0.02 10*3/uL (ref 0.00–0.07)
Basophils Absolute: 0 10*3/uL (ref 0.0–0.1)
Basophils Relative: 1 %
Eosinophils Absolute: 0.2 10*3/uL (ref 0.0–0.5)
Eosinophils Relative: 3 %
HCT: 41.8 % (ref 36.0–46.0)
Hemoglobin: 13.7 g/dL (ref 12.0–15.0)
Immature Granulocytes: 0 %
Lymphocytes Relative: 31 %
Lymphs Abs: 2 10*3/uL (ref 0.7–4.0)
MCH: 30.9 pg (ref 26.0–34.0)
MCHC: 32.8 g/dL (ref 30.0–36.0)
MCV: 94.4 fL (ref 80.0–100.0)
Monocytes Absolute: 0.5 10*3/uL (ref 0.1–1.0)
Monocytes Relative: 8 %
Neutro Abs: 3.8 10*3/uL (ref 1.7–7.7)
Neutrophils Relative %: 57 %
Platelets: 283 10*3/uL (ref 150–400)
RBC: 4.43 MIL/uL (ref 3.87–5.11)
RDW: 14.5 % (ref 11.5–15.5)
WBC: 6.5 10*3/uL (ref 4.0–10.5)
nRBC: 0 % (ref 0.0–0.2)

## 2022-04-07 LAB — BASIC METABOLIC PANEL
Anion gap: 10 (ref 5–15)
BUN: 20 mg/dL (ref 6–20)
CO2: 25 mmol/L (ref 22–32)
Calcium: 9.5 mg/dL (ref 8.9–10.3)
Chloride: 106 mmol/L (ref 98–111)
Creatinine, Ser: 0.73 mg/dL (ref 0.44–1.00)
GFR, Estimated: >60 mL/min (ref >60)
Glucose, Bld: 75 mg/dL (ref 70–99)
Potassium: 3.9 mmol/L (ref 3.5–5.1)
Sodium: 141 mmol/L (ref 135–145)

## 2022-04-07 LAB — TYPE AND SCREEN
ABO/RH(D): O POS
Antibody Screen: NEGATIVE

## 2022-04-08 LAB — HEMOGLOBIN A1C
Hgb A1c MFr Bld: 6.6 % — ABNORMAL HIGH (ref 4.8–5.6)
Mean Plasma Glucose: 143 mg/dL

## 2022-04-10 ENCOUNTER — Inpatient Hospital Stay (HOSPITAL_COMMUNITY)
Admission: RE | Admit: 2022-04-10 | Discharge: 2022-04-13 | DRG: 330 | Disposition: A | Discharge status: Home / Self Care | Payer: 59 | Attending: General Surgery | Admitting: General Surgery

## 2022-04-10 ENCOUNTER — Inpatient Hospital Stay (HOSPITAL_COMMUNITY): Payer: 59 | Admitting: Certified Registered"

## 2022-04-10 ENCOUNTER — Other Ambulatory Visit: Payer: Self-pay

## 2022-04-10 ENCOUNTER — Encounter (HOSPITAL_COMMUNITY): Payer: Self-pay | Admitting: General Surgery

## 2022-04-10 ENCOUNTER — Encounter (HOSPITAL_COMMUNITY)
Admission: RE | Disposition: A | Discharge status: Home / Self Care | Payer: Self-pay | Source: Ambulatory Visit | Attending: General Surgery

## 2022-04-10 DIAGNOSIS — E119 Type 2 diabetes mellitus without complications: Secondary | ICD-10-CM

## 2022-04-10 DIAGNOSIS — D126 Benign neoplasm of colon, unspecified: Secondary | ICD-10-CM | POA: Diagnosis not present

## 2022-04-10 DIAGNOSIS — Z91048 Other nonmedicinal substance allergy status: Secondary | ICD-10-CM

## 2022-04-10 DIAGNOSIS — Z961 Presence of intraocular lens: Secondary | ICD-10-CM | POA: Diagnosis present

## 2022-04-10 DIAGNOSIS — Z9842 Cataract extraction status, left eye: Secondary | ICD-10-CM | POA: Diagnosis not present

## 2022-04-10 DIAGNOSIS — G2581 Restless legs syndrome: Secondary | ICD-10-CM | POA: Diagnosis not present

## 2022-04-10 DIAGNOSIS — Z885 Allergy status to narcotic agent status: Secondary | ICD-10-CM

## 2022-04-10 DIAGNOSIS — Z90710 Acquired absence of both cervix and uterus: Secondary | ICD-10-CM

## 2022-04-10 DIAGNOSIS — Z880 Allergy status to penicillin: Secondary | ICD-10-CM | POA: Diagnosis not present

## 2022-04-10 DIAGNOSIS — M5030 Other cervical disc degeneration, unspecified cervical region: Secondary | ICD-10-CM | POA: Diagnosis present

## 2022-04-10 DIAGNOSIS — F418 Other specified anxiety disorders: Secondary | ICD-10-CM

## 2022-04-10 DIAGNOSIS — E876 Hypokalemia: Secondary | ICD-10-CM | POA: Diagnosis not present

## 2022-04-10 DIAGNOSIS — M797 Fibromyalgia: Secondary | ICD-10-CM | POA: Diagnosis not present

## 2022-04-10 DIAGNOSIS — Q438 Other specified congenital malformations of intestine: Secondary | ICD-10-CM | POA: Diagnosis not present

## 2022-04-10 DIAGNOSIS — Z79899 Other long term (current) drug therapy: Secondary | ICD-10-CM

## 2022-04-10 DIAGNOSIS — K219 Gastro-esophageal reflux disease without esophagitis: Secondary | ICD-10-CM | POA: Diagnosis present

## 2022-04-10 DIAGNOSIS — K5900 Constipation, unspecified: Secondary | ICD-10-CM | POA: Diagnosis not present

## 2022-04-10 DIAGNOSIS — F429 Obsessive-compulsive disorder, unspecified: Secondary | ICD-10-CM | POA: Diagnosis not present

## 2022-04-10 DIAGNOSIS — K589 Irritable bowel syndrome without diarrhea: Secondary | ICD-10-CM | POA: Diagnosis not present

## 2022-04-10 DIAGNOSIS — D369 Benign neoplasm, unspecified site: Secondary | ICD-10-CM | POA: Diagnosis present

## 2022-04-10 DIAGNOSIS — Z9841 Cataract extraction status, right eye: Secondary | ICD-10-CM | POA: Diagnosis not present

## 2022-04-10 DIAGNOSIS — Z888 Allergy status to other drugs, medicaments and biological substances status: Secondary | ICD-10-CM

## 2022-04-10 DIAGNOSIS — G43909 Migraine, unspecified, not intractable, without status migrainosus: Secondary | ICD-10-CM | POA: Diagnosis not present

## 2022-04-10 DIAGNOSIS — G4730 Sleep apnea, unspecified: Secondary | ICD-10-CM | POA: Diagnosis present

## 2022-04-10 DIAGNOSIS — F17210 Nicotine dependence, cigarettes, uncomplicated: Secondary | ICD-10-CM | POA: Diagnosis present

## 2022-04-10 DIAGNOSIS — J4489 Other specified chronic obstructive pulmonary disease: Secondary | ICD-10-CM | POA: Diagnosis not present

## 2022-04-10 DIAGNOSIS — Z7984 Long term (current) use of oral hypoglycemic drugs: Secondary | ICD-10-CM | POA: Diagnosis not present

## 2022-04-10 DIAGNOSIS — F419 Anxiety disorder, unspecified: Secondary | ICD-10-CM | POA: Diagnosis present

## 2022-04-10 DIAGNOSIS — Z01818 Encounter for other preprocedural examination: Secondary | ICD-10-CM

## 2022-04-10 DIAGNOSIS — G8929 Other chronic pain: Secondary | ICD-10-CM | POA: Diagnosis not present

## 2022-04-10 DIAGNOSIS — J45909 Unspecified asthma, uncomplicated: Secondary | ICD-10-CM | POA: Diagnosis not present

## 2022-04-10 DIAGNOSIS — F17200 Nicotine dependence, unspecified, uncomplicated: Secondary | ICD-10-CM

## 2022-04-10 DIAGNOSIS — D123 Benign neoplasm of transverse colon: Secondary | ICD-10-CM | POA: Diagnosis not present

## 2022-04-10 HISTORY — PX: PARTIAL COLECTOMY: SHX5273

## 2022-04-10 LAB — GLUCOSE, CAPILLARY: Glucose-Capillary: 140 mg/dL — ABNORMAL HIGH (ref 70–99)

## 2022-04-10 SURGERY — COLECTOMY, PARTIAL
Anesthesia: General | Site: Abdomen

## 2022-04-10 MED ORDER — ONDANSETRON HCL 4 MG PO TABS: 4.0000 mg | ORAL_TABLET | Freq: Four times a day (QID) | ORAL | Status: DC | PRN

## 2022-04-10 MED ORDER — ARIPIPRAZOLE 10 MG PO TABS
10.0000 mg | ORAL_TABLET | Freq: Every day | ORAL | Status: DC
  Administered 2022-04-11 – 2022-04-13 (×3): 10 mg via ORAL
  Filled 2022-04-10 (×3): qty 1

## 2022-04-10 MED ORDER — LAMOTRIGINE 100 MG PO TABS
200.0000 mg | ORAL_TABLET | Freq: Every evening | ORAL | Status: DC
  Administered 2022-04-10 – 2022-04-12 (×3): 200 mg via ORAL
  Filled 2022-04-10 (×3): qty 2

## 2022-04-10 MED ORDER — DEXAMETHASONE SODIUM PHOSPHATE 10 MG/ML IJ SOLN
INTRAMUSCULAR | Status: DC | PRN
  Administered 2022-04-10: 10 mg via INTRAVENOUS

## 2022-04-10 MED ORDER — CHLORHEXIDINE GLUCONATE CLOTH 2 % EX PADS: 6.0000 | MEDICATED_PAD | Freq: Once | CUTANEOUS | Status: DC

## 2022-04-10 MED ORDER — TOPIRAMATE 25 MG PO TABS
150.0000 mg | ORAL_TABLET | Freq: Every day | ORAL | Status: DC
  Administered 2022-04-11 – 2022-04-13 (×3): 150 mg via ORAL
  Filled 2022-04-10 (×3): qty 2

## 2022-04-10 MED ORDER — ATORVASTATIN CALCIUM 20 MG PO TABS
20.0000 mg | ORAL_TABLET | Freq: Every day | ORAL | Status: DC
  Administered 2022-04-10 – 2022-04-12 (×3): 20 mg via ORAL
  Filled 2022-04-10 (×3): qty 1

## 2022-04-10 MED ORDER — SEVOFLURANE IN SOLN
RESPIRATORY_TRACT | Status: AC
  Filled 2022-04-10: qty 250

## 2022-04-10 MED ORDER — SUGAMMADEX SODIUM 200 MG/2ML IV SOLN
INTRAVENOUS | Status: DC | PRN
  Administered 2022-04-10: 200 mg via INTRAVENOUS

## 2022-04-10 MED ORDER — CHLORHEXIDINE GLUCONATE 0.12 % MT SOLN
15.0000 mL | Freq: Once | OROMUCOSAL | Status: AC
  Administered 2022-04-10: 15 mL via OROMUCOSAL

## 2022-04-10 MED ORDER — PROPOFOL 10 MG/ML IV BOLUS
INTRAVENOUS | Status: DC | PRN
  Administered 2022-04-10: 150 mg via INTRAVENOUS

## 2022-04-10 MED ORDER — 0.9 % SODIUM CHLORIDE (POUR BTL) OPTIME
TOPICAL | Status: DC | PRN
  Administered 2022-04-10 (×4): 1000 mL

## 2022-04-10 MED ORDER — TRAMADOL HCL 50 MG PO TABS
50.0000 mg | ORAL_TABLET | Freq: Four times a day (QID) | ORAL | Status: DC | PRN
  Administered 2022-04-10 – 2022-04-11 (×2): 50 mg via ORAL
  Filled 2022-04-10 (×2): qty 1

## 2022-04-10 MED ORDER — METOPROLOL TARTRATE 5 MG/5ML IV SOLN: 5.0000 mg | Freq: Four times a day (QID) | INTRAVENOUS | Status: DC | PRN

## 2022-04-10 MED ORDER — FENTANYL CITRATE PF 50 MCG/ML IJ SOSY
25.0000 ug | PREFILLED_SYRINGE | INTRAMUSCULAR | Status: DC | PRN
  Administered 2022-04-10 (×3): 50 ug via INTRAVENOUS
  Filled 2022-04-10 (×3): qty 1

## 2022-04-10 MED ORDER — GABAPENTIN 400 MG PO CAPS
400.0000 mg | ORAL_CAPSULE | Freq: Three times a day (TID) | ORAL | Status: DC
  Administered 2022-04-10 – 2022-04-13 (×9): 400 mg via ORAL
  Filled 2022-04-10 (×9): qty 1

## 2022-04-10 MED ORDER — SACCHAROMYCES BOULARDII 250 MG PO CAPS
250.0000 mg | ORAL_CAPSULE | Freq: Two times a day (BID) | ORAL | Status: DC
  Administered 2022-04-10 – 2022-04-13 (×7): 250 mg via ORAL
  Filled 2022-04-10 (×7): qty 1

## 2022-04-10 MED ORDER — ONDANSETRON HCL 4 MG/2ML IJ SOLN
INTRAMUSCULAR | Status: DC | PRN
  Administered 2022-04-10: 4 mg via INTRAVENOUS

## 2022-04-10 MED ORDER — FLUVOXAMINE MALEATE 50 MG PO TABS
150.0000 mg | ORAL_TABLET | Freq: Two times a day (BID) | ORAL | Status: DC
  Administered 2022-04-10 – 2022-04-13 (×6): 150 mg via ORAL
  Filled 2022-04-10 (×8): qty 3

## 2022-04-10 MED ORDER — LACTATED RINGERS IV SOLN: INTRAVENOUS | Status: DC

## 2022-04-10 MED ORDER — SODIUM CHLORIDE 0.9 % IV SOLN
1.0000 g | INTRAVENOUS | Status: AC
  Administered 2022-04-10: 1 g via INTRAVENOUS

## 2022-04-10 MED ORDER — BUPIVACAINE LIPOSOME 1.3 % IJ SUSP
INTRAMUSCULAR | Status: AC
  Filled 2022-04-10: qty 20

## 2022-04-10 MED ORDER — PHENYLEPHRINE 80 MCG/ML (10ML) SYRINGE FOR IV PUSH (FOR BLOOD PRESSURE SUPPORT)
PREFILLED_SYRINGE | INTRAVENOUS | Status: DC | PRN
  Administered 2022-04-10: 160 ug via INTRAVENOUS

## 2022-04-10 MED ORDER — ROCURONIUM BROMIDE 10 MG/ML (PF) SYRINGE
PREFILLED_SYRINGE | INTRAVENOUS | Status: DC | PRN
  Administered 2022-04-10: 20 mg via INTRAVENOUS
  Administered 2022-04-10: 60 mg via INTRAVENOUS
  Administered 2022-04-10: 20 mg via INTRAVENOUS

## 2022-04-10 MED ORDER — ADULT MULTIVITAMIN W/MINERALS CH
1.0000 | ORAL_TABLET | Freq: Every morning | ORAL | Status: DC
  Administered 2022-04-11 – 2022-04-13 (×3): 1 via ORAL
  Filled 2022-04-10 (×3): qty 1

## 2022-04-10 MED ORDER — SIMETHICONE 80 MG PO CHEW: 40.0000 mg | CHEWABLE_TABLET | Freq: Four times a day (QID) | ORAL | Status: DC | PRN

## 2022-04-10 MED ORDER — DIPHENHYDRAMINE HCL 50 MG/ML IJ SOLN: 12.5000 mg | Freq: Four times a day (QID) | INTRAMUSCULAR | Status: DC | PRN

## 2022-04-10 MED ORDER — ALBUTEROL SULFATE (2.5 MG/3ML) 0.083% IN NEBU: 2.5000 mg | INHALATION_SOLUTION | Freq: Four times a day (QID) | RESPIRATORY_TRACT | Status: DC | PRN

## 2022-04-10 MED ORDER — ENSURE PRE-SURGERY PO LIQD: 592.0000 mL | Freq: Once | ORAL | Status: DC

## 2022-04-10 MED ORDER — HYDROMORPHONE HCL 1 MG/ML IJ SOLN
0.5000 mg | INTRAMUSCULAR | Status: DC | PRN
  Administered 2022-04-10 – 2022-04-11 (×2): 0.5 mg via INTRAVENOUS
  Filled 2022-04-10 (×2): qty 0.5

## 2022-04-10 MED ORDER — ORAL CARE MOUTH RINSE: 15.0000 mL | Freq: Once | OROMUCOSAL | Status: AC

## 2022-04-10 MED ORDER — OXYCODONE HCL 5 MG PO TABS: 5.0000 mg | ORAL_TABLET | Freq: Once | ORAL | Status: DC | PRN

## 2022-04-10 MED ORDER — FLUVOXAMINE MALEATE 50 MG PO TABS
150.0000 mg | ORAL_TABLET | Freq: Two times a day (BID) | ORAL | Status: DC
  Filled 2022-04-10 (×4): qty 1

## 2022-04-10 MED ORDER — OXYMETAZOLINE HCL 0.05 % NA SOLN: 1.0000 | Freq: Two times a day (BID) | NASAL | Status: AC | PRN

## 2022-04-10 MED ORDER — MONTELUKAST SODIUM 10 MG PO TABS
10.0000 mg | ORAL_TABLET | Freq: Every day | ORAL | Status: DC
  Administered 2022-04-10 – 2022-04-12 (×3): 10 mg via ORAL
  Filled 2022-04-10 (×3): qty 1

## 2022-04-10 MED ORDER — SODIUM CHLORIDE 0.9 % IV SOLN
INTRAVENOUS | Status: AC
  Filled 2022-04-10: qty 1

## 2022-04-10 MED ORDER — FUROSEMIDE 20 MG PO TABS
20.0000 mg | ORAL_TABLET | Freq: Two times a day (BID) | ORAL | Status: DC
  Administered 2022-04-10 – 2022-04-13 (×6): 20 mg via ORAL
  Filled 2022-04-10 (×6): qty 1

## 2022-04-10 MED ORDER — CYCLOBENZAPRINE HCL 10 MG PO TABS: 10.0000 mg | ORAL_TABLET | Freq: Three times a day (TID) | ORAL | Status: DC | PRN

## 2022-04-10 MED ORDER — BUPROPION HCL ER (SR) 150 MG PO TB12
150.0000 mg | ORAL_TABLET | Freq: Two times a day (BID) | ORAL | Status: DC
  Administered 2022-04-11 – 2022-04-13 (×5): 150 mg via ORAL
  Filled 2022-04-10 (×5): qty 1

## 2022-04-10 MED ORDER — ENSURE PRE-SURGERY PO LIQD: 296.0000 mL | Freq: Once | ORAL | Status: DC

## 2022-04-10 MED ORDER — ALUM & MAG HYDROXIDE-SIMETH 200-200-20 MG/5ML PO SUSP: 30.0000 mL | Freq: Four times a day (QID) | ORAL | Status: DC | PRN

## 2022-04-10 MED ORDER — PROPOFOL 10 MG/ML IV BOLUS
INTRAVENOUS | Status: AC
  Filled 2022-04-10: qty 20

## 2022-04-10 MED ORDER — MIRABEGRON ER 25 MG PO TB24
50.0000 mg | ORAL_TABLET | Freq: Every day | ORAL | Status: DC
  Administered 2022-04-11 – 2022-04-13 (×3): 50 mg via ORAL
  Filled 2022-04-10 (×3): qty 2

## 2022-04-10 MED ORDER — ALVIMOPAN 12 MG PO CAPS
12.0000 mg | ORAL_CAPSULE | ORAL | Status: AC
  Administered 2022-04-10: 12 mg via ORAL

## 2022-04-10 MED ORDER — DIPHENHYDRAMINE HCL 12.5 MG/5ML PO ELIX: 12.5000 mg | ORAL_SOLUTION | Freq: Four times a day (QID) | ORAL | Status: DC | PRN

## 2022-04-10 MED ORDER — BUSPIRONE HCL 5 MG PO TABS
15.0000 mg | ORAL_TABLET | Freq: Three times a day (TID) | ORAL | Status: DC
  Administered 2022-04-10 – 2022-04-13 (×9): 15 mg via ORAL
  Filled 2022-04-10 (×9): qty 3

## 2022-04-10 MED ORDER — HEPARIN SODIUM (PORCINE) 5000 UNIT/ML IJ SOLN
5000.0000 [IU] | Freq: Three times a day (TID) | INTRAMUSCULAR | Status: DC
  Administered 2022-04-10 – 2022-04-13 (×8): 5000 [IU] via SUBCUTANEOUS
  Filled 2022-04-10 (×8): qty 1

## 2022-04-10 MED ORDER — ALVIMOPAN 12 MG PO CAPS
ORAL_CAPSULE | ORAL | Status: AC
  Filled 2022-04-10: qty 1

## 2022-04-10 MED ORDER — HEPARIN SODIUM (PORCINE) 5000 UNIT/ML IJ SOLN
5000.0000 [IU] | Freq: Once | INTRAMUSCULAR | Status: AC
  Administered 2022-04-10: 5000 [IU] via SUBCUTANEOUS

## 2022-04-10 MED ORDER — ENSURE SURGERY PO LIQD
237.0000 mL | Freq: Two times a day (BID) | ORAL | Status: DC
  Administered 2022-04-10 – 2022-04-13 (×6): 237 mL via ORAL
  Filled 2022-04-10 (×9): qty 237

## 2022-04-10 MED ORDER — VITAMIN C 500 MG PO TABS
1000.0000 mg | ORAL_TABLET | Freq: Every day | ORAL | Status: DC
  Administered 2022-04-10 – 2022-04-13 (×4): 1000 mg via ORAL
  Filled 2022-04-10 (×6): qty 2

## 2022-04-10 MED ORDER — ROCURONIUM BROMIDE 10 MG/ML (PF) SYRINGE
PREFILLED_SYRINGE | INTRAVENOUS | Status: AC
  Filled 2022-04-10: qty 10

## 2022-04-10 MED ORDER — VALACYCLOVIR HCL 500 MG PO TABS: 500.0000 mg | ORAL_TABLET | Freq: Two times a day (BID) | ORAL | Status: DC | PRN

## 2022-04-10 MED ORDER — SCOPOLAMINE 1 MG/3DAYS TD PT72
MEDICATED_PATCH | TRANSDERMAL | Status: DC | PRN
  Administered 2022-04-10: 1 via TRANSDERMAL

## 2022-04-10 MED ORDER — FENTANYL CITRATE (PF) 250 MCG/5ML IJ SOLN
INTRAMUSCULAR | Status: DC | PRN
  Administered 2022-04-10 (×2): 50 ug via INTRAVENOUS
  Administered 2022-04-10: 100 ug via INTRAVENOUS

## 2022-04-10 MED ORDER — ACETAMINOPHEN 500 MG PO TABS
1000.0000 mg | ORAL_TABLET | ORAL | Status: AC
  Administered 2022-04-10: 1000 mg via ORAL

## 2022-04-10 MED ORDER — LIDOCAINE 2% (20 MG/ML) 5 ML SYRINGE
INTRAMUSCULAR | Status: DC | PRN
  Administered 2022-04-10: 60 mg via INTRAVENOUS

## 2022-04-10 MED ORDER — ALVIMOPAN 12 MG PO CAPS
12.0000 mg | ORAL_CAPSULE | Freq: Two times a day (BID) | ORAL | Status: DC
  Filled 2022-04-10: qty 1

## 2022-04-10 MED ORDER — MOMETASONE FURO-FORMOTEROL FUM 100-5 MCG/ACT IN AERO
2.0000 | INHALATION_SPRAY | Freq: Two times a day (BID) | RESPIRATORY_TRACT | Status: DC
  Administered 2022-04-10 – 2022-04-13 (×5): 2 via RESPIRATORY_TRACT
  Filled 2022-04-10: qty 8.8

## 2022-04-10 MED ORDER — SUMATRIPTAN SUCCINATE 50 MG PO TABS: 100.0000 mg | ORAL_TABLET | ORAL | Status: DC | PRN

## 2022-04-10 MED ORDER — TRAZODONE HCL 50 MG PO TABS
50.0000 mg | ORAL_TABLET | Freq: Every day | ORAL | Status: DC
  Administered 2022-04-11 – 2022-04-12 (×2): 50 mg via ORAL
  Filled 2022-04-10 (×3): qty 1

## 2022-04-10 MED ORDER — PANTOPRAZOLE SODIUM 40 MG PO TBEC
40.0000 mg | DELAYED_RELEASE_TABLET | Freq: Two times a day (BID) | ORAL | Status: DC
  Administered 2022-04-10 – 2022-04-13 (×6): 40 mg via ORAL
  Filled 2022-04-10 (×6): qty 1

## 2022-04-10 MED ORDER — MIDAZOLAM HCL 5 MG/5ML IJ SOLN
INTRAMUSCULAR | Status: DC | PRN
  Administered 2022-04-10: 2 mg via INTRAVENOUS

## 2022-04-10 MED ORDER — BUPIVACAINE LIPOSOME 1.3 % IJ SUSP
INTRAMUSCULAR | Status: DC | PRN
  Administered 2022-04-10: 20 mL

## 2022-04-10 MED ORDER — SCOPOLAMINE 1 MG/3DAYS TD PT72
MEDICATED_PATCH | TRANSDERMAL | Status: AC
  Filled 2022-04-10: qty 1

## 2022-04-10 MED ORDER — HYDROXYZINE HCL 25 MG PO TABS: 50.0000 mg | ORAL_TABLET | Freq: Every evening | ORAL | Status: DC | PRN

## 2022-04-10 MED ORDER — ACETAMINOPHEN 500 MG PO TABS
ORAL_TABLET | ORAL | Status: AC
  Filled 2022-04-10: qty 2

## 2022-04-10 MED ORDER — FAMOTIDINE 20 MG PO TABS
20.0000 mg | ORAL_TABLET | Freq: Every day | ORAL | Status: DC
  Administered 2022-04-10 – 2022-04-12 (×3): 20 mg via ORAL
  Filled 2022-04-10 (×3): qty 1

## 2022-04-10 MED ORDER — LORATADINE 10 MG PO TABS
10.0000 mg | ORAL_TABLET | Freq: Every day | ORAL | Status: DC
  Administered 2022-04-11 – 2022-04-13 (×3): 10 mg via ORAL
  Filled 2022-04-10 (×3): qty 1

## 2022-04-10 MED ORDER — ONDANSETRON HCL 4 MG/2ML IJ SOLN: 4.0000 mg | Freq: Once | INTRAMUSCULAR | Status: DC | PRN

## 2022-04-10 MED ORDER — ACETAMINOPHEN 500 MG PO TABS
1000.0000 mg | ORAL_TABLET | Freq: Four times a day (QID) | ORAL | Status: DC
  Administered 2022-04-10 – 2022-04-13 (×13): 1000 mg via ORAL
  Filled 2022-04-10 (×13): qty 2

## 2022-04-10 MED ORDER — FENTANYL CITRATE (PF) 250 MCG/5ML IJ SOLN
INTRAMUSCULAR | Status: AC
  Filled 2022-04-10: qty 5

## 2022-04-10 MED ORDER — OXYCODONE HCL 5 MG/5ML PO SOLN: 5.0000 mg | Freq: Once | ORAL | Status: DC | PRN

## 2022-04-10 MED ORDER — LIDOCAINE HCL (PF) 2 % IJ SOLN
INTRAMUSCULAR | Status: AC
  Filled 2022-04-10: qty 5

## 2022-04-10 MED ORDER — HEPARIN SODIUM (PORCINE) 5000 UNIT/ML IJ SOLN
INTRAMUSCULAR | Status: AC
  Filled 2022-04-10: qty 1

## 2022-04-10 MED ORDER — MIDAZOLAM HCL 2 MG/2ML IJ SOLN
INTRAMUSCULAR | Status: AC
  Filled 2022-04-10: qty 2

## 2022-04-10 MED ORDER — ONDANSETRON HCL 4 MG/2ML IJ SOLN: 4.0000 mg | Freq: Four times a day (QID) | INTRAMUSCULAR | Status: DC | PRN

## 2022-04-10 SURGICAL SUPPLY — 49 items
BAG HAMPER (MISCELLANEOUS) ×1 IMPLANT
COVER LIGHT HANDLE STERIS (MISCELLANEOUS) ×4 IMPLANT
DRSG OPSITE POSTOP 4X10 (GAUZE/BANDAGES/DRESSINGS) IMPLANT
DRSG OPSITE POSTOP 4X8 (GAUZE/BANDAGES/DRESSINGS) IMPLANT
ELECT REM PT RETURN 9FT ADLT (ELECTROSURGICAL) ×1
ELECTRODE REM PT RTRN 9FT ADLT (ELECTROSURGICAL) ×1 IMPLANT
GAUZE 4X4 16PLY ~~LOC~~+RFID DBL (SPONGE) IMPLANT
GAUZE SPONGE 4X4 12PLY STRL (GAUZE/BANDAGES/DRESSINGS) IMPLANT
GLOVE BIO SURGEON STRL SZ 6.5 (GLOVE) ×1 IMPLANT
GLOVE BIOGEL PI IND STRL 6.5 (GLOVE) ×1 IMPLANT
GLOVE BIOGEL PI IND STRL 7.0 (GLOVE) ×2 IMPLANT
GLOVE BIOGEL PI IND STRL 7.5 (GLOVE) IMPLANT
GLOVE ECLIPSE 6.5 STRL STRAW (GLOVE) IMPLANT
GLOVE SURG SS PI 7.5 STRL IVOR (GLOVE) IMPLANT
GOWN STRL REUS W/TWL LRG LVL3 (GOWN DISPOSABLE) ×6 IMPLANT
HANDLE SUCTION POOLE (INSTRUMENTS) IMPLANT
INST SET MAJOR GENERAL (KITS) ×1 IMPLANT
KIT BLADEGUARD II DBL (SET/KITS/TRAYS/PACK) ×1 IMPLANT
KIT TURNOVER KIT A (KITS) ×1 IMPLANT
LIGASURE IMPACT 36 18CM CVD LR (INSTRUMENTS) ×1 IMPLANT
MANIFOLD NEPTUNE II (INSTRUMENTS) ×1 IMPLANT
NDL HYPO 18GX1.5 BLUNT FILL (NEEDLE) ×1 IMPLANT
NDL HYPO 21X1.5 SAFETY (NEEDLE) ×1 IMPLANT
NEEDLE HYPO 18GX1.5 BLUNT FILL (NEEDLE) ×1 IMPLANT
NEEDLE HYPO 21X1.5 SAFETY (NEEDLE) ×1 IMPLANT
NS IRRIG 1000ML POUR BTL (IV SOLUTION) ×2 IMPLANT
PACK COLON (CUSTOM PROCEDURE TRAY) ×1 IMPLANT
PAD ARMBOARD 7.5X6 YLW CONV (MISCELLANEOUS) ×1 IMPLANT
PENCIL SMOKE EVACUATOR COATED (MISCELLANEOUS) ×1 IMPLANT
RELOAD PROXIMATE 75MM BLUE (ENDOMECHANICALS) ×2 IMPLANT
RELOAD STAPLE 75 3.8 BLU REG (ENDOMECHANICALS) IMPLANT
RETRACTOR WND ALEXIS-O 25 LRG (MISCELLANEOUS) IMPLANT
RTRCTR WOUND ALEXIS O 25CM LRG (MISCELLANEOUS) ×1
SHEET LAVH (DRAPES) ×1 IMPLANT
SPONGE T-LAP 18X18 ~~LOC~~+RFID (SPONGE) ×3 IMPLANT
STAPLER GUN LINEAR PROX 60 (STAPLE) ×1 IMPLANT
STAPLER PROXIMATE 75MM BLUE (STAPLE) IMPLANT
STAPLER VISISTAT (STAPLE) ×1 IMPLANT
SUCTION POOLE HANDLE (INSTRUMENTS) ×1
SUT CHROMIC 0 SH (SUTURE) IMPLANT
SUT CHROMIC 2 0 SH (SUTURE) IMPLANT
SUT CHROMIC 3 0 SH 27 (SUTURE) IMPLANT
SUT PDS AB CT VIOLET #0 27IN (SUTURE) ×2 IMPLANT
SUT SILK 3 0 SH CR/8 (SUTURE) ×1 IMPLANT
SUT VIC AB 3-0 SH 27 (SUTURE) ×1
SUT VIC AB 3-0 SH 27X BRD (SUTURE) ×1 IMPLANT
SYR 20ML LL LF (SYRINGE) ×1 IMPLANT
TRAY FOLEY MTR SLVR 16FR STAT (SET/KITS/TRAYS/PACK) ×1 IMPLANT
YANKAUER SUCT BULB TIP 10FT TU (MISCELLANEOUS) ×1 IMPLANT

## 2022-04-10 NOTE — Op Note (Signed)
[facility] Operative Note  04/10/22  Preoperative Diagnosis:  Multiple adenomatous polyps throughout colon    Postoperative Diagnosis: Same   Procedure(s) Performed:  Total colectomy    Surgeon: Lanell Matar. Constance Haw, MD   Assistants: [name], MD     Anesthesia: General endotracheal   Anesthesiologist: Louann Sjogren, MD    Specimens: Colon   Estimated Blood Loss: Minimal   Blood Replacement: None    Complications: None   Wound Class: Clean contaminated    Operative Indications: Ms. Joens is a 52 yo with multiple polyps throughout her colon with sparing of her sigmoid and rectum. We discussed subtotal/ total colectomy and risk of bleeding, infection, anastomotic leak, diarrhea and imodium use after surgery, injury to ureter. She opted to proceed.   Findings: Redundant distended colon; rectosigmoid junction anastomosis    Procedure: The patient was taken to the operating room and placed supine. General endotracheal anesthesia was induced. Intravenous antibiotics were  administered per protocol.  An orogastric tube positioned to decompress the stomach. A foley was placed. She was then placed in lithotomy with all pressure points padded.  The abdomen and perineal area were prepared draped in the usual sterile fashion.   A midline incision was made around her umbilicus and carried down to the fascia. Care was taken to enter the fascia and the peritoneal cavity with scissors. Omentum was adherent to the anterior abdominal wall from a prior incision. This was taken down with cautery, and a wound protector was placed.  The patient was noted to have a redundant colon. With care the small bowel was packed into the abdomen.   We turned our attention to the right colon and cecum and took down the white line of Toldt and the hepatic flexure was taken down with the ligasure.  The small bowel was transected at the terminal ileum with a 75 mm linear cutting stapler, and  the mesentery was taken with the ligasure, protecting the retroperitoneal structures like the ureter and duodenum by staying high on the mesentery. The gastrocolic ligaments and omentum were dissected out with the ligasure as well as the mesentery of the transverse colon.  The splenic flexure was taken down with blunt dissection and cautery, and we then proceeded to take down the white line on the descending colon with cautery.  Once this was down, we completed the mesentery ligation with the ligasure down to the sigmoid colon. The sigmoid colon was redundant and looping into the pelvis. Again the peritoneal reflections going down into the pelvis were scored and the loop of sigmoid looping down was freed up from the rectosigmoid junction. Care again was taken to protect the left ureter by staying high on the mesentery. The ureter was noted in the retroperitoneal tissue and protected.  From here the mesentery down to the rectosigmoid junction was taken with the ligasure, and the distal point of transection was taken with a 75 mm linear cutting stapler since the rectosigmoid was also very redundant.  The colon was removed from the abdomen and sent as a specimen. Irrigation was performed. The retroperitoneal structures including ureters were again inspected and noted to be safe.  The small bowel and rectosigmoid were then placed side by side in an antimesenteric/ fanning tinea fascia to ensure a good staple line between the walls of the bowel.  The small bowel was laid out to ensure no twisting.  An enterotomy and colotomy were made after towels were placed, and a common staple line  was created with a 75 mm linear cutting staples. The common enterotomy left was closed with a TA 60 stapler. The anastomosis was noted to be 2 fingerbreadths, and the staple line was oversewn with 3-0 Silk suture and two crotch sutures were placed with 3-0 Silk suture.  The mesenteric defect was closed with 2-0 Chromic gut suture. The small  bowel fell into the pelvis without any tension on the anastomosis.   Dr. Arnoldo Morale was assisting throughout the procedure and was present for the critical portions of the case including the dissection and the anastomosis.   The wound protector was removed. The entire team changed gown and gloves and new equipment was used. The fascia was closed with 0 PDS suture in the standard fashion. Exparel was injected. The wound was irrigated and closed with staples. A honeycomb dressing was placed.   Final inspection revealed acceptable hemostasis. All counts were correct at the end of the case. The foley and OG were removed. The patient was awakened from anesthesia and extubate without complication.  The patient went to the PACU in stable condition.   Curlene Labrum, MD [facility] [address] [place], [place] [address] [phone] (office)

## 2022-04-10 NOTE — Progress Notes (Signed)
[facility]  Updated brother. Telemetry bed. PRN for pain, scheduled tylenol Binder Clear diet Await bowel function, could have bloody Bms and will ultimately need imodium Monitor urine, foley out Labs in AM SCDs, heparin sq Ambulate  Curlene Labrum, MD [facility] [address] [place], [place] [address] [phone] (office)

## 2022-04-10 NOTE — Transfer of Care (Signed)
Immediate Anesthesia Transfer of Care Note  Patient: Alexis Harris  Procedure(s) Performed: TOTAL COLECTOMY (Abdomen)  Patient Location: PACU  Anesthesia Type:General  Level of Consciousness: awake, alert , oriented, and patient cooperative  Airway & Oxygen Therapy: Patient Spontanous Breathing and Patient connected to nasal cannula oxygen  Post-op Assessment: Report given to RN, Post -op Vital signs reviewed and stable, and Patient moving all extremities  Post vital signs: Reviewed and stable  Last Vitals:  Vitals Value Taken Time  BP 96/72 04/10/22 1015  Temp    Pulse 94 04/10/22 1017  Resp 25 04/10/22 1017  SpO2 93 % 04/10/22 1017  Vitals shown include unvalidated device data.  Last Pain:  Vitals:   04/10/22 0708  PainSc: 4          Complications: No notable events documented.

## 2022-04-10 NOTE — Progress Notes (Signed)
[facility]  Reviewed EKG and discussed with Dr. Briant Cedar. No complaints of chest pain or SOB from the patient. EKG difference likely lead placement artifact.  Curlene Labrum, MD [facility] [address] [place], [place] [address] [phone] (office)

## 2022-04-10 NOTE — Anesthesia Procedure Notes (Signed)
Procedure Name: Intubation Date/Time: 04/10/2022 7:37 AM  Performed by: Myna Bright, CRNAPre-anesthesia Checklist: Patient identified, Emergency Drugs available, Suction available and Patient being monitored Patient Re-evaluated:Patient Re-evaluated prior to induction Oxygen Delivery Method: Circle system utilized, Simple face mask, Non-rebreather mask and Nasal cannula Preoxygenation: Pre-oxygenation with 100% oxygen Induction Type: IV induction Ventilation: Mask ventilation without difficulty Laryngoscope Size: Mac and 3 Grade View: Grade I Tube type: Oral Tube size: 7.0 mm Number of attempts: 1 Airway Equipment and Method: Stylet Placement Confirmation: ETT inserted through vocal cords under direct vision, positive ETCO2 and breath sounds checked- equal and bilateral Secured at: 21 cm Tube secured with: Tape Dental Injury: Teeth and Oropharynx as per pre-operative assessment

## 2022-04-10 NOTE — Interval H&P Note (Signed)
History and Physical Interval Note:  04/10/2022 7:19 AM  Alexis Harris  has presented today for surgery, with the diagnosis of MULTIPLE ADENOMATOUS POLYPS, COLON.  The various methods of treatment have been discussed with the patient and family. After consideration of risks, benefits and other options for treatment, the patient has consented to  Procedure(s): PARTIAL COLECTOMY W/ ANASTAMOSIS (N/A) as a surgical intervention.  The patient's history has been reviewed, patient examined, no change in status, stable for surgery.  I have reviewed the patient's chart and labs.  Questions were answered to the patient's satisfaction.    Plan for colectomy. Bowel prep completed.  Virl Cagey

## 2022-04-10 NOTE — Anesthesia Preprocedure Evaluation (Signed)
Anesthesia Evaluation  Patient identified by MRN, date of birth, ID band Patient awake    Reviewed: Allergy & Precautions, H&P , NPO status , Patient's Chart, lab work & pertinent test results, reviewed documented beta blocker date and time   History of Anesthesia Complications (+) PONV and history of anesthetic complications  Airway Mallampati: II  TM Distance: >3 FB Neck ROM: full    Dental no notable dental hx.    Pulmonary asthma , Current Smoker   Pulmonary exam normal breath sounds clear to auscultation       Cardiovascular Exercise Tolerance: Good negative cardio ROS  Rhythm:regular Rate:Normal     Neuro/Psych  Headaches PSYCHIATRIC DISORDERS Anxiety Depression     Neuromuscular disease    GI/Hepatic Neg liver ROS,GERD  Medicated,,  Endo/Other  negative endocrine ROSdiabetes, Type 2    Renal/GU negative Renal ROS  negative genitourinary   Musculoskeletal   Abdominal   Peds  Hematology negative hematology ROS (+)   Anesthesia Other Findings   Reproductive/Obstetrics negative OB ROS                             Anesthesia Physical Anesthesia Plan  ASA: 3  Anesthesia Plan: General and General ETT   Post-op Pain Management:    Induction:   PONV Risk Score and Plan: Ondansetron  Airway Management Planned:   Additional Equipment:   Intra-op Plan:   Post-operative Plan:   Informed Consent: I have reviewed the patients History and Physical, chart, labs and discussed the procedure including the risks, benefits and alternatives for the proposed anesthesia with the patient or authorized representative who has indicated his/her understanding and acceptance.     Dental Advisory Given  Plan Discussed with: CRNA  Anesthesia Plan Comments:        Anesthesia Quick Evaluation

## 2022-04-10 NOTE — Progress Notes (Signed)
Pt status update given to Sean-brother. Questions answered. Voiced understanding.

## 2022-04-11 LAB — MAGNESIUM: Magnesium: 1.9 mg/dL (ref 1.7–2.4)

## 2022-04-11 LAB — CBC WITH DIFFERENTIAL/PLATELET
Abs Immature Granulocytes: 0.04 10*3/uL (ref 0.00–0.07)
Basophils Absolute: 0 10*3/uL (ref 0.0–0.1)
Basophils Relative: 0 %
Eosinophils Absolute: 0 10*3/uL (ref 0.0–0.5)
Eosinophils Relative: 0 %
HCT: 35.3 % — ABNORMAL LOW (ref 36.0–46.0)
Hemoglobin: 11.7 g/dL — ABNORMAL LOW (ref 12.0–15.0)
Immature Granulocytes: 0 %
Lymphocytes Relative: 15 %
Lymphs Abs: 1.4 10*3/uL (ref 0.7–4.0)
MCH: 31.5 pg (ref 26.0–34.0)
MCHC: 33.1 g/dL (ref 30.0–36.0)
MCV: 94.9 fL (ref 80.0–100.0)
Monocytes Absolute: 1 10*3/uL (ref 0.1–1.0)
Monocytes Relative: 10 %
Neutro Abs: 7.2 10*3/uL (ref 1.7–7.7)
Neutrophils Relative %: 75 %
Platelets: 248 10*3/uL (ref 150–400)
RBC: 3.72 MIL/uL — ABNORMAL LOW (ref 3.87–5.11)
RDW: 14.6 % (ref 11.5–15.5)
WBC: 9.6 10*3/uL (ref 4.0–10.5)
nRBC: 0 % (ref 0.0–0.2)

## 2022-04-11 LAB — GLUCOSE, CAPILLARY
Glucose-Capillary: 127 mg/dL — ABNORMAL HIGH (ref 70–99)
Glucose-Capillary: 95 mg/dL (ref 70–99)
Glucose-Capillary: 129 mg/dL — ABNORMAL HIGH (ref 70–99)
Glucose-Capillary: 164 mg/dL — ABNORMAL HIGH (ref 70–99)

## 2022-04-11 LAB — BASIC METABOLIC PANEL
Anion gap: 9 (ref 5–15)
BUN: 10 mg/dL (ref 6–20)
CO2: 21 mmol/L — ABNORMAL LOW (ref 22–32)
Calcium: 8.6 mg/dL — ABNORMAL LOW (ref 8.9–10.3)
Chloride: 107 mmol/L (ref 98–111)
Creatinine, Ser: 0.71 mg/dL (ref 0.44–1.00)
GFR, Estimated: >60 mL/min (ref >60)
Glucose, Bld: 129 mg/dL — ABNORMAL HIGH (ref 70–99)
Potassium: 3.4 mmol/L — ABNORMAL LOW (ref 3.5–5.1)
Sodium: 137 mmol/L (ref 135–145)

## 2022-04-11 LAB — PHOSPHORUS: Phosphorus: 3.1 mg/dL (ref 2.5–4.6)

## 2022-04-11 LAB — SURGICAL PATHOLOGY

## 2022-04-11 MED ORDER — POTASSIUM CHLORIDE 20 MEQ PO PACK
40.0000 meq | PACK | Freq: Once | ORAL | Status: AC
  Administered 2022-04-11: 40 meq via ORAL
  Filled 2022-04-11: qty 2

## 2022-04-11 MED ORDER — INSULIN ASPART 100 UNIT/ML IJ SOLN
0.0000 [IU] | Freq: Three times a day (TID) | INTRAMUSCULAR | Status: DC
  Administered 2022-04-11 – 2022-04-12 (×3): 2 [IU] via SUBCUTANEOUS
  Administered 2022-04-12: 3 [IU] via SUBCUTANEOUS
  Administered 2022-04-12 – 2022-04-13 (×3): 2 [IU] via SUBCUTANEOUS

## 2022-04-11 MED ORDER — AEROCHAMBER PLUS FLO-VU MEDIUM MISC
1.0000 | Freq: Once | Status: AC
  Administered 2022-04-11: 1

## 2022-04-11 MED ORDER — OXYCODONE HCL 5 MG PO TABS
5.0000 mg | ORAL_TABLET | ORAL | Status: DC | PRN
  Administered 2022-04-11: 10 mg via ORAL
  Administered 2022-04-11: 5 mg via ORAL
  Administered 2022-04-11: 10 mg via ORAL
  Administered 2022-04-12 (×2): 5 mg via ORAL
  Filled 2022-04-11 (×2): qty 2
  Filled 2022-04-11 (×3): qty 1

## 2022-04-11 MED ORDER — LOPERAMIDE HCL 2 MG PO CAPS
2.0000 mg | ORAL_CAPSULE | Freq: Three times a day (TID) | ORAL | Status: DC
  Administered 2022-04-11 – 2022-04-13 (×9): 2 mg via ORAL
  Filled 2022-04-11 (×9): qty 1

## 2022-04-11 MED ORDER — INSULIN ASPART 100 UNIT/ML IJ SOLN: 0.0000 [IU] | Freq: Every day | INTRAMUSCULAR | Status: DC

## 2022-04-11 NOTE — Progress Notes (Signed)
Pt states has only had one more small loose stool since early afternoon. Pt continues to ambulate well in the hallway with out assistance. States pain much better controlled with oral narcotic admin'd. Tolerating diet without n/v.

## 2022-04-11 NOTE — Progress Notes (Signed)
[facility] Progress Note  1 Day Post-Op  Subjective:  Pain is worse this morning, is a general abdominal pain / soreness, had not received hydromorphone since last night.  Slept okay overnight. Endorses 5-6 loose bowel movements since surgery. Has ambulated to the bathroom and tolerated well. No issues with urination, denies blood in urine. Denies nausea, vomiting, tolerated liquid diet well yesterday post-op and was able to tolerate grits this morning.  Objective: Vital signs in last 24 hours: Temp:  [98 F (36.7 C)-98.6 F (37 C)] 98 F (36.7 C) (01/09 0343) Pulse Rate:  [75-95] 75 (01/09 0343) Resp:  [10-21] 20 (01/09 0343) BP: (96-124)/(62-104) 108/68 (01/09 0343) SpO2:  [86 %-100 %] 96 % (01/09 0343) Weight:  [85.7 kg] 85.7 kg (01/09 0500) Last BM Date : 04/10/22  Intake/Output from previous day: 01/08 0701 - 01/09 0700 In: 2256.2 [I.V.:2156.2; IV Piggyback:100] Out: 410 [Urine:360; Blood:50]  General appearance: alert and in mild distress Head: Normocephalic, without obvious abnormality, atraumatic Resp: No increased work of breathing on room air GI: Soft. Incision with honeycomb in place, dry, some dried serosanguinous staining in upper 1/4 of dressing, otherwise dressing clean. No erythema or active drainage.  Extremities: extremities normal, atraumatic, no cyanosis or edema Neurologic: Grossly normal, no focal deficit  Lab Results:  Recent Labs    04/11/22 0319  WBC 9.6  HGB 11.7*  HCT 35.3*  PLT 248   BMET Recent Labs    04/11/22 0319  NA 137  K 3.4*  CL 107  CO2 21*  GLUCOSE 129*  BUN 10  CREATININE 0.71  CALCIUM 8.6*    Anti-infectives: Anti-infectives (From admission, onward)    Start     Dose/Rate Route Frequency Ordered Stop   04/10/22 1306  valACYclovir (VALTREX) tablet 500 mg        500 mg Oral 2 times daily PRN 04/10/22 1306     04/10/22 0630  ertapenem (INVANZ) 1,000 mg in sodium chloride 0.9 % 100 mL IVPB        1  g 200 mL/hr over 30 Minutes Intravenous On call to O.R. 04/10/22 0962 04/10/22 8366   04/10/22 0626  sodium chloride 0.9 % with ertapenem [facility]) ADS Med       Note to Pharmacy: Donnamarie Rossetti S: cabinet override      04/10/22 0626 04/10/22 0746       Assessment/Plan: s/p Procedure(s): TOTAL COLECTOMY Patient doing well on post-op day 1.  -- Will plan to advance diet as tolerated and add imodium with meals and at bedtime given already having loose stools, to be expected post-colectomy.  -- Pain control plan: patient noted in chart to have nausea/vomiting with codeine but would like to try roxicodone, will transition from dilaudid injection to PO roxicodone as pain improves. Patient also has chronic pain regimen including buprenorphine patch prescribed by Dr. Ermalene Postin, neurologist in [facility], consider restarting on discharge -- Continue ambulation goals -- Drop in hemoglobin is to be expected post-op, minimal EBL during procedure but patient received LR throughout, will plan to recheck tomorrow.    LOS: 1 day    Blair Mesina 04/11/2022

## 2022-04-11 NOTE — Anesthesia Postprocedure Evaluation (Signed)
Anesthesia Post Note  Patient: Alexis Harris  Procedure(s) Performed: TOTAL COLECTOMY (Abdomen)  Patient location during evaluation: Phase II Anesthesia Type: General Level of consciousness: awake Pain management: pain level controlled Vital Signs Assessment: post-procedure vital signs reviewed and stable Respiratory status: spontaneous breathing and respiratory function stable Cardiovascular status: blood pressure returned to baseline and stable Postop Assessment: no headache and no apparent nausea or vomiting Anesthetic complications: no Comments: Late entry   No notable events documented.   Last Vitals:  Vitals:   04/10/22 2100 04/11/22 0343  BP: 101/68 108/68  Pulse: 80 75  Resp: 20 20  Temp: 37 C 36.7 C  SpO2: 93% 96%    Last Pain:  Vitals:   04/11/22 0550  TempSrc:   PainSc: [place]

## 2022-04-11 NOTE — Progress Notes (Signed)
Pt sleeping at this time post pain med admin. Pt has tolerated full liquid diet x2 today, has ambulated alone in hallways multiple times. Pt has had several loose BM's today which MD is aware of, meds admin'd per order.

## 2022-04-11 NOTE — Progress Notes (Signed)
[name] resting in her bed at this time. Patient given two prn medications during this shift. Patient has walked independently to the bathroom and around the room during this shift. Patient tolerated well.

## 2022-04-11 NOTE — Plan of Care (Signed)
  Problem: Education: Goal: Understanding of discharge needs will improve Outcome: Progressing Goal: Verbalization of understanding of the causes of altered bowel function will improve Outcome: Progressing   Problem: Activity: Goal: Ability to tolerate increased activity will improve Outcome: Progressing   Problem: Bowel/Gastric: Goal: Gastrointestinal status for postoperative course will improve Outcome: Progressing   Problem: Health Behavior/Discharge Planning: Goal: Identification of community resources to assist with postoperative recovery needs will improve Outcome: Progressing   Problem: Nutritional: Goal: Will attain and maintain optimal nutritional status will improve Outcome: Progressing   Problem: Clinical Measurements: Goal: Postoperative complications will be avoided or minimized Outcome: Progressing   Problem: Respiratory: Goal: Respiratory status will improve Outcome: Progressing   Problem: Skin Integrity: Goal: Will show signs of wound healing Outcome: Progressing   Problem: Education: Goal: Knowledge of General Education information will improve Description: Including pain rating scale, medication(s)/side effects and non-pharmacologic comfort measures Outcome: Progressing   Problem: Health Behavior/Discharge Planning: Goal: Ability to manage health-related needs will improve Outcome: Progressing   Problem: Clinical Measurements: Goal: Ability to maintain clinical measurements within normal limits will improve Outcome: Progressing Goal: Will remain free from infection Outcome: Progressing Goal: Diagnostic test results will improve Outcome: Progressing Goal: Respiratory complications will improve Outcome: Progressing Goal: Cardiovascular complication will be avoided Outcome: Progressing   Problem: Activity: Goal: Risk for activity intolerance will decrease Outcome: Progressing   Problem: Nutrition: Goal: Adequate nutrition will be  maintained Outcome: Progressing   Problem: Coping: Goal: Level of anxiety will decrease Outcome: Progressing   Problem: Elimination: Goal: Will not experience complications related to bowel motility Outcome: Progressing Goal: Will not experience complications related to urinary retention Outcome: Progressing   Problem: Pain Managment: Goal: General experience of comfort will improve Outcome: Progressing   Problem: Safety: Goal: Ability to remain free from injury will improve Outcome: Progressing   Problem: Skin Integrity: Goal: Risk for impaired skin integrity will decrease Outcome: Progressing   Problem: Education: Goal: Ability to describe self-care measures that may prevent or decrease complications (Diabetes Survival Skills Education) will improve Outcome: Progressing Goal: Individualized Educational Video(s) Outcome: Progressing   Problem: Coping: Goal: Ability to adjust to condition or change in health will improve Outcome: Progressing   Problem: Fluid Volume: Goal: Ability to maintain a balanced intake and output will improve Outcome: Progressing   Problem: Health Behavior/Discharge Planning: Goal: Ability to identify and utilize available resources and services will improve Outcome: Progressing Goal: Ability to manage health-related needs will improve Outcome: Progressing   Problem: Metabolic: Goal: Ability to maintain appropriate glucose levels will improve Outcome: Progressing   Problem: Nutritional: Goal: Maintenance of adequate nutrition will improve Outcome: Progressing Goal: Progress toward achieving an optimal weight will improve Outcome: Progressing   Problem: Skin Integrity: Goal: Risk for impaired skin integrity will decrease Outcome: Progressing   Problem: Tissue Perfusion: Goal: Adequacy of tissue perfusion will improve Outcome: Progressing

## 2022-04-12 LAB — MAGNESIUM: Magnesium: 1.9 mg/dL (ref 1.7–2.4)

## 2022-04-12 LAB — CBC WITH DIFFERENTIAL/PLATELET
Abs Immature Granulocytes: 0.03 10*3/uL (ref 0.00–0.07)
Basophils Absolute: 0 10*3/uL (ref 0.0–0.1)
Basophils Relative: 0 %
Eosinophils Absolute: 0.1 10*3/uL (ref 0.0–0.5)
Eosinophils Relative: 1 %
HCT: 35.4 % — ABNORMAL LOW (ref 36.0–46.0)
Hemoglobin: 11.7 g/dL — ABNORMAL LOW (ref 12.0–15.0)
Immature Granulocytes: 0 %
Lymphocytes Relative: 23 %
Lymphs Abs: 1.7 10*3/uL (ref 0.7–4.0)
MCH: 31.3 pg (ref 26.0–34.0)
MCHC: 33.1 g/dL (ref 30.0–36.0)
MCV: 94.7 fL (ref 80.0–100.0)
Monocytes Absolute: 0.6 10*3/uL (ref 0.1–1.0)
Monocytes Relative: 9 %
Neutro Abs: 4.9 10*3/uL (ref 1.7–7.7)
Neutrophils Relative %: 67 %
Platelets: 220 10*3/uL (ref 150–400)
RBC: 3.74 MIL/uL — ABNORMAL LOW (ref 3.87–5.11)
RDW: 14.6 % (ref 11.5–15.5)
WBC: 7.4 10*3/uL (ref 4.0–10.5)
nRBC: 0 % (ref 0.0–0.2)

## 2022-04-12 LAB — BASIC METABOLIC PANEL
Anion gap: 11 (ref 5–15)
BUN: 8 mg/dL (ref 6–20)
CO2: 22 mmol/L (ref 22–32)
Calcium: 8.4 mg/dL — ABNORMAL LOW (ref 8.9–10.3)
Chloride: 103 mmol/L (ref 98–111)
Creatinine, Ser: 0.77 mg/dL (ref 0.44–1.00)
GFR, Estimated: >60 mL/min (ref >60)
Glucose, Bld: 138 mg/dL — ABNORMAL HIGH (ref 70–99)
Potassium: 3 mmol/L — ABNORMAL LOW (ref 3.5–5.1)
Sodium: 136 mmol/L (ref 135–145)

## 2022-04-12 LAB — GLUCOSE, CAPILLARY
Glucose-Capillary: 126 mg/dL — ABNORMAL HIGH (ref 70–99)
Glucose-Capillary: 130 mg/dL — ABNORMAL HIGH (ref 70–99)
Glucose-Capillary: 163 mg/dL — ABNORMAL HIGH (ref 70–99)
Glucose-Capillary: 195 mg/dL — ABNORMAL HIGH (ref 70–99)

## 2022-04-12 LAB — PHOSPHORUS: Phosphorus: 3.4 mg/dL (ref 2.5–4.6)

## 2022-04-12 MED ORDER — POTASSIUM CHLORIDE 20 MEQ PO PACK
40.0000 meq | PACK | Freq: Two times a day (BID) | ORAL | Status: AC
  Administered 2022-04-12 (×2): 40 meq via ORAL
  Filled 2022-04-12 (×2): qty 2

## 2022-04-12 NOTE — Progress Notes (Signed)
  Transition of Care [facility]) Screening Note   Patient Details  Name: Alexis Harris Date of Birth: 04-05-1970   Transition of Care [facility]) CM/SW Contact:    Iona Beard, [place] Phone Number: 04/12/2022, 10:15 AM    Transition of Care Department [facility]) has reviewed patient and no TOC needs have been identified at this time. We will continue to monitor patient advancement through interdisciplinary progression rounds. If new patient transition needs arise, please place a TOC consult.

## 2022-04-12 NOTE — Progress Notes (Signed)
[facility] Progress Note  2 Days Post-Op  Subjective: No acute events overnight. Patient doing well this morning, slept okay. At rest, her pain is dull/aching, and she rates it at a 3-4/10 at rest. She has a sharper pain when she moves around, rated at a 7-8/10. Her last dose of PO pain medication was last night.  Denies nausea, vomiting, endorses 2-3 episodes of loose stools since last night. She has been tolerating PO well, able to eat lasagna and vegetables last night, and bacon, egg, and biscuit this morning. She has been tolerating ambulation well. No issues with urination.    Objective: Vital signs in last 24 hours: Temp:  [98.1 F (36.7 C)-99.1 F (37.3 C)] 98.1 F (36.7 C) (01/10 0425) Pulse Rate:  [85-96] 96 (01/10 0425) Resp:  [16-18] 18 (01/10 0425) BP: (107-114)/(65-69) 111/65 (01/10 0425) SpO2:  [91 %-95 %] 94 % (01/10 0425) Weight:  [84.6 kg] 84.6 kg (01/10 0500) Last BM Date : 04/11/22  Intake/Output from previous day: 01/09 0701 - 01/10 0700 In: 1320 [P.O.:1320] Out: -   General appearance: well-appearing female sitting up in no distress Head: Normocephalic, without obvious abnormality, atraumatic Resp: No increased work of breathing on room air GI: Soft. Incision with honeycomb in place, dry, dried serosanguinous staining in upper 1/3 of dressing, otherwise dressing clean. No erythema or active drainage.  Extremities: extremities normal, atraumatic, no cyanosis or edema Neurologic: Grossly normal, no focal deficit  Lab Results:  Recent Labs    04/11/22 0319 04/12/22 0310  WBC 9.6 7.4  HGB 11.7* 11.7*  HCT 35.3* 35.4*  PLT 248 220   BMET Recent Labs    04/11/22 0319 04/12/22 0310  NA 137 136  K 3.4* 3.0*  CL 107 103  CO2 21* 22  GLUCOSE 129* 138*  BUN 10 8  CREATININE 0.71 0.77  CALCIUM 8.6* 8.4*    Anti-infectives: Anti-infectives (From admission, onward)    Start     Dose/Rate Route Frequency Ordered Stop   04/10/22  1306  valACYclovir (VALTREX) tablet 500 mg        500 mg Oral 2 times daily PRN 04/10/22 1306     04/10/22 0630  ertapenem (INVANZ) 1,000 mg in sodium chloride 0.9 % 100 mL IVPB        1 g 200 mL/hr over 30 Minutes Intravenous On call to O.R. 04/10/22 2703 04/10/22 5009   04/10/22 0626  sodium chloride 0.9 % with ertapenem [facility]) ADS Med       Note to Pharmacy: Donnamarie Rossetti S: cabinet override      04/10/22 0626 04/10/22 0746       Assessment/Plan: s/p Procedure(s): TOTAL COLECTOMY  Patient doing well on post-op day 2. Has transitioned to PO pain medication and is tolerating well, with no prns since 11:30 last night. She is tolerating diet advancement. Diarrhea has slowed from yesterday with addition of imodium. She is continuing to ambulate well. Reassuringly hemoglobin stable from yesterday.  - AM CBC  Hypokalemia:  K today is 3.0 from 3.4 yesterday, with 20mq given yesterday. Mag and phos normal.  - Replete K - AM BMP   LOS: 2 days    Alexis Harris 04/12/2022

## 2022-04-12 NOTE — Progress Notes (Signed)
Alert and oriented x4, patients pain being controlled by oral narcotic. Patient ambulated in room without assistance. Patient has not has any more loose stools.

## 2022-04-13 LAB — CBC
HCT: 35.1 % — ABNORMAL LOW (ref 36.0–46.0)
Hemoglobin: 11.6 g/dL — ABNORMAL LOW (ref 12.0–15.0)
MCH: 31.3 pg (ref 26.0–34.0)
MCHC: 33 g/dL (ref 30.0–36.0)
MCV: 94.6 fL (ref 80.0–100.0)
Platelets: 229 10*3/uL (ref 150–400)
RBC: 3.71 MIL/uL — ABNORMAL LOW (ref 3.87–5.11)
RDW: 14.4 % (ref 11.5–15.5)
WBC: 7.9 10*3/uL (ref 4.0–10.5)
nRBC: 0 % (ref 0.0–0.2)

## 2022-04-13 LAB — BASIC METABOLIC PANEL
Anion gap: 10 (ref 5–15)
BUN: 6 mg/dL (ref 6–20)
CO2: 25 mmol/L (ref 22–32)
Calcium: 8.5 mg/dL — ABNORMAL LOW (ref 8.9–10.3)
Chloride: 102 mmol/L (ref 98–111)
Creatinine, Ser: 0.84 mg/dL (ref 0.44–1.00)
GFR, Estimated: >60 mL/min (ref >60)
Glucose, Bld: 130 mg/dL — ABNORMAL HIGH (ref 70–99)
Potassium: 3.8 mmol/L (ref 3.5–5.1)
Sodium: 137 mmol/L (ref 135–145)

## 2022-04-13 LAB — GLUCOSE, CAPILLARY
Glucose-Capillary: 136 mg/dL — ABNORMAL HIGH (ref 70–99)
Glucose-Capillary: 130 mg/dL — ABNORMAL HIGH (ref 70–99)

## 2022-04-13 MED ORDER — LOPERAMIDE HCL 2 MG PO CAPS: 2.0000 mg | ORAL_CAPSULE | Freq: Three times a day (TID) | ORAL | 3 refills | Status: DC

## 2022-04-13 MED ORDER — OXYCODONE HCL 5 MG PO TABS: 5.0000 mg | ORAL_TABLET | ORAL | 0 refills | Status: DC | PRN

## 2022-04-13 MED ORDER — ORAL CARE MOUTH RINSE: 15.0000 mL | OROMUCOSAL | Status: DC | PRN

## 2022-04-13 MED ORDER — ONDANSETRON HCL 4 MG PO TABS: 4.0000 mg | ORAL_TABLET | Freq: Four times a day (QID) | ORAL | 0 refills | Status: DC | PRN

## 2022-04-13 NOTE — Progress Notes (Signed)
Patient alert and oriented x4. Ambulating in hallway and room without assist tolerating well. Pain contolled with oral pain meds. Patient rested through the night.

## 2022-04-13 NOTE — Care Management Important Message (Signed)
Important Message  Patient Details  Name: Alexis Harris MRN: [id number] Date of Birth: Aug 20, 1970   Medicare Important Message Given:  Yes     Tommy Medal 04/13/2022, 11:22 AM

## 2022-04-13 NOTE — Progress Notes (Signed)
Discharge instructions given on medications and follow up visits, patient verbalized understanding. Prescriptions sent to Pharmacy of choice documented in AVS. IV discontinued,catheter intact. Accompanied by staff to an awaiting  vehicle.

## 2022-04-13 NOTE — Progress Notes (Signed)
[facility] Progress Note  3 Days Post-Op  Subjective:  No acute events overnight. Patient feeling well this morning. At rest her pain is a 4/10, increases to 7/10 with changing position. Her pain is tolerable with ambulation. Her last dose of PO roxicodone was last night.   Denies nausea, vomiting. Endorses ~3 episodes of loose stools yesterday during the day and one this morning. She has been tolerating  PO well. No issues with urination.  Objective: Vital signs in last 24 hours: Temp:  [97.6 F (36.4 C)-98.6 F (37 C)] 97.6 F (36.4 C) (01/11 0913) Pulse Rate:  [86-95] 87 (01/11 0913) Resp:  [16-18] 18 (01/11 0913) BP: (98-129)/(59-83) 98/64 (01/11 0913) SpO2:  [92 %-99 %] 94 % (01/11 0913) Weight:  [83 kg] 83 kg (01/11 0548) Last BM Date : 04/12/22  Intake/Output from previous day: 01/10 0701 - 01/11 0700 In: 1200 [P.O.:1200] Out: -  Intake/Output this shift: Total I/O In: 480 [P.O.:480] Out: -   General appearance: well-appearing female in no distress Head: Normocephalic, without obvious abnormality, atraumatic Resp: No increased work of breathing on room air GI: Soft, non-distended, diffuse mild tenderness. Incision with honeycomb in place, dry, dried serosanguinous staining in upper 1/3 of dressing, otherwise dressing clean. No erythema or active drainage.  Extremities: extremities normal, atraumatic, no cyanosis or edema Neurologic: Grossly normal, no focal deficit  Lab Results:  Recent Labs    04/12/22 0310 04/13/22 0405  WBC 7.4 7.9  HGB 11.7* 11.6*  HCT 35.4* 35.1*  PLT 220 229   BMET Recent Labs    04/12/22 0310 04/13/22 0405  NA 136 137  K 3.0* 3.8  CL 103 102  CO2 22 25  GLUCOSE 138* 130*  BUN 8 6  CREATININE 0.77 0.84  CALCIUM 8.4* 8.5*    Anti-infectives: Anti-infectives (From admission, onward)    Start     Dose/Rate Route Frequency Ordered Stop   04/10/22 1306  valACYclovir (VALTREX) tablet 500 mg        500 mg  Oral 2 times daily PRN 04/10/22 1306     04/10/22 0630  ertapenem (INVANZ) 1,000 mg in sodium chloride 0.9 % 100 mL IVPB        1 g 200 mL/hr over 30 Minutes Intravenous On call to O.R. 04/10/22 5427 04/10/22 0623   04/10/22 0626  sodium chloride 0.9 % with ertapenem [facility]) ADS Med       Note to Pharmacy: Donnamarie Rossetti S: cabinet override      04/10/22 0626 04/10/22 0746       Assessment/Plan: s/p Procedure(s): TOTAL COLECTOMY  Patient doing well post-op day 3. Pain is controlled with PO medication, incision non-erythematous, dry and intact, she continues to tolerate diet advancement with no nausea or vomiting. She is continuing to ambulate well. Loose stools are slowing with imodium. Hemoglobin stable from yesterday. She is feeling ready to discharge today and feels like she will be able to care for herself at home with the support of her brother.   Hypokalemia - resolved with repletion.     LOS: 3 days    Alexis Harris [facility] 04/13/2022

## 2022-04-13 NOTE — Discharge Summary (Signed)
Physician Discharge Summary  Patient ID: Alexis Harris MRN: [id number] DOB/AGE: 09/13/1970 52 y.o.  Admit date: 04/10/2022 Discharge date: 04/13/2022  Admission Diagnoses: Colon polyps  Discharge Diagnoses:  Principal Problem:   Multiple adenomatous polyps Active Problems:   Adenomatous polyps   Discharged Condition: good  Hospital Course: [name] is a 52 yo with multiple polyps. who had a total colectomy and side to side anastomosis with ileum and her rectosigmoid junction. Post operatively she did well and has been eating, ambulating, and having pain controlled with oral pain meds. She is seen by Dr. Creig Hines for her chronic pain and has removed her patch a few weeks before surgery. She is currently having adequate pain control with roxicodone '5mg'$  q4 PRN taking 2-3 tables a day. I notified Dr. [facility] office of a new prescription for this due to her colectomy.    Before going home she was eating, having Bms, taking imodium to control the number and character of the Bms, and was feeling up to going home.   Consults: None  Significant Diagnostic Studies:   Latest Reference Range & Units 04/13/22 04:05  Sodium 135 - 145 mmol/L 137  Potassium 3.5 - 5.1 mmol/L 3.8  Chloride 98 - 111 mmol/L 102  CO2 22 - 32 mmol/L 25  Glucose 70 - 99 mg/dL 130 (H)  BUN 6 - 20 mg/dL 6  Creatinine 0.44 - 1.00 mg/dL 0.84  Calcium 8.9 - 10.3 mg/dL 8.5 (L)  Anion gap 5 - 15  10  GFR, Estimated >60 mL/min >60  WBC 4.0 - 10.5 K/uL 7.9  RBC 3.87 - 5.11 MIL/uL 3.71 (L)  Hemoglobin 12.0 - 15.0 g/dL 11.6 (L)  HCT 36.0 - 46.0 % 35.1 (L)  MCV 80.0 - 100.0 fL 94.6  MCH 26.0 - 34.0 pg 31.3  MCHC 30.0 - 36.0 g/dL 33.0  RDW 11.5 - 15.5 % 14.4  Platelets 150 - 400 K/uL 229  nRBC 0.0 - 0.2 % 0.0  (H): Data is abnormally high (L): Data is abnormally low  Treatments: IV hydration and Total colectomy 04/10/2022  Discharge Exam: Blood pressure 98/64, pulse 87, temperature 97.6 F (36.4 C), temperature  source Oral, resp. rate 18, weight 83 kg, SpO2 94 %. General appearance: alert and no distress Resp: normal work of breathing GI: soft, nondistended, minimally tender, staples c/di/ with no erythema or drainage  Disposition: Discharge disposition: 01-Home or Self Care       Discharge Instructions     Call MD for:  difficulty breathing, headache or visual disturbances   Complete by: As directed    Call MD for:  extreme fatigue   Complete by: As directed    Call MD for:  persistant dizziness or light-headedness   Complete by: As directed    Call MD for:  persistant nausea and vomiting   Complete by: As directed    Call MD for:  redness, tenderness, or signs of infection (pain, swelling, redness, odor or green/yellow discharge around incision site)   Complete by: As directed    Call MD for:  severe uncontrolled pain   Complete by: As directed    Call MD for:  temperature >100.4   Complete by: As directed    Increase activity slowly   Complete by: As directed       Allergies as of 04/13/2022       Reactions   Advair Diskus [fluticasone-salmeterol] Anaphylaxis   Other Hives   wool   Codeine Nausea And Vomiting  Penicillins Rash        Medication List     TAKE these medications    albuterol 108 (90 Base) MCG/ACT inhaler Commonly known as: VENTOLIN HFA Inhale 2 puffs into the lungs every 6 (six) hours as needed for wheezing or shortness of breath.   ARIPiprazole 10 MG tablet Commonly known as: ABILIFY Take 10 mg by mouth in the morning.   atorvastatin 20 MG tablet Commonly known as: LIPITOR Take 20 mg by mouth at bedtime.   buprenorphine 10 MCG/HR Ptwk Commonly known as: BUTRANS Place 1 patch onto the skin every Sunday.   buPROPion 150 MG 12 hr tablet Commonly known as: WELLBUTRIN SR Take 150 mg by mouth 2 (two) times daily.   busPIRone 15 MG tablet Commonly known as: BUSPAR Take 15 mg by mouth 3 (three) times daily.   cetirizine 10 MG  tablet Commonly known as: ZYRTEC Take 10 mg by mouth in the morning.   CINNAMON PO Take 2 tablets by mouth in the morning.   cyclobenzaprine 10 MG tablet Commonly known as: FLEXERIL Take 10 mg by mouth 3 (three) times daily as needed for muscle spasms.   Dulera 100-5 MCG/ACT Aero Generic drug: mometasone-formoterol Inhale 2 puffs into the lungs 2 (two) times daily.   famotidine 20 MG tablet Commonly known as: PEPCID Take 1 tablet (20 mg total) by mouth at bedtime.   fluvoxaMINE 100 MG tablet Commonly known as: LUVOX Take 150 mg by mouth 2 (two) times daily.   furosemide 20 MG tablet Commonly known as: LASIX Take 20 mg by mouth in the morning and at bedtime.   gabapentin 400 MG capsule Commonly known as: NEURONTIN Take 400 mg by mouth 3 (three) times daily.   GREEN TEA PO Take 1 tablet by mouth in the morning. MEGA-T Green Tea FAT BURNER   hydrOXYzine 50 MG tablet Commonly known as: ATARAX Take 50-100 mg by mouth at bedtime as needed (sleep).   lamoTRIgine 200 MG tablet Commonly known as: LAMICTAL Take 200 mg by mouth every evening.   loperamide 2 MG capsule Commonly known as: IMODIUM Take 1 capsule (2 mg total) by mouth 4 (four) times daily -  with meals and at bedtime.   lubiprostone 24 MCG capsule Commonly known as: AMITIZA TAKE 1 CAPSULE (24 MCG TOTAL) BY MOUTH 2 (TWO) TIMES DAILY WITH A MEAL.   metFORMIN 500 MG tablet Commonly known as: GLUCOPHAGE Take 500 mg by mouth 2 (two) times daily.   montelukast 10 MG tablet Commonly known as: SINGULAIR Take 10 mg by mouth at bedtime.   multivitamin with minerals Tabs tablet Take 1 tablet by mouth in the morning.   Myrbetriq 50 MG Tb24 tablet Generic drug: mirabegron ER Take 50 mg by mouth in the morning.   Omega 3 1200 MG Caps Take 1,200 mg by mouth in the morning.   ondansetron 4 MG tablet Commonly known as: ZOFRAN Take 1 tablet (4 mg total) by mouth every 6 (six) hours as needed for nausea.    oxyCODONE 5 MG immediate release tablet Commonly known as: Oxy IR/ROXICODONE Take 1 tablet (5 mg total) by mouth every 4 (four) hours as needed for severe pain or breakthrough pain. Do not use with your pain patch   oxymetazoline 0.05 % nasal spray Commonly known as: AFRIN Place 1 spray into both nostrils 2 (two) times daily as needed for congestion.   pantoprazole 40 MG tablet Commonly known as: Protonix Take 1 tablet (40 mg total) by  mouth 2 (two) times daily before a meal.   polyethylene glycol powder 17 GM/SCOOP powder Commonly known as: GLYCOLAX/MIRALAX Take 17 g by mouth in the morning. For constipation   senna-docusate 8.6-50 MG tablet Commonly known as: Senokot-S Take 2 tablets by mouth in the morning.   SINUS + HEADACHE PO Take 2 tablets by mouth daily as needed (sinus headaches).   SUMAtriptan 100 MG tablet Commonly known as: IMITREX Take 100 mg by mouth every 2 (two) hours as needed for migraine. *TAKE ONE TABLET BY MOUTH AT ONSET OF MIGRAINE. IF MIGRAINE PERSISTS AFTER 2 HOURS, TAKE ONE ADDITIONAL TABLET. *DO NOT EXCEED MORE THAN 2 TABLETS IN A 24 HOUR PERIOD AND DO NOT USE NO MORE THAN 5 TABLETS WITHIN THE PERIOD OF 1 WEEK*   topiramate 50 MG tablet Commonly known as: TOPAMAX Take 150 mg by mouth in the morning.   traZODone 50 MG tablet Commonly known as: DESYREL Take 50-150 mg by mouth at bedtime.   UNABLE TO FIND Apply topically as needed. Med Name: **HOME TENS UNIT: FOR LOWER CHRONIC BACK PAIN/BACK PROBLEMS   valACYclovir 500 MG tablet Commonly known as: VALTREX Take 500 mg by mouth 2 (two) times daily as needed (outbreaks).   vitamin C 1000 MG tablet Take 1,000 mg by mouth in the morning.        Follow-up Information     Virl Cagey, MD Follow up on 04/20/2022.   Specialty: General Surgery Why: staple removal Contact information: [address] Linna Hoff [facility] [address] [phone]                 Signed: Virl Cagey 04/13/2022, 10:57 AM

## 2022-04-13 NOTE — Discharge Instructions (Signed)
Instructions:  I notified Dr. Meredith Pel office about the Roxicodone. Do not use your pain patch while taking any of the roxicodone.   Total Colectomy You will have looser stools and have more frequent stools. You will need to use imodium '2mg'$  (1 tablet) to '4mg'$  (2 tablets) before meals and at bedtime to help control the amount of diarrhea/ stools you are having. Be sure to stay hydrated and drink plenty of liquids.  Common Complaints: Pain at the incision site is common. This will improve with time. Take your pain medications as described below. Some nausea is common and poor appetite. The main goal is to stay hydrated the first few days after surgery.   Diet/ Activity: Diet as tolerated. You have started and tolerated a diet in the hospital, and should continue to increase what you are able to eat.   You may not have a large appetite, but it is important to stay hydrated. Drink 64 ounces of water a day. Your appetite will return with time.  Keep a dry dressing in place over your staples daily or as needed. Some minor pink/ blood tinged drainage is expected. This will stop in a few days after surgery.  Shower per your regular routine daily.  Do not take hot showers. Take warm showers that are less than 10 minutes. Path the incision dry. Wear an abdominal binder daily with activity. You do not have to wear this while sleeping or sitting.  Rest and listen to your body, but do not remain in bed all day.  Walk everyday for at least 15-20 minutes. Deep cough and move around every 1-2 hours in the first few days after surgery.  Do not lift > 10 lbs, perform excessive bending, pushing, pulling, squatting for 6-8 weeks after surgery.  The activity restrictions and the abdominal binder are to prevent hernia formation at your incision while you are healing.  Do not place lotions or balms on your incision unless instructed to specifically by Dr. Constance Haw.   Pain Expectations and Narcotics: -After  surgery you will have pain associated with your incisions and this is normal. The pain is muscular and nerve pain, and will get better with time. -You are encouraged and expected to take non narcotic medications like tylenol and ibuprofen (when able) to treat pain as multiple modalities can aid with pain treatment. -Narcotics are only used when pain is severe or there is breakthrough pain. -You are not expected to have a pain score of 0 after surgery, as we cannot prevent pain. A pain score of 3-4 that allows you to be functional, move, walk, and tolerate some activity is the goal. The pain will continue to improve over the days after surgery and is dependent on your surgery. -Due to [place] law, we are only able to give a certain amount of pain medication to treat post operative pain, and we only give additional narcotics on a patient by patient basis.  -For most laparoscopic surgery, studies have shown that the majority of patients only need 10-15 narcotic pills, and for open surgeries most patients only need 15-20.   -Having appropriate expectations of pain and knowledge of pain management with non narcotics is important as we do not want anyone to become addicted to narcotic pain medication.  -Using ice packs in the first 48 hours and heating pads after 48 hours, wearing an abdominal binder (when recommended), and using over the counter medications are all ways to help with pain management.   -Simple  acts like meditation and mindfulness practices after surgery can also help with pain control and research has proven the benefit of these practices.  Medication: Take tylenol and ibuprofen as needed for pain control, alternating every 4-6 hours.  Example:  Tylenol '1000mg'$  @ 6am, 12noon, 6pm, 43mdnight (Do not exceed '4000mg'$  of tylenol a day). Ibuprofen '800mg'$  @ 9am, 3pm, 9pm, 3am (Do not exceed '3600mg'$  of ibuprofen a day).  Take Roxicodone for breakthrough pain every 4 hours.    Contact Information: If you  have questions or concerns, please call our office, [phone] Monday- Thursday 8AM-5PM and Friday 8AM-12Noon.  If it is after hours or on the weekend, please call [facility] Main Number, [phone] and ask to speak to the surgeon on call for Dr. [facility]

## 2022-04-14 ENCOUNTER — Encounter (HOSPITAL_COMMUNITY): Payer: Self-pay | Admitting: General Surgery

## 2022-04-15 ENCOUNTER — Emergency Department (HOSPITAL_COMMUNITY): Payer: 59

## 2022-04-15 ENCOUNTER — Inpatient Hospital Stay (HOSPITAL_COMMUNITY)
Admission: EM | Admit: 2022-04-15 | Discharge: 2022-04-21 | DRG: 394 | Disposition: A | Discharge status: Home Health | Payer: 59 | Attending: General Surgery | Admitting: General Surgery

## 2022-04-15 DIAGNOSIS — G8929 Other chronic pain: Secondary | ICD-10-CM | POA: Diagnosis not present

## 2022-04-15 DIAGNOSIS — E1165 Type 2 diabetes mellitus with hyperglycemia: Secondary | ICD-10-CM | POA: Diagnosis not present

## 2022-04-15 DIAGNOSIS — M5030 Other cervical disc degeneration, unspecified cervical region: Secondary | ICD-10-CM | POA: Diagnosis present

## 2022-04-15 DIAGNOSIS — D75838 Other thrombocytosis: Secondary | ICD-10-CM | POA: Diagnosis not present

## 2022-04-15 DIAGNOSIS — R112 Nausea with vomiting, unspecified: Secondary | ICD-10-CM | POA: Diagnosis not present

## 2022-04-15 DIAGNOSIS — K219 Gastro-esophageal reflux disease without esophagitis: Secondary | ICD-10-CM | POA: Diagnosis present

## 2022-04-15 DIAGNOSIS — D72829 Elevated white blood cell count, unspecified: Secondary | ICD-10-CM

## 2022-04-15 DIAGNOSIS — I700 Atherosclerosis of aorta: Secondary | ICD-10-CM | POA: Diagnosis not present

## 2022-04-15 DIAGNOSIS — E871 Hypo-osmolality and hyponatremia: Secondary | ICD-10-CM | POA: Diagnosis present

## 2022-04-15 DIAGNOSIS — J449 Chronic obstructive pulmonary disease, unspecified: Secondary | ICD-10-CM | POA: Insufficient documentation

## 2022-04-15 DIAGNOSIS — R109 Unspecified abdominal pain: Secondary | ICD-10-CM

## 2022-04-15 DIAGNOSIS — R000 Tachycardia, unspecified: Secondary | ICD-10-CM | POA: Diagnosis not present

## 2022-04-15 DIAGNOSIS — Z888 Allergy status to other drugs, medicaments and biological substances status: Secondary | ICD-10-CM

## 2022-04-15 DIAGNOSIS — Z880 Allergy status to penicillin: Secondary | ICD-10-CM

## 2022-04-15 DIAGNOSIS — E876 Hypokalemia: Secondary | ICD-10-CM | POA: Diagnosis not present

## 2022-04-15 DIAGNOSIS — Z885 Allergy status to narcotic agent status: Secondary | ICD-10-CM

## 2022-04-15 DIAGNOSIS — F429 Obsessive-compulsive disorder, unspecified: Secondary | ICD-10-CM | POA: Diagnosis present

## 2022-04-15 DIAGNOSIS — K9189 Other postprocedural complications and disorders of digestive system: Principal | ICD-10-CM | POA: Diagnosis present

## 2022-04-15 DIAGNOSIS — R1110 Vomiting, unspecified: Secondary | ICD-10-CM | POA: Diagnosis not present

## 2022-04-15 DIAGNOSIS — E782 Mixed hyperlipidemia: Secondary | ICD-10-CM | POA: Diagnosis present

## 2022-04-15 DIAGNOSIS — J4489 Other specified chronic obstructive pulmonary disease: Secondary | ICD-10-CM | POA: Diagnosis not present

## 2022-04-15 DIAGNOSIS — E860 Dehydration: Secondary | ICD-10-CM | POA: Diagnosis not present

## 2022-04-15 DIAGNOSIS — Y733 Surgical instruments, materials and gastroenterology and urology devices (including sutures) associated with adverse incidents: Secondary | ICD-10-CM | POA: Diagnosis present

## 2022-04-15 DIAGNOSIS — Y838 Other surgical procedures as the cause of abnormal reaction of the patient, or of later complication, without mention of misadventure at the time of the procedure: Secondary | ICD-10-CM | POA: Diagnosis present

## 2022-04-15 DIAGNOSIS — M797 Fibromyalgia: Secondary | ICD-10-CM | POA: Diagnosis not present

## 2022-04-15 DIAGNOSIS — K560 Paralytic ileus: Secondary | ICD-10-CM | POA: Diagnosis not present

## 2022-04-15 DIAGNOSIS — F419 Anxiety disorder, unspecified: Secondary | ICD-10-CM

## 2022-04-15 DIAGNOSIS — Z1152 Encounter for screening for COVID-19: Secondary | ICD-10-CM | POA: Diagnosis not present

## 2022-04-15 DIAGNOSIS — D75839 Thrombocytosis, unspecified: Secondary | ICD-10-CM

## 2022-04-15 DIAGNOSIS — G2581 Restless legs syndrome: Secondary | ICD-10-CM | POA: Diagnosis present

## 2022-04-15 DIAGNOSIS — G629 Polyneuropathy, unspecified: Secondary | ICD-10-CM | POA: Diagnosis present

## 2022-04-15 DIAGNOSIS — F17210 Nicotine dependence, cigarettes, uncomplicated: Secondary | ICD-10-CM | POA: Diagnosis not present

## 2022-04-15 DIAGNOSIS — K567 Ileus, unspecified: Secondary | ICD-10-CM | POA: Diagnosis not present

## 2022-04-15 DIAGNOSIS — M1990 Unspecified osteoarthritis, unspecified site: Secondary | ICD-10-CM | POA: Diagnosis not present

## 2022-04-15 DIAGNOSIS — F32A Depression, unspecified: Secondary | ICD-10-CM | POA: Insufficient documentation

## 2022-04-15 DIAGNOSIS — R9431 Abnormal electrocardiogram [ECG] [EKG]: Secondary | ICD-10-CM | POA: Diagnosis not present

## 2022-04-15 DIAGNOSIS — T476X5A Adverse effect of antidiarrheal drugs, initial encounter: Secondary | ICD-10-CM | POA: Diagnosis present

## 2022-04-15 DIAGNOSIS — Z7984 Long term (current) use of oral hypoglycemic drugs: Secondary | ICD-10-CM

## 2022-04-15 DIAGNOSIS — E669 Obesity, unspecified: Secondary | ICD-10-CM | POA: Diagnosis present

## 2022-04-15 DIAGNOSIS — Z6831 Body mass index (BMI) 31.0-31.9, adult: Secondary | ICD-10-CM | POA: Diagnosis not present

## 2022-04-15 DIAGNOSIS — Z7951 Long term (current) use of inhaled steroids: Secondary | ICD-10-CM

## 2022-04-15 DIAGNOSIS — Z79899 Other long term (current) drug therapy: Secondary | ICD-10-CM

## 2022-04-15 DIAGNOSIS — G4730 Sleep apnea, unspecified: Secondary | ICD-10-CM | POA: Diagnosis not present

## 2022-04-15 DIAGNOSIS — F4310 Post-traumatic stress disorder, unspecified: Secondary | ICD-10-CM | POA: Diagnosis present

## 2022-04-15 LAB — COMPREHENSIVE METABOLIC PANEL
ALT: 26 U/L (ref 0–44)
AST: 18 U/L (ref 15–41)
Albumin: 3.8 g/dL (ref 3.5–5.0)
Alkaline Phosphatase: 89 U/L (ref 38–126)
Anion gap: 15 (ref 5–15)
BUN: 35 mg/dL — ABNORMAL HIGH (ref 6–20)
CO2: 25 mmol/L (ref 22–32)
Calcium: 9.1 mg/dL (ref 8.9–10.3)
Chloride: 88 mmol/L — ABNORMAL LOW (ref 98–111)
Creatinine, Ser: 1.05 mg/dL — ABNORMAL HIGH (ref 0.44–1.00)
GFR, Estimated: >60 mL/min (ref >60)
Glucose, Bld: 156 mg/dL — ABNORMAL HIGH (ref 70–99)
Potassium: 3.9 mmol/L (ref 3.5–5.1)
Sodium: 128 mmol/L — ABNORMAL LOW (ref 135–145)
Total Bilirubin: 0.6 mg/dL (ref 0.3–1.2)
Total Protein: 8.3 g/dL — ABNORMAL HIGH (ref 6.5–8.1)

## 2022-04-15 LAB — RESP PANEL BY RT-PCR (RSV, FLU A&B, COVID)  RVPGX2
Influenza A by PCR: NEGATIVE
Influenza B by PCR: NEGATIVE
Resp Syncytial Virus by PCR: NEGATIVE
SARS Coronavirus 2 by RT PCR: NEGATIVE

## 2022-04-15 LAB — CBC
HCT: 43.7 % (ref 36.0–46.0)
Hemoglobin: 15.4 g/dL — ABNORMAL HIGH (ref 12.0–15.0)
MCH: 30.9 pg (ref 26.0–34.0)
MCHC: 35.2 g/dL (ref 30.0–36.0)
MCV: 87.8 fL (ref 80.0–100.0)
Platelets: 407 10*3/uL — ABNORMAL HIGH (ref 150–400)
RBC: 4.98 MIL/uL (ref 3.87–5.11)
RDW: 13.6 % (ref 11.5–15.5)
WBC: 11.9 10*3/uL — ABNORMAL HIGH (ref 4.0–10.5)
nRBC: 0 % (ref 0.0–0.2)

## 2022-04-15 LAB — LACTIC ACID, PLASMA
Lactic Acid, Venous: 1.8 mmol/L (ref 0.5–1.9)
Lactic Acid, Venous: 1.4 mmol/L (ref 0.5–1.9)

## 2022-04-15 LAB — CBG MONITORING, ED: Glucose-Capillary: 157 mg/dL — ABNORMAL HIGH (ref 70–99)

## 2022-04-15 MED ORDER — MONTELUKAST SODIUM 10 MG PO TABS
10.0000 mg | ORAL_TABLET | Freq: Every day | ORAL | Status: DC
  Administered 2022-04-15 – 2022-04-20 (×6): 10 mg via ORAL
  Filled 2022-04-15 (×6): qty 1

## 2022-04-15 MED ORDER — ALBUTEROL SULFATE HFA 108 (90 BASE) MCG/ACT IN AERS: 2.0000 | INHALATION_SPRAY | Freq: Four times a day (QID) | RESPIRATORY_TRACT | Status: DC | PRN

## 2022-04-15 MED ORDER — HYDROMORPHONE HCL 1 MG/ML IJ SOLN
0.5000 mg | INTRAMUSCULAR | Status: DC | PRN
  Administered 2022-04-15 – 2022-04-19 (×23): 0.5 mg via INTRAVENOUS
  Filled 2022-04-15 (×24): qty 0.5

## 2022-04-15 MED ORDER — SODIUM CHLORIDE 0.9 % IV SOLN: INTRAVENOUS | Status: DC

## 2022-04-15 MED ORDER — SODIUM CHLORIDE 0.9 % IV SOLN: INTRAVENOUS | Status: AC

## 2022-04-15 MED ORDER — PROCHLORPERAZINE EDISYLATE 10 MG/2ML IJ SOLN
10.0000 mg | Freq: Four times a day (QID) | INTRAMUSCULAR | Status: DC | PRN
  Administered 2022-04-15 – 2022-04-19 (×4): 10 mg via INTRAVENOUS
  Filled 2022-04-15 (×4): qty 2

## 2022-04-15 MED ORDER — INSULIN ASPART 100 UNIT/ML IJ SOLN
0.0000 [IU] | INTRAMUSCULAR | Status: DC
  Administered 2022-04-15: 3 [IU] via SUBCUTANEOUS
  Administered 2022-04-16 (×3): 2 [IU] via SUBCUTANEOUS
  Administered 2022-04-16: 3 [IU] via SUBCUTANEOUS
  Administered 2022-04-16: 2 [IU] via SUBCUTANEOUS
  Administered 2022-04-16: 3 [IU] via SUBCUTANEOUS
  Administered 2022-04-17: 2 [IU] via SUBCUTANEOUS
  Administered 2022-04-17: 3 [IU] via SUBCUTANEOUS
  Administered 2022-04-18 – 2022-04-20 (×3): 2 [IU] via SUBCUTANEOUS
  Filled 2022-04-15 (×4): qty 1

## 2022-04-15 MED ORDER — PANTOPRAZOLE SODIUM 40 MG IV SOLR
40.0000 mg | Freq: Once | INTRAVENOUS | Status: AC
  Administered 2022-04-15: 40 mg via INTRAVENOUS
  Filled 2022-04-15: qty 10

## 2022-04-15 MED ORDER — ONDANSETRON HCL 4 MG/2ML IJ SOLN
4.0000 mg | Freq: Once | INTRAMUSCULAR | Status: AC
  Administered 2022-04-15: 4 mg via INTRAVENOUS
  Filled 2022-04-15: qty 2

## 2022-04-15 MED ORDER — PANTOPRAZOLE SODIUM 40 MG IV SOLR
40.0000 mg | INTRAVENOUS | Status: DC
  Administered 2022-04-15 – 2022-04-17 (×3): 40 mg via INTRAVENOUS
  Filled 2022-04-15 (×3): qty 10

## 2022-04-15 MED ORDER — IOHEXOL 9 MG/ML PO SOLN
500.0000 mL | ORAL | Status: AC
  Administered 2022-04-15 (×2): 500 mL via ORAL

## 2022-04-15 MED ORDER — ALBUTEROL SULFATE (2.5 MG/3ML) 0.083% IN NEBU: 2.5000 mg | INHALATION_SOLUTION | Freq: Four times a day (QID) | RESPIRATORY_TRACT | Status: DC | PRN

## 2022-04-15 MED ORDER — IOHEXOL 300 MG/ML  SOLN
100.0000 mL | Freq: Once | INTRAMUSCULAR | Status: AC | PRN
  Administered 2022-04-15: 100 mL via INTRAVENOUS

## 2022-04-15 MED ORDER — LACTATED RINGERS IV BOLUS
1000.0000 mL | Freq: Once | INTRAVENOUS | Status: AC
  Administered 2022-04-15: 1000 mL via INTRAVENOUS

## 2022-04-15 NOTE — H&P (Signed)
History and Physical    Patient: Alexis Harris HGD:[id number] DOB: 07/14/1970 DOA: 04/15/2022 DOS: the patient was seen and examined on 04/15/2022 PCP: Carrolyn Meiers, MD  Patient coming from: Home  Chief Complaint:  Chief Complaint  Patient presents with   Post-op Problem   Emesis   HPI: Alexis Harris is a 52 y.o. female with medical history significant of hyperlipidemia, GERD, COPD, chronic pain, anxiety and depression who presents to the emergency department due to nausea, vomiting and abdominal pain which started yesterday in the morning (04/14/2022).  Patient underwent total colectomy and side-to-side anastomosis with ileum and rectosigmoid junction on 04/10/2022 due to multiple adenomatous polyps throughout the colon.  She did well after the surgery and was able to eat, drink, ambulate and had pain control with oral pain meds.  Patient was having bowel movements and was taking Imodium to control the number and character of the BMs prior to discharge.  She denies chest pain, shortness of breath, fever, chills, headache, blurry vision.  ED Course:  In the emergency department, patient was tachycardic, with total vital signs were within normal range.  Workup in the ED showed CBC with WBC 11.9, H/H15.4/43.7, platelets 407, MCV 87.8.  BMP showed sodium 128, potassium 3.9, chloride 88, bicarb 25, blood glucose 156, BUN 35, creatinine 1.05, lactic acid x 2 was normal.  Influenza A, B, SARS coronavirus 2, RSV was negative, blood culture was pending. CT abdomen and pelvis with contrast showed: 1. Interval near-total colectomy with intact appearing ileocolic anastomosis. Generalized mild to moderate small bowel dilatation, most compatible with postoperative adynamic ileus. 2. New 6.4 x 3.7 x 3.4 cm extracapsular fluid collection between the right liver and posterior right hemidiaphragm with associated scalloping of the liver capsule, cannot exclude a postoperative abscess. 3. Trace ascitic  fluid. Small amount of scattered retroperitoneal and intraperitoneal gas in the lateral aspects of the bilateral abdomen. Small amount of scattered subcutaneous midline ventral abdominal wall gas. Findings are presumably due to recent surgery. 4. Cholelithiasis.  No biliary ductal dilatation. 5. Prominently distended and otherwise normal bladder. No hydronephrosis. 6.  Aortic Atherosclerosis   General Surgery (Dr. Arnoldo Morale) was consulted and recommended IV hydration and antiemetics without any antibiotics for patient to be admitted with plan to consult on patient in the morning.  IV hydration was provided, Zofran and Protonix were given.  Hospitalist was asked to admit patient for further evaluation and management.    Review of Systems: Review of systems as noted in the HPI. All other systems reviewed and are negative.   Past Medical History:  Diagnosis Date   Anxiety    Arthritis    Asthma    Back pain    Carpal tunnel syndrome    COPD (chronic obstructive pulmonary disease) (HCC)    Degenerative cervical disc    Depression    Diabetes mellitus without complication (HCC)    DJD (degenerative joint disease), lumbosacral    Fibromyalgia    GERD (gastroesophageal reflux disease)    Hypersomnia    IBS (irritable bowel syndrome)    Interstitial cystitis    Migraine    Migraines    Neuropathy    OCD (obsessive compulsive disorder)    PONV (postoperative nausea and vomiting)    PTSD (post-traumatic stress disorder)    Restless leg syndrome    Sleep apnea    Past Surgical History:  Procedure Laterality Date   ABDOMINAL HYSTERECTOMY     ABDOMINAL SURGERY  BALLOON DILATION N/A 04/18/2021   Procedure: BALLOON DILATION;  Surgeon: Eloise Harman, DO;  [facility]: [facility];  Service: Endoscopy;  Laterality: N/A;   BIOPSY  04/18/2021   Procedure: BIOPSY;  Surgeon: Eloise Harman, DO;  Location: [facility];  Service: Endoscopy;;   CATARACT EXTRACTION W/PHACO Right  11/10/2014   Procedure: CATARACT EXTRACTION PHACO AND INTRAOCULAR LENS PLACEMENT (IOC);  Surgeon: Rutherford Guys, MD;  Location: [facility];  Service: Ophthalmology;  Laterality: Right;  CDE 2.04   CATARACT EXTRACTION W/PHACO Left 11/24/2014   Procedure: CATARACT EXTRACTION PHACO AND INTRAOCULAR LENS PLACEMENT (IOC);  Surgeon: Rutherford Guys, MD;  Location: [facility];  Service: Ophthalmology;  Laterality: Left;  CDE:1.04   COLONOSCOPY WITH PROPOFOL N/A 01/30/2022   Procedure: COLONOSCOPY WITH PROPOFOL;  Surgeon: Eloise Harman, DO;  [facility]: [facility];  Service: Endoscopy;  Laterality: N/A;  11:30am, asa 3   ESOPHAGOGASTRODUODENOSCOPY  09/2015   Dr. Mitchell Heir, [place]: esopahgeal lumen appears dilated and diffusely coated with old food. s/p brushing/bx and dilation. Benign squamous mucosal showing surface mucosal necrosis and focal hyperkeratosis. no fungal elements or EOE   ESOPHAGOGASTRODUODENOSCOPY (EGD) WITH PROPOFOL N/A 04/18/2021   Procedure: ESOPHAGOGASTRODUODENOSCOPY (EGD) WITH PROPOFOL;  Surgeon: Eloise Harman, DO;  [facility]: [facility];  Service: Endoscopy;  Laterality: N/A;   EXPLORATORY LAPAROTOMY     x3   FLEXIBLE SIGMOIDOSCOPY  04/18/2021   Procedure: FLEXIBLE SIGMOIDOSCOPY;  Surgeon: Eloise Harman, DO;  [facility]: [facility];  Service: Endoscopy;;   interstim medical device implanted     in lower back-to control bladder   MOUTH SURGERY     PARTIAL COLECTOMY N/A 04/10/2022   Procedure: TOTAL COLECTOMY;  Surgeon: Virl Cagey, MD;  Location: [facility];  Service: General;  Laterality: N/A;   SUBMUCOSAL TATTOO INJECTION  01/30/2022   Procedure: SUBMUCOSAL TATTOO INJECTION;  Surgeon: Eloise Harman, DO;  Location: [facility];  Service: Endoscopy;;    Social History:  reports that she has been smoking cigarettes. She has a 12.50 pack-year smoking history. She has never used smokeless tobacco. She reports current alcohol use. She reports that she does  not use drugs.   Allergies  Allergen Reactions   Advair Diskus [Fluticasone-Salmeterol] Anaphylaxis   Other Hives    wool   Codeine Nausea And Vomiting   Penicillins Rash    Family History  Adopted: Yes     Prior to Admission medications   Medication Sig Start Date End Date Taking? Authorizing Provider  albuterol (VENTOLIN HFA) 108 (90 Base) MCG/ACT inhaler Inhale 2 puffs into the lungs every 6 (six) hours as needed for wheezing or shortness of breath.   Yes [provider]  ARIPiprazole (ABILIFY) 10 MG tablet Take 10 mg by mouth in the morning.   Yes [provider]  Ascorbic Acid (VITAMIN C) 1000 MG tablet Take 1,000 mg by mouth in the morning.   Yes [provider]  atorvastatin (LIPITOR) 20 MG tablet Take 20 mg by mouth at bedtime.   Yes [provider]  buPROPion (WELLBUTRIN SR) 150 MG 12 hr tablet Take 150 mg by mouth 2 (two) times daily. 12/26/21  Yes [provider]  busPIRone (BUSPAR) 15 MG tablet Take 15 mg by mouth 3 (three) times daily.   Yes [provider]  cetirizine (ZYRTEC) 10 MG tablet Take 10 mg by mouth in the morning.   Yes [provider]  CINNAMON PO Take 2  tablets by mouth in the morning.   Yes [provider]  cyclobenzaprine (FLEXERIL) 10 MG tablet Take 10 mg by mouth 3 (three) times daily as needed for muscle spasms.   Yes [provider]  DULERA 100-5 MCG/ACT AERO Inhale 2 puffs into the lungs 2 (two) times daily. 11/06/14  Yes [provider]  estradiol (ESTRACE) 0.1 MG/GM vaginal cream Place 1 g vaginally 3 (three) times a week. 04/01/22  Yes [provider]  famotidine (PEPCID) 20 MG tablet Take 1 tablet (20 mg total) by mouth at bedtime. 09/12/21  Yes Mahon, Courtney L, NP  fluvoxaMINE (LUVOX) 100 MG tablet Take 150 mg by mouth 2 (two) times daily.   Yes [provider]  furosemide (LASIX) 20 MG tablet Take 20 mg by mouth in the morning and at bedtime.  06/28/21  Yes [provider]  gabapentin (NEURONTIN) 400 MG capsule Take 400 mg by mouth 3 (three) times daily.   Yes [provider]  Eloise Levels, Camillia sinensis, (GREEN TEA PO) Take 1 tablet by mouth in the morning. MEGA-T Green Tea FAT BURNER   Yes [provider]  hydrOXYzine (ATARAX) 50 MG tablet Take 50-100 mg by mouth at bedtime as needed (sleep).   Yes [provider]  lamoTRIgine (LAMICTAL) 200 MG tablet Take 200 mg by mouth every evening.   Yes [provider]  loperamide (IMODIUM) 2 MG capsule Take 1 capsule (2 mg total) by mouth 4 (four) times daily -  with meals and at bedtime. 04/13/22  Yes Virl Cagey, MD  lubiprostone (AMITIZA) 24 MCG capsule TAKE 1 CAPSULE (24 MCG TOTAL) BY MOUTH 2 (TWO) TIMES DAILY WITH A MEAL. 12/12/21  Yes Mahon, Lenise Arena, NP  metFORMIN (GLUCOPHAGE) 500 MG tablet Take 500 mg by mouth 2 (two) times daily. 08/06/20  Yes [provider]  montelukast (SINGULAIR) 10 MG tablet Take 10 mg by mouth at bedtime.   Yes [provider]  Multiple Vitamin (MULTIVITAMIN WITH MINERALS) TABS tablet Take 1 tablet by mouth in the morning.   Yes [provider]  MYRBETRIQ 50 MG TB24 tablet Take 50 mg by mouth in the morning. 07/19/20  Yes [provider]  OMEGA 3 1200 MG CAPS Take 1,200 mg by mouth in the morning.   Yes [provider]  ondansetron (ZOFRAN) 4 MG tablet Take 1 tablet (4 mg total) by mouth every 6 (six) hours as needed for nausea. 04/13/22  Yes Virl Cagey, MD  oxyCODONE (OXY IR/ROXICODONE) 5 MG immediate release tablet Take 1 tablet (5 mg total) by mouth every 4 (four) hours as needed for severe pain or breakthrough pain. Do not use with your pain patch 04/13/22  Yes Virl Cagey, MD  oxymetazoline (AFRIN) 0.05 % nasal spray Place 1 spray into both nostrils 2 (two) times daily as needed for congestion.   Yes [provider]  pantoprazole (PROTONIX) 40 MG  tablet Take 1 tablet (40 mg total) by mouth 2 (two) times daily before a meal. 01/03/22 01/03/23 Yes Mahon, Lenise Arena, NP  Phenylephrine-Acetaminophen (SINUS + HEADACHE PO) Take 2 tablets by mouth daily as needed (sinus headaches).   Yes [provider]  SUMAtriptan (IMITREX) 100 MG tablet Take 100 mg by mouth every 2 (two) hours as needed for migraine. *TAKE ONE TABLET BY MOUTH AT ONSET OF MIGRAINE. IF MIGRAINE PERSISTS AFTER 2 HOURS, TAKE ONE ADDITIONAL TABLET. *DO NOT EXCEED MORE THAN 2 TABLETS IN A 24 HOUR PERIOD  AND DO NOT USE NO MORE THAN 5 TABLETS WITHIN THE PERIOD OF 1 WEEK*   Yes [provider]  topiramate (TOPAMAX) 50 MG tablet Take 150 mg by mouth in the morning. 07/25/20  Yes [provider]  traZODone (DESYREL) 50 MG tablet Take 50-150 mg by mouth at bedtime.   Yes [provider]  UNABLE TO FIND Apply topically as needed. Med Name: **HOME TENS UNIT: FOR LOWER CHRONIC BACK PAIN/BACK PROBLEMS    Yes [provider]  valACYclovir (VALTREX) 500 MG tablet Take 500 mg by mouth 2 (two) times daily as needed (outbreaks).   Yes [provider]  buprenorphine (BUTRANS) 10 MCG/HR [place] 1 patch onto the skin every Sunday.    [provider]  polyethylene glycol powder (GLYCOLAX/MIRALAX) powder Take 17 g by mouth in the morning. For constipation 01/30/15   [provider]  senna-docusate (SENOKOT-S) 8.6-50 MG tablet Take 2 tablets by mouth in the morning.    [provider]    Physical Exam: BP (!) 142/103   Pulse (!) 118   Temp 98.4 F (36.9 C) (Oral)   Resp (!) 24   Ht '5\' 4"'$  (1.626 m)   Wt 83 kg   SpO2 95%   BMI 31.41 kg/m   General: 52 y.o. year-old female well developed well nourished in no acute distress.  Alert and oriented x3. HEENT: NCAT, EOMI Neck: Supple, trachea medial Cardiovascular: Regular rate and rhythm with no rubs or gallops.  No thyromegaly or JVD noted.  No lower extremity edema. 2/4  pulses in all 4 extremities. Respiratory: Clear to auscultation with no wheezes or rales. Good inspiratory effort. Abdomen: Soft, nontender nondistended with normal bowel sounds x4 quadrants. Muskuloskeletal: No cyanosis, clubbing or edema noted bilaterally Neuro: CN II-XII intact, strength 5/5 x 4, sensation, reflexes intact Skin: No ulcerative lesions noted or rashes Psychiatry: Judgement and insight appear normal. Mood is appropriate for condition and setting          Labs on Admission:  Basic Metabolic Panel: Recent Labs  Lab 04/11/22 0319 04/12/22 0310 04/13/22 0405 04/15/22 1314  NA 137 136 137 128*  K 3.4* 3.0* 3.8 3.9  CL 107 103 102 88*  CO2 21* '22 25 25  '$ GLUCOSE 129* 138* 130* 156*  BUN '10 8 6 '$ 35*  CREATININE 0.71 0.77 0.84 1.05*  CALCIUM 8.6* 8.4* 8.5* 9.1  MG 1.9 1.9  --   --   PHOS 3.1 3.4  --   --    Liver Function Tests: Recent Labs  Lab 04/15/22 1314  AST 18  ALT 26  ALKPHOS 89  BILITOT 0.6  PROT 8.3*  ALBUMIN 3.8   No results for input(s): "LIPASE", "AMYLASE" in the last 168 hours. No results for input(s): "AMMONIA" in the last 168 hours. CBC: Recent Labs  Lab 04/11/22 0319 04/12/22 0310 04/13/22 0405 04/15/22 1314  WBC 9.6 7.4 7.9 11.9*  NEUTROABS 7.2 4.9  --   --   HGB 11.7* 11.7* 11.6* 15.4*  HCT 35.3* 35.4* 35.1* 43.7  MCV 94.9 94.7 94.6 87.8  PLT 248 220 229 407*   Cardiac Enzymes: No results for input(s): "CKTOTAL", "CKMB", "CKMBINDEX", "TROPONINI" in the last 168 hours.  BNP (last 3 results) No results for input(s): "BNP" in the last 8760 hours.  ProBNP (last 3 results) No results for input(s): "PROBNP" in the last 8760 hours.  CBG: Recent Labs  Lab 04/12/22 1128 04/12/22 1704 04/12/22 2135 04/13/22 0735 04/13/22 1141  GLUCAP 130* 163* 195* 136* 130*    Radiological Exams on Admission: CT Abdomen Pelvis W Contrast  Result Date: 04/15/2022 CLINICAL DATA:  Total colectomy with ileorectal anastomosis 04/10/2022 for  colonic polyps. Patient presents with abdominal pain, nausea and vomiting for 1 day. EXAM: CT ABDOMEN AND PELVIS WITH CONTRAST TECHNIQUE: Multidetector CT imaging of the abdomen and pelvis was performed using the standard protocol following bolus administration of intravenous contrast. RADIATION DOSE REDUCTION: This exam was performed according to the departmental dose-optimization program which includes automated exposure control, adjustment of the mA and/or kV according to patient size and/or use of iterative reconstruction technique. CONTRAST:  145m OMNIPAQUE IOHEXOL 300 MG/ML  SOLN COMPARISON:  07/05/2012 CT abdomen/pelvis. FINDINGS: Lower chest: Mild hypoventilatory changes at both lung bases. Hepatobiliary: Normal liver size. No liver mass. There is a new 6.4 x 3.7 x 3.4 cm extracapsular fluid collection between the right liver and posterior right hemidiaphragm with associated scalloping of the liver capsule (series 2/image 21). Cholelithiasis. No definite gallbladder wall thickening. Pancreas: Normal, with no mass or duct dilation. Spleen: Normal size. No mass. Adrenals/Urinary Tract: Normal adrenals. Scattered subcentimeter hypodense left renal cortical lesions are too small to characterize, for which no follow-up imaging is recommended. Otherwise normal kidneys, with no hydronephrosis. Prominently distended and otherwise normal bladder. Stomach/Bowel: Mild-to-moderate gastric distention with fluid and debris filled stomach. No gastric wall thickening. Interval near total colectomy with intact appearing ileocolic anastomosis in the lower abdomen to the right of midline. Generalized mild to moderate small bowel dilatation up to 5.2 cm diameter in the jejunum. No small bowel wall thickening or focal small bowel caliber transition. Unremarkable nondistended rectum. Vascular/Lymphatic: Atherosclerotic nonaneurysmal abdominal aorta. Patent portal, splenic, hepatic and renal veins. No pathologically enlarged  lymph nodes in the abdomen or pelvis. Reproductive: Status post hysterectomy, with no abnormal findings at the vaginal cuff. No adnexal mass. Other: Trace ascitic fluid. Small amount of scattered retroperitoneal and intraperitoneal gas in lateral aspects of the bilateral abdomen. Small amount of scattered subcutaneous midline ventral abdominal wall gas. No additional focal fluid collections. Subcutaneous stimulator device in right buttock with single lead traversing right sacrum. Musculoskeletal: No aggressive appearing focal osseous lesions. Marked lower lumbar degenerative disc disease. IMPRESSION: 1. Interval near-total colectomy with intact appearing ileocolic anastomosis. Generalized mild to moderate small bowel dilatation, most compatible with postoperative adynamic ileus. 2. New 6.4 x 3.7 x 3.4 cm extracapsular fluid collection between the right liver and posterior right hemidiaphragm with associated scalloping of the liver capsule, cannot exclude a postoperative abscess. 3. Trace ascitic fluid. Small amount of scattered retroperitoneal and intraperitoneal gas in the lateral aspects of the bilateral abdomen. Small amount of scattered subcutaneous midline ventral abdominal wall gas. Findings are presumably due to recent surgery. 4. Cholelithiasis.  No biliary ductal dilatation. 5. Prominently distended and otherwise normal bladder. No hydronephrosis. 6.  Aortic Atherosclerosis (ICD10-I70.0). Electronically Signed   By: JIlona SorrelM.D.   On: 04/15/2022 16:54    EKG: I independently viewed the EKG done and my findings are as followed: Sinus tachycardia at a rate of 124 bpm with QTc 505 ms  Assessment/Plan Present on Admission:  Adynamic ileus (HCC)  GERD (gastroesophageal reflux disease)  Principal Problem:   Adynamic ileus ([facility] Active Problems:   GERD (gastroesophageal reflux disease)   Nausea & vomiting   Abdominal pain   Leukocytosis   Thrombocytosis   Type 2 diabetes mellitus with  hyperglycemia (HCC)   Prolonged QT interval   Mixed hyperlipidemia  COPD (chronic obstructive pulmonary disease) (HCC)   Anxiety and depression   Nausea, vomiting and abdominal pain in the setting of postoperative adynamic ileus Continue IV Compazine as needed Continue IV Dilaudid 0.5 mg every 3 hours as needed for moderate/severe. Patient will be temporarily placed n.p.o. pending surgical consult in the morning Continue IV hydration General surgery already consulted and will follow-up with patient in the morning  Hyponatremia possibly secondary to dehydration This is possibly secondary to patient's vomiting and several loose bowel movements requiring Imodium after the surgery Sodium 128, continue IV hydration Continue to monitor sodium levels with serial BMPs  Leukocytosis, thrombocytosis possibly secondary to hemoconcentration Continue IV hydration  Type 2 diabetes mellitus with hyperglycemia Continue ISS and hypoglycemic protocol Metformin will be held at this time  Prolonged QT interval QTc 55m Avoid QT prolonging drugs Magnesium level will be checked Repeat EKG in the morning  Mixed hyperlipidemia Continue statin when patient resumes oral intake  COPD Continue Singulair  GERD Continue Protonix  Anxiety and depression Consider continuing patient's home meds when patient resumes oral intake  DVT prophylaxis: SCDs (consider starting chemoprophylaxis if no indication for surgical procedure in the morning)  Code Status: Full code  Family Communication: None at bedside  Consults: General surgery (by EDP)  Severity of Illness: The appropriate patient status for this patient is INPATIENT. Inpatient status is judged to be reasonable and necessary in order to provide the required intensity of service to ensure the patient's safety. The patient's presenting symptoms, physical exam findings, and initial radiographic and laboratory data in the context of their chronic  comorbidities is felt to place them at high risk for further clinical deterioration. Furthermore, it is not anticipated that the patient will be medically stable for discharge from the hospital within 2 midnights of admission.   * I certify that at the point of admission it is my clinical judgment that the patient will require inpatient hospital care spanning beyond 2 midnights from the point of admission due to high intensity of service, high risk for further deterioration and high frequency of surveillance required.*  Author: OBernadette Hoit DO 04/15/2022 9:11 PM  For on call review [email or web address]

## 2022-04-15 NOTE — ED Provider Notes (Signed)
Care assumed from Dr. Melina Copa at change of shift.  This patient presented with nausea and vomiting and abdominal discomfort, discharged 2 days ago after having total colectomy, CT scan confirms an adynamic ileus with a fluid collection above the liver.  I discussed the care with Dr. Arnoldo Morale who request that the patient be admitted here to the hospital service, hold antibiotics, supportive care with IV fluids and antiemetics and he will see the patient in the morning, he does not want antibiotics at this time as it is unlikely that that fluid collection is a abscess.  Will discuss with hospitalist for admission   Noemi Chapel, MD 04/15/22 1911

## 2022-04-15 NOTE — ED Triage Notes (Signed)
Pt to ED c/o nausea and vomiting that started yesterday, Reports recent surgery, pt had  total colectomy on Monday. Reports pain controlled with medication regimen.

## 2022-04-15 NOTE — ED Provider Notes (Signed)
[facility] EMERGENCY DEPARTMENT Provider Note   CSN: [id number] Arrival date & time: 04/15/22  1130     History  Chief Complaint  Patient presents with   Post-op Problem   Emesis    Alexis Harris is a 52 y.o. female.  She underwent total colectomy 04/10/22 and was discharged from the hospital few days ago.  She said on discharge her pain was controlled she was eating and drinking.  Since discharge from hospital she has had nausea and vomiting and cannot eat.  She is able to swallow a little bit of liquid.  She said her pain has been well-controlled and she is only needed to use the pain meds once or twice.  She is moving her bowels.  No urinary symptoms no fever.  The history is provided by the patient.  Emesis Severity:  Moderate Duration:  3 days Timing:  Intermittent Progression:  Unchanged Chronicity:  New Relieved by:  Nothing Worsened by:  Liquids Ineffective treatments:  Antiemetics Associated symptoms: no abdominal pain, no cough, no diarrhea and no fever   Risk factors: prior abdominal surgery        Home Medications Prior to Admission medications   Medication Sig Start Date End Date Taking? Authorizing Provider  albuterol (VENTOLIN HFA) 108 (90 Base) MCG/ACT inhaler Inhale 2 puffs into the lungs every 6 (six) hours as needed for wheezing or shortness of breath.    [provider]  ARIPiprazole (ABILIFY) 10 MG tablet Take 10 mg by mouth in the morning.    [provider]  Ascorbic Acid (VITAMIN C) 1000 MG tablet Take 1,000 mg by mouth in the morning.    [provider]  atorvastatin (LIPITOR) 20 MG tablet Take 20 mg by mouth at bedtime.    [provider]  buprenorphine Haze Rushing) 10 MCG/HR Niotaze 1 patch onto the skin every Sunday.    [provider]  buPROPion (WELLBUTRIN SR) 150 MG 12 hr tablet Take 150 mg by mouth 2 (two) times daily. 12/26/21   [provider]  busPIRone (BUSPAR) 15 MG tablet Take 15 mg  by mouth 3 (three) times daily.    [provider]  cetirizine (ZYRTEC) 10 MG tablet Take 10 mg by mouth in the morning.    [provider]  CINNAMON PO Take 2 tablets by mouth in the morning.    [provider]  cyclobenzaprine (FLEXERIL) 10 MG tablet Take 10 mg by mouth 3 (three) times daily as needed for muscle spasms.    [provider]  DULERA 100-5 MCG/ACT AERO Inhale 2 puffs into the lungs 2 (two) times daily. 11/06/14   [provider]  famotidine (PEPCID) 20 MG tablet Take 1 tablet (20 mg total) by mouth at bedtime. 09/12/21   Sherron Monday, NP  fluvoxaMINE (LUVOX) 100 MG tablet Take 150 mg by mouth 2 (two) times daily.    [provider]  furosemide (LASIX) 20 MG tablet Take 20 mg by mouth in the morning and at bedtime. 06/28/21   [provider]  gabapentin (NEURONTIN) 400 MG capsule Take 400 mg by mouth 3 (three) times daily.    [provider]  Eloise Levels, Camillia sinensis, (GREEN TEA PO) Take 1 tablet by mouth in the morning. MEGA-T Green Tea FAT BURNER    [provider]  hydrOXYzine (ATARAX) 50 MG tablet Take 50-100 mg by mouth at bedtime as needed (sleep).    [provider]  lamoTRIgine (LAMICTAL) 200  MG tablet Take 200 mg by mouth every evening.    [provider]  loperamide (IMODIUM) 2 MG capsule Take 1 capsule (2 mg total) by mouth 4 (four) times daily -  with meals and at bedtime. 04/13/22   Virl Cagey, MD  lubiprostone (AMITIZA) 24 MCG capsule TAKE 1 CAPSULE (24 MCG TOTAL) BY MOUTH 2 (TWO) TIMES DAILY WITH A MEAL. 12/12/21   Sherron Monday, NP  metFORMIN (GLUCOPHAGE) 500 MG tablet Take 500 mg by mouth 2 (two) times daily. 08/06/20   [provider]  montelukast (SINGULAIR) 10 MG tablet Take 10 mg by mouth at bedtime.    [provider]  Multiple Vitamin (MULTIVITAMIN WITH MINERALS) TABS tablet Take 1 tablet by mouth in the morning.    [provider]  MYRBETRIQ 50 MG TB24 tablet Take 50 mg by mouth in the morning. 07/19/20   [provider]  OMEGA 3 1200 MG CAPS Take 1,200 mg by mouth in the morning.    [provider]  ondansetron (ZOFRAN) 4 MG tablet Take 1 tablet (4 mg total) by mouth every 6 (six) hours as needed for nausea. 04/13/22   Virl Cagey, MD  oxyCODONE (OXY IR/ROXICODONE) 5 MG immediate release tablet Take 1 tablet (5 mg total) by mouth every 4 (four) hours as needed for severe pain or breakthrough pain. Do not use with your pain patch 04/13/22   Virl Cagey, MD  oxymetazoline (AFRIN) 0.05 % nasal spray Place 1 spray into both nostrils 2 (two) times daily as needed for congestion.    [provider]  pantoprazole (PROTONIX) 40 MG tablet Take 1 tablet (40 mg total) by mouth 2 (two) times daily before a meal. 01/03/22 01/03/23  Sherron Monday, NP  Phenylephrine-Acetaminophen (SINUS + HEADACHE PO) Take 2 tablets by mouth daily as needed (sinus headaches).    [provider]  polyethylene glycol powder (GLYCOLAX/MIRALAX) powder Take 17 g by mouth in the morning. For constipation 01/30/15   [provider]  senna-docusate (SENOKOT-S) 8.6-50 MG tablet Take 2 tablets by mouth in the morning.    [provider]  SUMAtriptan (IMITREX) 100 MG tablet Take 100 mg by mouth every 2 (two) hours as needed for migraine. *TAKE ONE TABLET BY MOUTH AT ONSET OF MIGRAINE. IF MIGRAINE PERSISTS AFTER 2 HOURS, TAKE ONE ADDITIONAL TABLET. *DO NOT EXCEED MORE THAN 2 TABLETS IN A 24 HOUR PERIOD AND DO NOT USE NO MORE THAN 5 TABLETS WITHIN THE PERIOD OF 1 WEEK*    [provider]  topiramate (TOPAMAX) 50 MG tablet Take 150 mg by mouth in the morning. 07/25/20   [provider]  traZODone (DESYREL) 50 MG tablet Take 50-150 mg by mouth at bedtime.    [provider]  UNABLE TO FIND Apply topically as needed. Med Name: **HOME TENS UNIT: FOR LOWER CHRONIC BACK  PAIN/BACK PROBLEMS     [provider]  valACYclovir (VALTREX) 500 MG tablet Take 500 mg by mouth 2 (two) times daily as needed (outbreaks).    [provider]      Allergies    Advair diskus [fluticasone-salmeterol], Other, Codeine, and Penicillins    Review of Systems   Review of Systems  Constitutional:  Negative for fever.  Respiratory:  Negative for cough.   Cardiovascular:  Negative for chest pain.  Gastrointestinal:  Positive for nausea and vomiting. Negative for abdominal pain and diarrhea.  Genitourinary:  Negative for dysuria.  Physical Exam Updated Vital Signs BP (!) 132/98   Pulse (!) 122   Temp 98.4 F (36.9 C) (Oral)   Resp 18   Ht '5\' 4"'$  (1.626 m)   Wt 83 kg   SpO2 94%   BMI 31.41 kg/m  Physical Exam Vitals and nursing note reviewed.  Constitutional:      General: She is not in acute distress.    Appearance: Normal appearance. She is well-developed.  HENT:     Head: Normocephalic and atraumatic.  Eyes:     Conjunctiva/sclera: Conjunctivae normal.  Cardiovascular:     Rate and Rhythm: Regular rhythm. Tachycardia present.     Heart sounds: No murmur heard. Pulmonary:     Effort: Pulmonary effort is normal. No respiratory distress.     Breath sounds: Normal breath sounds.  Abdominal:     Palpations: Abdomen is soft.     Tenderness: There is abdominal tenderness. There is no guarding or rebound.     Comments: She has a midline surgical incision with staples intact no surrounding erythema.  She has some mild upper abdominal tenderness and distention.  No guarding or rebound.  Musculoskeletal:        General: No deformity. Normal range of motion.     Cervical back: Neck supple.  Skin:    General: Skin is warm and dry.     Capillary Refill: Capillary refill takes less than 2 seconds.  Neurological:     General: No focal deficit present.     Mental Status: She is alert.     ED Results / Procedures / Treatments   Labs (all labs  ordered are listed, but only abnormal results are displayed) Labs Reviewed  COMPREHENSIVE METABOLIC PANEL - Abnormal; Notable for the following components:      Result Value   Sodium 128 (*)    Chloride 88 (*)    Glucose, Bld 156 (*)    BUN 35 (*)    Creatinine, Ser 1.05 (*)    Total Protein 8.3 (*)    All other components within normal limits  CBC - Abnormal; Notable for the following components:   WBC 11.9 (*)    Hemoglobin 15.4 (*)    Platelets 407 (*)    All other components within normal limits  CULTURE, BLOOD (ROUTINE X 2)  CULTURE, BLOOD (ROUTINE X 2)  RESP PANEL BY RT-PCR (RSV, FLU A&B, COVID)  [id number]  LACTIC ACID, PLASMA  LACTIC ACID, PLASMA  URINALYSIS, ROUTINE W REFLEX MICROSCOPIC    EKG EKG Interpretation  Date/Time:  Saturday April 15 2022 12:34:04 EST Ventricular Rate:  124 PR Interval:  126 QRS Duration: 72 QT Interval:  352 QTC Calculation: 505 R Axis:   -21 Text Interpretation: Sinus tachycardia Right atrial enlargement Minimal voltage criteria for LVH, may be normal variant ( R in aVL ) Borderline ECG When compared with ECG of 07-Apr-2022 14:49, T wave inversion no longer evident in Inferior leads Nonspecific T wave abnormality, improved in Anterolateral leads Confirmed by Aletta Edouard [phone]) on 04/15/2022 12:36:13 PM  Radiology CT Abdomen Pelvis W Contrast  Result Date: 04/15/2022 CLINICAL DATA:  Total colectomy with ileorectal anastomosis 04/10/2022 for colonic polyps. Patient presents with abdominal pain, nausea and vomiting for 1 day. EXAM: CT ABDOMEN AND PELVIS WITH CONTRAST TECHNIQUE: Multidetector CT imaging of the abdomen and pelvis was performed using the standard protocol following bolus administration of intravenous contrast. RADIATION DOSE REDUCTION: This exam was performed according to the departmental dose-optimization program which includes  automated exposure control, adjustment of the mA and/or kV according to patient size and/or use of  iterative reconstruction technique. CONTRAST:  157m OMNIPAQUE IOHEXOL 300 MG/ML  SOLN COMPARISON:  07/05/2012 CT abdomen/pelvis. FINDINGS: Lower chest: Mild hypoventilatory changes at both lung bases. Hepatobiliary: Normal liver size. No liver mass. There is a new 6.4 x 3.7 x 3.4 cm extracapsular fluid collection between the right liver and posterior right hemidiaphragm with associated scalloping of the liver capsule (series 2/image 21). Cholelithiasis. No definite gallbladder wall thickening. Pancreas: Normal, with no mass or duct dilation. Spleen: Normal size. No mass. Adrenals/Urinary Tract: Normal adrenals. Scattered subcentimeter hypodense left renal cortical lesions are too small to characterize, for which no follow-up imaging is recommended. Otherwise normal kidneys, with no hydronephrosis. Prominently distended and otherwise normal bladder. Stomach/Bowel: Mild-to-moderate gastric distention with fluid and debris filled stomach. No gastric wall thickening. Interval near total colectomy with intact appearing ileocolic anastomosis in the lower abdomen to the right of midline. Generalized mild to moderate small bowel dilatation up to 5.2 cm diameter in the jejunum. No small bowel wall thickening or focal small bowel caliber transition. Unremarkable nondistended rectum. Vascular/Lymphatic: Atherosclerotic nonaneurysmal abdominal aorta. Patent portal, splenic, hepatic and renal veins. No pathologically enlarged lymph nodes in the abdomen or pelvis. Reproductive: Status post hysterectomy, with no abnormal findings at the vaginal cuff. No adnexal mass. Other: Trace ascitic fluid. Small amount of scattered retroperitoneal and intraperitoneal gas in lateral aspects of the bilateral abdomen. Small amount of scattered subcutaneous midline ventral abdominal wall gas. No additional focal fluid collections. Subcutaneous stimulator device in right buttock with single lead traversing right sacrum. Musculoskeletal: No  aggressive appearing focal osseous lesions. Marked lower lumbar degenerative disc disease. IMPRESSION: 1. Interval near-total colectomy with intact appearing ileocolic anastomosis. Generalized mild to moderate small bowel dilatation, most compatible with postoperative adynamic ileus. 2. New 6.4 x 3.7 x 3.4 cm extracapsular fluid collection between the right liver and posterior right hemidiaphragm with associated scalloping of the liver capsule, cannot exclude a postoperative abscess. 3. Trace ascitic fluid. Small amount of scattered retroperitoneal and intraperitoneal gas in the lateral aspects of the bilateral abdomen. Small amount of scattered subcutaneous midline ventral abdominal wall gas. Findings are presumably due to recent surgery. 4. Cholelithiasis.  No biliary ductal dilatation. 5. Prominently distended and otherwise normal bladder. No hydronephrosis. 6.  Aortic Atherosclerosis (ICD10-I70.0). Electronically Signed   By: JIlona SorrelM.D.   On: 04/15/2022 16:54    Procedures Procedures    Medications Ordered in ED Medications  iohexol (OMNIPAQUE) 9 MG/ML oral solution 500 mL (500 mLs Oral Contrast Given 04/15/22 1447)  lactated ringers bolus 1,000 mL (1,000 mLs Intravenous New Bag/Given 04/15/22 1325)  ondansetron (ZOFRAN) injection 4 mg (4 mg Intravenous Given 04/15/22 1320)  pantoprazole (PROTONIX) injection 40 mg (40 mg Intravenous Given 04/15/22 1319)    ED Course/ Medical Decision Making/ A&P Clinical Course as of 04/15/22 1522  Sat Apr 15, 2022  1353 Discussed with Dr. JArnoldo Moralegeneral surgery.  He said if the patient can tolerate scan with oral contrast he would feel it would be beneficial. [MB]    Clinical Course User Index [MB] BHayden Rasmussen MD                             Medical Decision Making Amount and/or Complexity of Data Reviewed Labs: ordered. Radiology: ordered.  Risk Prescription drug management.   This patient complains of nausea and  vomiting; this  involves an extensive number of treatment Options and is a complaint that carries with it a high risk of complications and morbidity. The differential includes obstruction, ileus, perforation, sepsis, Sirs  I ordered, reviewed and interpreted labs, which included CBC with mildly elevated white count normal hemoglobin, chemistries with low sodium low chloride elevated BUN and creatinine normal LFTs, lactate normal, blood culture sent I ordered medication IV fluids and nausea medication and reviewed PMP when indicated. I ordered imaging studies which included CT abdomen and pelvis and I signed this patient out to Dr. Sabra Heck to follow-up on results of scans Previous records obtained and reviewed in epic including recent operative notes I consulted Dr. Arnoldo Morale general surgery and discussed lab and imaging findings and discussed disposition.  Cardiac monitoring reviewed, sinus tachycardia Social determinants considered, patient with multiple barriers including tobacco stress food insecurity financial insecurity physical activity housing Critical Interventions: None  After the interventions stated above, I reevaluated the patient and found patient to be still feeling nauseous although tolerating p.o. prep Admission and further testing considered, her care is signed out to Dr. Sabra Heck to follow-up on results of CT and general surgery recommendations.         Final Clinical Impression(s) / ED Diagnoses Final diagnoses:  Postoperative nausea and vomiting    Rx / DC Orders ED Discharge Orders     None         Hayden Rasmussen, MD 04/15/22 1708

## 2022-04-16 ENCOUNTER — Other Ambulatory Visit: Payer: Self-pay

## 2022-04-16 ENCOUNTER — Encounter (HOSPITAL_COMMUNITY): Payer: Self-pay | Admitting: Internal Medicine

## 2022-04-16 DIAGNOSIS — K560 Paralytic ileus: Secondary | ICD-10-CM | POA: Diagnosis not present

## 2022-04-16 LAB — URINALYSIS, ROUTINE W REFLEX MICROSCOPIC
Bacteria, UA: NONE SEEN
Bilirubin Urine: NEGATIVE
Glucose, UA: NEGATIVE mg/dL
Hgb urine dipstick: NEGATIVE
Ketones, ur: 20 mg/dL — AB
Leukocytes,Ua: NEGATIVE
Nitrite: NEGATIVE
Protein, ur: 30 mg/dL — AB
Specific Gravity, Urine: 1.03 (ref 1.005–1.030)
pH: 6 (ref 5.0–8.0)

## 2022-04-16 LAB — CBC
HCT: 41.1 % (ref 36.0–46.0)
Hemoglobin: 14.3 g/dL (ref 12.0–15.0)
MCH: 31.3 pg (ref 26.0–34.0)
MCHC: 34.8 g/dL (ref 30.0–36.0)
MCV: 89.9 fL (ref 80.0–100.0)
Platelets: 394 10*3/uL (ref 150–400)
RBC: 4.57 MIL/uL (ref 3.87–5.11)
RDW: 13.5 % (ref 11.5–15.5)
WBC: 10.4 10*3/uL (ref 4.0–10.5)
nRBC: 0 % (ref 0.0–0.2)

## 2022-04-16 LAB — COMPREHENSIVE METABOLIC PANEL
ALT: 21 U/L (ref 0–44)
AST: 15 U/L (ref 15–41)
Albumin: 3.4 g/dL — ABNORMAL LOW (ref 3.5–5.0)
Alkaline Phosphatase: 75 U/L (ref 38–126)
Anion gap: 13 (ref 5–15)
BUN: 28 mg/dL — ABNORMAL HIGH (ref 6–20)
CO2: 22 mmol/L (ref 22–32)
Calcium: 8.5 mg/dL — ABNORMAL LOW (ref 8.9–10.3)
Chloride: 93 mmol/L — ABNORMAL LOW (ref 98–111)
Creatinine, Ser: 0.87 mg/dL (ref 0.44–1.00)
GFR, Estimated: >60 mL/min (ref >60)
Glucose, Bld: 140 mg/dL — ABNORMAL HIGH (ref 70–99)
Potassium: 3.7 mmol/L (ref 3.5–5.1)
Sodium: 128 mmol/L — ABNORMAL LOW (ref 135–145)
Total Bilirubin: 0.8 mg/dL (ref 0.3–1.2)
Total Protein: 7.2 g/dL (ref 6.5–8.1)

## 2022-04-16 LAB — CBG MONITORING, ED
Glucose-Capillary: 136 mg/dL — ABNORMAL HIGH (ref 70–99)
Glucose-Capillary: 149 mg/dL — ABNORMAL HIGH (ref 70–99)
Glucose-Capillary: 151 mg/dL — ABNORMAL HIGH (ref 70–99)

## 2022-04-16 LAB — HIV ANTIBODY (ROUTINE TESTING W REFLEX): HIV Screen 4th Generation wRfx: NONREACTIVE

## 2022-04-16 LAB — MAGNESIUM: Magnesium: 2.3 mg/dL (ref 1.7–2.4)

## 2022-04-16 LAB — PHOSPHORUS: Phosphorus: 4.1 mg/dL (ref 2.5–4.6)

## 2022-04-16 LAB — GLUCOSE, CAPILLARY
Glucose-Capillary: 121 mg/dL — ABNORMAL HIGH (ref 70–99)
Glucose-Capillary: 153 mg/dL — ABNORMAL HIGH (ref 70–99)
Glucose-Capillary: 133 mg/dL — ABNORMAL HIGH (ref 70–99)

## 2022-04-16 MED ORDER — ONDANSETRON HCL 4 MG/2ML IJ SOLN
4.0000 mg | Freq: Four times a day (QID) | INTRAMUSCULAR | Status: DC | PRN
  Administered 2022-04-17 – 2022-04-21 (×12): 4 mg via INTRAVENOUS
  Filled 2022-04-16 (×12): qty 2

## 2022-04-16 MED ORDER — POLYETHYLENE GLYCOL 3350 17 G PO PACK
17.0000 g | PACK | Freq: Every day | ORAL | Status: DC
  Administered 2022-04-16: 17 g via ORAL
  Filled 2022-04-16 (×2): qty 1

## 2022-04-16 MED ORDER — ACETAMINOPHEN 325 MG PO TABS
650.0000 mg | ORAL_TABLET | Freq: Four times a day (QID) | ORAL | Status: DC | PRN
  Administered 2022-04-16: 650 mg via ORAL
  Filled 2022-04-16: qty 2

## 2022-04-16 MED ORDER — SODIUM CHLORIDE 0.9 % IV BOLUS
1000.0000 mL | Freq: Once | INTRAVENOUS | Status: AC
  Administered 2022-04-16: 1000 mL via INTRAVENOUS

## 2022-04-16 MED ORDER — BUSPIRONE HCL 5 MG PO TABS
15.0000 mg | ORAL_TABLET | Freq: Three times a day (TID) | ORAL | Status: DC
  Administered 2022-04-16 – 2022-04-21 (×14): 15 mg via ORAL
  Filled 2022-04-16 (×14): qty 3

## 2022-04-16 MED ORDER — BUPROPION HCL ER (SR) 150 MG PO TB12
150.0000 mg | ORAL_TABLET | Freq: Two times a day (BID) | ORAL | Status: DC
  Administered 2022-04-16 – 2022-04-21 (×10): 150 mg via ORAL
  Filled 2022-04-16 (×10): qty 1

## 2022-04-16 NOTE — ED Notes (Signed)
Attempted again to call report to [phone], not ready, will call back.

## 2022-04-16 NOTE — TOC Progression Note (Signed)
Transition of Care [facility]) - Progression Note    Patient Details  Name: Alexis Harris MRN: [id number] Date of Birth: Feb 11, 1971  Transition of [facility]) CM/SW Contact  Salome Arnt, [place] Phone Number: 04/16/2022, 10:51 AM  Clinical Narrative:   Transition of Care (TOC) Screening Note   Patient Details  Name: Alexis Harris Date of Birth: [birth year]   Transition of Care [facility]) CM/SW Contact:    Salome Arnt, [place] Phone Number: 04/16/2022, 10:51 AM    Transition of Care Department [facility]) has reviewed patient and no TOC needs have been identified at this time. We will continue to monitor patient advancement through interdisciplinary progression rounds. If new patient transition needs arise, please place a TOC consult.         Barriers to Discharge: Continued Medical Work up  Expected Discharge Plan and Services                                               Social Determinants of Health (SDOH) Interventions SDOH Screenings   Food Insecurity: No Food Insecurity (04/16/2022)  Housing: Low Risk  (04/16/2022)  Transportation Needs: No Transportation Needs (04/16/2022)  Utilities: Not At Risk (04/16/2022)  Alcohol Screen: Low Risk  (08/25/2020)  Depression (PHQ2-9): Medium Risk (08/25/2020)  Financial Resource Strain: Medium Risk (08/25/2020)  Physical Activity: Inactive (08/25/2020)  Social Connections: Socially Isolated (08/25/2020)  Stress: Stress Concern Present (08/25/2020)  Tobacco Use: High Risk (04/16/2022)    Readmission Risk Interventions     No data to display

## 2022-04-16 NOTE — Progress Notes (Signed)
   04/16/22 0833  Assess: MEWS Score  Temp (!) 97.5 F (36.4 C)  BP (!) 124/92  MAP (mmHg) 102  Pulse Rate (!) 126  Resp 18  SpO2 95 %  O2 Device Room Air  Assess: MEWS Score  MEWS Temp 0  MEWS Systolic 0  MEWS Pulse 2  MEWS RR 0  MEWS LOC 0  MEWS Score 2  MEWS Score Color Yellow  Assess: if the MEWS score is Yellow or Red  Were vital signs taken at a resting state? Yes  Focused Assessment No change from prior assessment  Does the patient meet 2 or more of the SIRS criteria? No  MEWS guidelines implemented *See Row Information* Yes  Take Vital Signs  Increase Vital Sign Frequency  Yellow: Q 2hr X 2 then Q 4hr X 2, if remains yellow, continue Q 4hrs  Escalate  MEWS: Escalate Yellow: discuss with charge nurse/RN and consider discussing with provider and RRT  Notify: Charge Nurse/RN  Name of Charge Nurse/RN Notified Audrea Muscat  Date Charge Nurse/RN Notified 04/16/22  Time Charge Nurse/RN Notified 0900  Provider Notification  Provider Name/Title Dr Manuella Ghazi  Date Provider Notified 04/16/22  Time Provider Notified [phone]  Notification Reason Other (Comment) (yellow mews)  Date of Provider Response 04/16/22  Assess: SIRS CRITERIA  SIRS Temperature  0  SIRS Pulse 1  SIRS Respirations  0  SIRS WBC 0  SIRS Score Sum  1

## 2022-04-16 NOTE — ED Notes (Signed)
Attempted to call report to [phone], not ready for report yet. Will attempt again in a few minutes.

## 2022-04-16 NOTE — ED Notes (Signed)
Pt requesting Tylenol for mild pain instead of ordered PRN dilaudid. MD made aware.

## 2022-04-16 NOTE — Consult Note (Signed)
Reason for Consult: Nausea and vomiting Referring Physician: [name] is an 52 y.o. female.  HPI: [name] a 52 year old white female status post total colectomy by Dr. Constance Harris on 04/10/2022 who was discharged on 04/13/2022.  She states she was having several bowel movements a day but over the past 24 hours began having abdominal bloating, nausea, and vomiting.  As she was not feeling well, she presented to the emergency room there was noted to be tachycardic with hyponatremia and an elevated BUN.  She did have a CT scan of the abdomen pelvis which showed only trace ascites, ileus like pattern, and a fluid collection above the right liver.  She was admitted to the hospital for further management and treatment.  This morning, she states she does feel better and would like to take some p.o. fluids.  She has not had a bowel movement or passed flatus yet.  Past Medical History:  Diagnosis Date   Anxiety    Arthritis    Asthma    Back pain    Carpal tunnel syndrome    COPD (chronic obstructive pulmonary disease) (HCC)    Degenerative cervical disc    Depression    Diabetes mellitus without complication (HCC)    DJD (degenerative joint disease), lumbosacral    Fibromyalgia    GERD (gastroesophageal reflux disease)    Hypersomnia    IBS (irritable bowel syndrome)    Interstitial cystitis    Migraine    Migraines    Neuropathy    OCD (obsessive compulsive disorder)    PONV (postoperative nausea and vomiting)    PTSD (post-traumatic stress disorder)    Restless leg syndrome    Sleep apnea     Past Surgical History:  Procedure Laterality Date   ABDOMINAL HYSTERECTOMY     ABDOMINAL SURGERY     BALLOON DILATION N/A 04/18/2021   Procedure: BALLOON DILATION;  Surgeon: Alexis Harman, DO;  [facility];  Service: Endoscopy;  Laterality: N/A;   BIOPSY  04/18/2021   Procedure: BIOPSY;  Surgeon: Alexis Harman, DO;  [facility];  Service: Endoscopy;;    CATARACT EXTRACTION W/PHACO Right 11/10/2014   Procedure: CATARACT EXTRACTION PHACO AND INTRAOCULAR LENS PLACEMENT (IOC);  Surgeon: Alexis Guys, MD;  Location: [facility];  Service: Ophthalmology;  Laterality: Right;  CDE 2.04   CATARACT EXTRACTION W/PHACO Left 11/24/2014   Procedure: CATARACT EXTRACTION PHACO AND INTRAOCULAR LENS PLACEMENT (IOC);  Surgeon: Alexis Guys, MD;  Location: [facility];  Service: Ophthalmology;  Laterality: Left;  CDE:1.04   COLONOSCOPY WITH PROPOFOL N/A 01/30/2022   Procedure: COLONOSCOPY WITH PROPOFOL;  Surgeon: Alexis Harman, DO;  [facility];  Service: Endoscopy;  Laterality: N/A;  11:30am, asa 3   ESOPHAGOGASTRODUODENOSCOPY  09/2015   Dr. Mitchell Harris, [place]: esopahgeal lumen appears dilated and diffusely coated with old food. s/p brushing/bx and dilation. Benign squamous mucosal showing surface mucosal necrosis and focal hyperkeratosis. no fungal elements or EOE   ESOPHAGOGASTRODUODENOSCOPY (EGD) WITH PROPOFOL N/A 04/18/2021   Procedure: ESOPHAGOGASTRODUODENOSCOPY (EGD) WITH PROPOFOL;  Surgeon: Alexis Harman, DO;  [facility];  Service: Endoscopy;  Laterality: N/A;   EXPLORATORY LAPAROTOMY     x3   FLEXIBLE SIGMOIDOSCOPY  04/18/2021   Procedure: FLEXIBLE SIGMOIDOSCOPY;  Surgeon: Alexis Harman, DO;  [facility];  Service: Endoscopy;;   interstim medical device implanted     in lower back-to control bladder  MOUTH SURGERY     PARTIAL COLECTOMY N/A 04/10/2022   Procedure: TOTAL COLECTOMY;  Surgeon: Alexis Cagey, MD;  Location: [facility];  Service: General;  Laterality: N/A;   SUBMUCOSAL TATTOO INJECTION  01/30/2022   Procedure: SUBMUCOSAL TATTOO INJECTION;  Surgeon: Alexis Harman, DO;  [facility];  Service: Endoscopy;;    Family History  Adopted: Yes    Social History:  reports that she has been smoking cigarettes. She has a 12.50 pack-year smoking history. She has never used smokeless  tobacco. She reports current alcohol use. She reports that she does not use drugs.  Allergies:  Allergies  Allergen Reactions   Advair Diskus [Fluticasone-Salmeterol] Anaphylaxis   Other Hives    wool   Codeine Nausea And Vomiting   Penicillins Rash    Medications: I have reviewed the patient's current medications. Prior to Admission:  Medications Prior to Admission  Medication Sig Dispense Refill Last Dose   albuterol (VENTOLIN HFA) 108 (90 Base) MCG/ACT inhaler Inhale 2 puffs into the lungs every 6 (six) hours as needed for wheezing or shortness of breath.   unknown   ARIPiprazole (ABILIFY) 10 MG tablet Take 10 mg by mouth in the morning.   04/14/2022   Ascorbic Acid (VITAMIN C) 1000 MG tablet Take 1,000 mg by mouth in the morning.   04/14/2022   atorvastatin (LIPITOR) 20 MG tablet Take 20 mg by mouth at bedtime.   04/14/2022   buPROPion (WELLBUTRIN SR) 150 MG 12 hr tablet Take 150 mg by mouth 2 (two) times daily.   04/14/2022   busPIRone (BUSPAR) 15 MG tablet Take 15 mg by mouth 3 (three) times daily.   04/14/2022   cetirizine (ZYRTEC) 10 MG tablet Take 10 mg by mouth in the morning.   04/14/2022   CINNAMON PO Take 2 tablets by mouth in the morning.   04/14/2022   cyclobenzaprine (FLEXERIL) 10 MG tablet Take 10 mg by mouth 3 (three) times daily as needed for muscle spasms.   04/14/2022   DULERA 100-5 MCG/ACT AERO Inhale 2 puffs into the lungs 2 (two) times daily.  10 04/14/2022   estradiol (ESTRACE) 0.1 MG/GM vaginal cream Place 1 g vaginally 3 (three) times a week.   unknown   famotidine (PEPCID) 20 MG tablet Take 1 tablet (20 mg total) by mouth at bedtime. 90 tablet 3 04/14/2022   fluvoxaMINE (LUVOX) 100 MG tablet Take 150 mg by mouth 2 (two) times daily.   04/14/2022   furosemide (LASIX) 20 MG tablet Take 20 mg by mouth in the morning and at bedtime.   04/14/2022   gabapentin (NEURONTIN) 400 MG capsule Take 400 mg by mouth 3 (three) times daily.   04/14/2022   Green Tea, Camillia sinensis,  (GREEN TEA PO) Take 1 tablet by mouth in the morning. MEGA-T Green Tea FAT BURNER   04/14/2022   hydrOXYzine (ATARAX) 50 MG tablet Take 50-100 mg by mouth at bedtime as needed (sleep).   unknown   lamoTRIgine (LAMICTAL) 200 MG tablet Take 200 mg by mouth every evening.   04/14/2022   loperamide (IMODIUM) 2 MG capsule Take 1 capsule (2 mg total) by mouth 4 (four) times daily -  with meals and at bedtime. 30 capsule 3 04/14/2022   lubiprostone (AMITIZA) 24 MCG capsule TAKE 1 CAPSULE (24 MCG TOTAL) BY MOUTH 2 (TWO) TIMES DAILY WITH A MEAL. 180 capsule 1 Past Month   metFORMIN (GLUCOPHAGE) 500 MG tablet Take 500 mg by mouth 2 (two)  times daily.   04/14/2022   montelukast (SINGULAIR) 10 MG tablet Take 10 mg by mouth at bedtime.   04/14/2022   Multiple Vitamin (MULTIVITAMIN WITH MINERALS) TABS tablet Take 1 tablet by mouth in the morning.   04/14/2022   MYRBETRIQ 50 MG TB24 tablet Take 50 mg by mouth in the morning.   04/14/2022   OMEGA 3 1200 MG CAPS Take 1,200 mg by mouth in the morning.   04/14/2022   ondansetron (ZOFRAN) 4 MG tablet Take 1 tablet (4 mg total) by mouth every 6 (six) hours as needed for nausea. 20 tablet 0 04/14/2022   oxyCODONE (OXY IR/ROXICODONE) 5 MG immediate release tablet Take 1 tablet (5 mg total) by mouth every 4 (four) hours as needed for severe pain or breakthrough pain. Do not use with your pain patch 20 tablet 0 04/14/2022   oxymetazoline (AFRIN) 0.05 % nasal spray Place 1 spray into both nostrils 2 (two) times daily as needed for congestion.   unknown   pantoprazole (PROTONIX) 40 MG tablet Take 1 tablet (40 mg total) by mouth 2 (two) times daily before a meal. 60 tablet 6 04/14/2022   Phenylephrine-Acetaminophen (SINUS + HEADACHE PO) Take 2 tablets by mouth daily as needed (sinus headaches).   unknown   SUMAtriptan (IMITREX) 100 MG tablet Take 100 mg by mouth every 2 (two) hours as needed for migraine. *TAKE ONE TABLET BY MOUTH AT ONSET OF MIGRAINE. IF MIGRAINE PERSISTS AFTER 2 HOURS,  TAKE ONE ADDITIONAL TABLET. *DO NOT EXCEED MORE THAN 2 TABLETS IN A 24 HOUR PERIOD AND DO NOT USE NO MORE THAN 5 TABLETS WITHIN THE PERIOD OF 1 WEEK*   unknown   topiramate (TOPAMAX) 50 MG tablet Take 150 mg by mouth in the morning.   04/14/2022   traZODone (DESYREL) 50 MG tablet Take 50-150 mg by mouth at bedtime.   Past Week   UNABLE TO FIND Apply topically as needed. Med Name: **HOME TENS UNIT: FOR LOWER CHRONIC BACK PAIN/BACK PROBLEMS    unknown   valACYclovir (VALTREX) 500 MG tablet Take 500 mg by mouth 2 (two) times daily as needed (outbreaks).   Past Month   buprenorphine (BUTRANS) 10 MCG/HR PTWK Place 1 patch onto the skin every Sunday.      polyethylene glycol powder (GLYCOLAX/MIRALAX) powder Take 17 g by mouth in the morning. For constipation  11    senna-docusate (SENOKOT-S) 8.6-50 MG tablet Take 2 tablets by mouth in the morning.       Results for orders placed or performed during the hospital encounter of 04/15/22 (from the past 48 hour(s))  Lactic acid, plasma     Status: None   Collection Time: 04/15/22 12:30 PM  Result Value Ref Range   Lactic Acid, Venous 1.8 0.5 - 1.9 mmol/L    Comment: Performed at [facility], [address] [place], [place] [address]  Culture, blood (routine x 2)     Status: None (Preliminary result)   Collection Time: 04/15/22 12:43 PM   Specimen: BLOOD RIGHT HAND  Result Value Ref Range   Specimen Description BLOOD RIGHT HAND BOTTLES DRAWN AEROBIC ONLY    Special Requests Blood Culture adequate volume    Culture      NO GROWTH < 12 HOURS Performed at [facility], [address], [place], [place] [address]    Report Status PENDING   Culture, blood (routine x 2)     Status: None (Preliminary result)   Collection Time: 04/15/22 12:48 PM  Specimen: BLOOD LEFT HAND  Result Value Ref Range   Specimen Description      BLOOD LEFT HAND BOTTLES DRAWN AEROBIC AND ANAEROBIC   Special Requests Blood Culture adequate volume    Culture      NO GROWTH < 12  HOURS Performed at [facility], [address] [place], [place] [address]    Report Status PENDING   Comprehensive metabolic panel     Status: Abnormal   Collection Time: 04/15/22  1:14 PM  Result Value Ref Range   Sodium 128 (L) 135 - 145 mmol/L   Potassium 3.9 3.5 - 5.1 mmol/L   Chloride 88 (L) 98 - 111 mmol/L   CO2 25 22 - 32 mmol/L   Glucose, Bld 156 (H) 70 - 99 mg/dL    Comment: Glucose reference range applies only to samples taken after fasting for at least 8 hours.   BUN 35 (H) 6 - 20 mg/dL   Creatinine, Ser 1.05 (H) 0.44 - 1.00 mg/dL   Calcium 9.1 8.9 - 10.3 mg/dL   Total Protein 8.3 (H) 6.5 - 8.1 g/dL   Albumin 3.8 3.5 - 5.0 g/dL   AST 18 15 - 41 U/L   ALT 26 0 - 44 U/L   Alkaline Phosphatase 89 38 - 126 U/L   Total Bilirubin 0.6 0.3 - 1.2 mg/dL   GFR, Estimated >60 >60 mL/min    Comment: (NOTE) Calculated using the CKD-EPI Creatinine Equation (2021)    Anion gap 15 5 - 15    Comment: Performed at [facility], [address] [place], [place] [address]  CBC     Status: Abnormal   Collection Time: 04/15/22  1:14 PM  Result Value Ref Range   WBC 11.9 (H) 4.0 - 10.5 K/uL   RBC 4.98 3.87 - 5.11 MIL/uL   Hemoglobin 15.4 (H) 12.0 - 15.0 g/dL   HCT 43.7 36.0 - 46.0 %   MCV 87.8 80.0 - 100.0 fL   MCH 30.9 26.0 - 34.0 pg   MCHC 35.2 30.0 - 36.0 g/dL   RDW 13.6 11.5 - 15.5 %   Platelets 407 (H) 150 - 400 K/uL   nRBC 0.0 0.0 - 0.2 %    Comment: Performed at [facility], [address], [place], [place] [address]  Resp panel by RT-PCR (RSV, Flu A&B, Covid) Anterior Nasal Swab     Status: None   Collection Time: 04/15/22  1:18 PM   Specimen: Anterior Nasal Swab  Result Value Ref Range   SARS Coronavirus 2 by RT PCR NEGATIVE NEGATIVE    Comment: (NOTE) SARS-CoV-2 target nucleic acids are NOT DETECTED.  The SARS-CoV-2 RNA is generally detectable in upper respiratory specimens during the acute phase of infection. The lowest concentration of SARS-CoV-2 viral copies this  assay can detect is 138 copies/mL. A negative result does not preclude SARS-Cov-2 infection and should not be used as the sole basis for treatment or other patient management decisions. A negative result may occur with  improper specimen collection/handling, submission of specimen other than nasopharyngeal swab, presence of viral mutation(s) within the areas targeted by this assay, and inadequate number of viral copies(<138 copies/mL). A negative result must be combined with clinical observations, patient history, and epidemiological information. The expected result is Negative.  Fact Sheet for Patients:  EntrepreneurPulse.com.au  Fact Sheet for Healthcare Providers:  IncredibleEmployment.be  This test is no t yet approved or cleared by the [place] FDA and  has been authorized for detection  and/or diagnosis of SARS-CoV-2 by FDA under an Emergency Use Authorization (EUA). This EUA will remain  in effect (meaning this test can be used) for the duration of the COVID-19 declaration under Section 564(b)(1) of the Act, 21 U.S.C.section 360bbb-3(b)(1), unless the authorization is terminated  or revoked sooner.       Influenza A by PCR NEGATIVE NEGATIVE   Influenza B by PCR NEGATIVE NEGATIVE    Comment: (NOTE) The Xpert Xpress SARS-CoV-2/FLU/RSV plus assay is intended as an aid in the diagnosis of influenza from Nasopharyngeal swab specimens and should not be used as a sole basis for treatment. Nasal washings and aspirates are unacceptable for Xpert Xpress SARS-CoV-2/FLU/RSV testing.  Fact Sheet for Patients: EntrepreneurPulse.com.au  Fact Sheet for Healthcare Providers: IncredibleEmployment.be  This test is not yet approved or cleared by the [place] FDA and has been authorized for detection and/or diagnosis of SARS-CoV-2 by FDA under an Emergency Use Authorization (EUA). This EUA will remain in  effect (meaning this test can be used) for the duration of the COVID-19 declaration under Section 564(b)(1) of the Act, 21 U.S.C. section 360bbb-3(b)(1), unless the authorization is terminated or revoked.     Resp Syncytial Virus by PCR NEGATIVE NEGATIVE    Comment: (NOTE) Fact Sheet for Patients: EntrepreneurPulse.com.au  Fact Sheet for Healthcare Providers: IncredibleEmployment.be  This test is not yet approved or cleared by the [place] FDA and has been authorized for detection and/or diagnosis of SARS-CoV-2 by FDA under an Emergency Use Authorization (EUA). This EUA will remain in effect (meaning this test can be used) for the duration of the COVID-19 declaration under Section 564(b)(1) of the Act, 21 U.S.C. section 360bbb-3(b)(1), unless the authorization is terminated or revoked.  Performed at [facility], [address], [place], [place] [address]   Lactic acid, plasma     Status: None   Collection Time: 04/15/22  4:13 PM  Result Value Ref Range   Lactic Acid, Venous 1.4 0.5 - 1.9 mmol/L    Comment: Performed at [facility], [address], [place], [place] [address]  CBG monitoring, ED     Status: Abnormal   Collection Time: 04/15/22  9:27 PM  Result Value Ref Range   Glucose-Capillary 157 (H) 70 - 99 mg/dL    Comment: Glucose reference range applies only to samples taken after fasting for at least 8 hours.  CBG monitoring, ED     Status: Abnormal   Collection Time: 04/16/22 12:27 AM  Result Value Ref Range   Glucose-Capillary 136 (H) 70 - 99 mg/dL    Comment: Glucose reference range applies only to samples taken after fasting for at least 8 hours.  Urinalysis, Routine w reflex microscopic Urine, Clean Catch     Status: Abnormal   Collection Time: 04/16/22  2:40 AM  Result Value Ref Range   Color, Urine YELLOW YELLOW   APPearance CLEAR CLEAR   Specific Gravity, Urine 1.030 1.005 - 1.030   pH 6.0 5.0 - 8.0   Glucose, UA  NEGATIVE NEGATIVE mg/dL   Hgb urine dipstick NEGATIVE NEGATIVE   Bilirubin Urine NEGATIVE NEGATIVE   Ketones, ur 20 (A) NEGATIVE mg/dL   Protein, ur 30 (A) NEGATIVE mg/dL   Nitrite NEGATIVE NEGATIVE   Leukocytes,Ua NEGATIVE NEGATIVE   RBC / HPF 0-5 0 - 5 RBC/hpf   WBC, UA 0-5 0 - 5 WBC/hpf   Bacteria, UA NONE SEEN NONE SEEN   Squamous Epithelial / HPF 0-5 0 - 5 /HPF   Mucus  PRESENT     Comment: Performed at [facility], [address], [place], [place] [address]  Comprehensive metabolic panel     Status: Abnormal   Collection Time: 04/16/22  3:12 AM  Result Value Ref Range   Sodium 128 (L) 135 - 145 mmol/L   Potassium 3.7 3.5 - 5.1 mmol/L   Chloride 93 (L) 98 - 111 mmol/L   CO2 22 22 - 32 mmol/L   Glucose, Bld 140 (H) 70 - 99 mg/dL    Comment: Glucose reference range applies only to samples taken after fasting for at least 8 hours.   BUN 28 (H) 6 - 20 mg/dL   Creatinine, Ser 0.87 0.44 - 1.00 mg/dL   Calcium 8.5 (L) 8.9 - 10.3 mg/dL   Total Protein 7.2 6.5 - 8.1 g/dL   Albumin 3.4 (L) 3.5 - 5.0 g/dL   AST 15 15 - 41 U/L   ALT 21 0 - 44 U/L   Alkaline Phosphatase 75 38 - 126 U/L   Total Bilirubin 0.8 0.3 - 1.2 mg/dL   GFR, Estimated >60 >60 mL/min    Comment: (NOTE) Calculated using the CKD-EPI Creatinine Equation (2021)    Anion gap 13 5 - 15    Comment: Performed at [facility], [address] [place], [place] [address]  CBC     Status: None   Collection Time: 04/16/22  3:12 AM  Result Value Ref Range   WBC 10.4 4.0 - 10.5 K/uL   RBC 4.57 3.87 - 5.11 MIL/uL   Hemoglobin 14.3 12.0 - 15.0 g/dL   HCT 41.1 36.0 - 46.0 %   MCV 89.9 80.0 - 100.0 fL   MCH 31.3 26.0 - 34.0 pg   MCHC 34.8 30.0 - 36.0 g/dL   RDW 13.5 11.5 - 15.5 %   Platelets 394 150 - 400 K/uL   nRBC 0.0 0.0 - 0.2 %    Comment: Performed at [facility], [address] [place], [place] [address]  Magnesium     Status: None   Collection Time: 04/16/22  3:12 AM  Result Value Ref Range   Magnesium 2.3 1.7 -  2.4 mg/dL    Comment: Performed at [facility], [address], [place], [place] [address]  Phosphorus     Status: None   Collection Time: 04/16/22  3:12 AM  Result Value Ref Range   Phosphorus 4.1 2.5 - 4.6 mg/dL    Comment: Performed at [facility], [address], [place],  [address]  CBG monitoring, ED     Status: Abnormal   Collection Time: 04/16/22  4:10 AM  Result Value Ref Range   Glucose-Capillary 151 (H) 70 - 99 mg/dL    Comment: Glucose reference range applies only to samples taken after fasting for at least 8 hours.  CBG monitoring, ED     Status: Abnormal   Collection Time: 04/16/22  7:56 AM  Result Value Ref Range   Glucose-Capillary 149 (H) 70 - 99 mg/dL    Comment: Glucose reference range applies only to samples taken after fasting for at least 8 hours.    CT Abdomen Pelvis W Contrast  Result Date: 04/15/2022 CLINICAL DATA:  Total colectomy with ileorectal anastomosis 04/10/2022 for colonic polyps. Patient presents with abdominal pain, nausea and vomiting for 1 day. EXAM: CT ABDOMEN AND PELVIS WITH CONTRAST TECHNIQUE: Multidetector CT imaging of the abdomen and pelvis was performed using the standard protocol following bolus administration of intravenous contrast. RADIATION DOSE REDUCTION: This exam  was performed according to the departmental dose-optimization program which includes automated exposure control, adjustment of the mA and/or kV according to patient size and/or use of iterative reconstruction technique. CONTRAST:  178m OMNIPAQUE IOHEXOL 300 MG/ML  SOLN COMPARISON:  07/05/2012 CT abdomen/pelvis. FINDINGS: Lower chest: Mild hypoventilatory changes at both lung bases. Hepatobiliary: Normal liver size. No liver mass. There is a new 6.4 x 3.7 x 3.4 cm extracapsular fluid collection between the right liver and posterior right hemidiaphragm with associated scalloping of the liver capsule (series 2/image 21). Cholelithiasis. No definite gallbladder wall thickening.  Pancreas: Normal, with no mass or duct dilation. Spleen: Normal size. No mass. Adrenals/Urinary Tract: Normal adrenals. Scattered subcentimeter hypodense left renal cortical lesions are too small to characterize, for which no follow-up imaging is recommended. Otherwise normal kidneys, with no hydronephrosis. Prominently distended and otherwise normal bladder. Stomach/Bowel: Mild-to-moderate gastric distention with fluid and debris filled stomach. No gastric wall thickening. Interval near total colectomy with intact appearing ileocolic anastomosis in the lower abdomen to the right of midline. Generalized mild to moderate small bowel dilatation up to 5.2 cm diameter in the jejunum. No small bowel wall thickening or focal small bowel caliber transition. Unremarkable nondistended rectum. Vascular/Lymphatic: Atherosclerotic nonaneurysmal abdominal aorta. Patent portal, splenic, hepatic and renal veins. No pathologically enlarged lymph nodes in the abdomen or pelvis. Reproductive: Status post hysterectomy, with no abnormal findings at the vaginal cuff. No adnexal mass. Other: Trace ascitic fluid. Small amount of scattered retroperitoneal and intraperitoneal gas in lateral aspects of the bilateral abdomen. Small amount of scattered subcutaneous midline ventral abdominal wall gas. No additional focal fluid collections. Subcutaneous stimulator device in right buttock with single lead traversing right sacrum. Musculoskeletal: No aggressive appearing focal osseous lesions. Marked lower lumbar degenerative disc disease. IMPRESSION: 1. Interval near-total colectomy with intact appearing ileocolic anastomosis. Generalized mild to moderate small bowel dilatation, most compatible with postoperative adynamic ileus. 2. New 6.4 x 3.7 x 3.4 cm extracapsular fluid collection between the right liver and posterior right hemidiaphragm with associated scalloping of the liver capsule, cannot exclude a postoperative abscess. 3. Trace ascitic  fluid. Small amount of scattered retroperitoneal and intraperitoneal gas in the lateral aspects of the bilateral abdomen. Small amount of scattered subcutaneous midline ventral abdominal wall gas. Findings are presumably due to recent surgery. 4. Cholelithiasis.  No biliary ductal dilatation. 5. Prominently distended and otherwise normal bladder. No hydronephrosis. 6.  Aortic Atherosclerosis (ICD10-I70.0). Electronically Signed   By: JIlona SorrelM.D.   On: 04/15/2022 16:54    ROS:  Pertinent items are noted in HPI.  Blood pressure (!) 124/92, pulse (!) 116, temperature (!) 97.5 F (36.4 C), temperature source Oral, resp. rate 18, height '5\' 4"'$  (1.626 m), weight 83 kg, SpO2 95 %. Physical Exam: Pleasant white female who appears mildly fatigued, but in no acute distress Head is normocephalic, atraumatic Lungs are clear to auscultation with equal breath sounds bilaterally Heart examination reveals a tachycardic rhythm which is regular in nature. Abdomen is soft with minimal distention noted.  Incision is healing well.  No drainage noted.  CT scan images personally reviewed  Assessment/Plan: Impression: Postoperative ileus with hyponatremia and dehydration.  This appears to be from her multiple episodes of emesis at home.  She does not have diarrhea or loose bowel movements at the present time.  The white blood cell count has normalized.  I do not feel she has an intra-abdominal infection at the present time.  No need for IV antibiotics.  Will transfer  patient to our service.  A normal saline fluid bolus will be given.  Will monitor cardiac function.  Alexis Harris 04/16/2022, 10:14 AM

## 2022-04-16 NOTE — Progress Notes (Signed)
PROGRESS NOTE    Alexis Harris  ZOX:[id number] DOB: Dec 31, 1970 DOA: 04/15/2022 PCP: Carrolyn Meiers, MD   Brief Narrative:    Alexis Harris is a 52 y.o. female with medical history significant of hyperlipidemia, GERD, COPD, chronic pain, anxiety and depression who presents to the emergency department due to nausea, vomiting and abdominal pain which started yesterday in the morning (04/14/2022).  Patient underwent total colectomy and side-to-side anastomosis with ileum and rectosigmoid junction on 04/10/2022 due to multiple adenomatous polyps throughout the colon.  Patient was admitted with GI symptoms in the setting of postoperative adynamic ileus.  General surgery consultation pending.  Assessment & Plan:   Principal Problem:   Adynamic ileus ([place]) Active Problems:   GERD (gastroesophageal reflux disease)   Nausea & vomiting   Abdominal pain   Leukocytosis   Thrombocytosis   Type 2 diabetes mellitus with hyperglycemia (HCC)   Prolonged QT interval   Mixed hyperlipidemia   COPD (chronic obstructive pulmonary disease) (HCC)   Anxiety and depression  Assessment and Plan:   Nausea, vomiting and abdominal pain in the setting of postoperative adynamic ileus Continue IV Compazine as needed Continue IV Dilaudid 0.5 mg every 3 hours as needed for moderate/severe. Patient will be temporarily placed n.p.o. pending surgical consult in the morning Continue IV hydration Zofran as needed for nausea or vomiting   Hyponatremia possibly secondary to dehydration This is possibly secondary to patient's vomiting and several loose bowel movements requiring Imodium after the surgery Sodium 128, continue IV hydration Continue to monitor sodium levels with serial BMPs   Leukocytosis, thrombocytosis possibly secondary to hemoconcentration Continue IV hydration   Type 2 diabetes mellitus with hyperglycemia Continue ISS and hypoglycemic protocol Metformin will be held at this time    Prolonged QT interval QTc 577m Avoid QT prolonging drugs Magnesium level will be checked Repeat EKG in the morning   Mixed hyperlipidemia Continue statin when patient resumes oral intake   COPD Continue Singulair   GERD Continue Protonix   Anxiety and depression Consider continuing patient's home meds when patient resumes oral intake  Obesity BMI 31.41    DVT prophylaxis: SCDs Code Status: Full Family Communication: None at bedside Disposition Plan:  Status is: Inpatient Remains inpatient appropriate because: Need for IV medications.  Consultants:  General surgery  Procedures:  None  Antimicrobials:  None   Subjective: Patient seen and evaluated today with ongoing nausea but no further vomiting.  Denies significant abdominal pain.  Objective: Vitals:   04/16/22 0700 04/16/22 0753 04/16/22 0833 04/16/22 0852  BP: (!) 139/94 (!) 135/92 (!) 124/92   Pulse: (!) 113 (!) 118 (!) 126 (!) 116  Resp: '12 17 18   '$ Temp:  (!) 97.5 F (36.4 C) (!) 97.5 F (36.4 C)   TempSrc:  Axillary Oral   SpO2: 93% 91% 95%   Weight:      Height:        Intake/Output Summary (Last 24 hours) at 04/16/2022 1018 Last data filed at 04/16/2022 0900 Gross per 24 hour  Intake 1000 ml  Output 1300 ml  Net -300 ml   Filed Weights   04/15/22 1224  Weight: 83 kg    Examination:  General exam: Appears calm and comfortable  Respiratory system: Clear to auscultation. Respiratory effort normal. Cardiovascular system: S1 & S2 heard, RRR.  Gastrointestinal system: Abdomen is soft, midline incision C/D/I Central nervous system: Alert and awake Extremities: No edema Skin: No significant lesions noted Psychiatry: Flat affect.  Data Reviewed: I have personally reviewed following labs and imaging studies  CBC: Recent Labs  Lab 04/11/22 0319 04/12/22 0310 04/13/22 0405 04/15/22 1314 04/16/22 0312  WBC 9.6 7.4 7.9 11.9* 10.4  NEUTROABS 7.2 4.9  --   --   --   HGB 11.7*  11.7* 11.6* 15.4* 14.3  HCT 35.3* 35.4* 35.1* 43.7 41.1  MCV 94.9 94.7 94.6 87.8 89.9  PLT 248 220 229 407* 836   Basic Metabolic Panel: Recent Labs  Lab 04/11/22 0319 04/12/22 0310 04/13/22 0405 04/15/22 1314 04/16/22 0312  NA 137 136 137 128* 128*  K 3.4* 3.0* 3.8 3.9 3.7  CL 107 103 102 88* 93*  CO2 21* '22 25 25 22  '$ GLUCOSE 129* 138* 130* 156* 140*  BUN '10 8 6 '$ 35* 28*  CREATININE 0.71 0.77 0.84 1.05* 0.87  CALCIUM 8.6* 8.4* 8.5* 9.1 8.5*  MG 1.9 1.9  --   --  2.3  PHOS 3.1 3.4  --   --  4.1   GFR: Estimated Creatinine Clearance: 77.9 mL/min (by C-G formula based on SCr of 0.87 mg/dL). Liver Function Tests: Recent Labs  Lab 04/15/22 1314 04/16/22 0312  AST 18 15  ALT 26 21  ALKPHOS 89 75  BILITOT 0.6 0.8  PROT 8.3* 7.2  ALBUMIN 3.8 3.4*   No results for input(s): "LIPASE", "AMYLASE" in the last 168 hours. No results for input(s): "AMMONIA" in the last 168 hours. Coagulation Profile: No results for input(s): "INR", "PROTIME" in the last 168 hours. Cardiac Enzymes: No results for input(s): "CKTOTAL", "CKMB", "CKMBINDEX", "TROPONINI" in the last 168 hours. BNP (last 3 results) No results for input(s): "PROBNP" in the last 8760 hours. HbA1C: No results for input(s): "HGBA1C" in the last 72 hours. CBG: Recent Labs  Lab 04/13/22 1141 04/15/22 2127 04/16/22 0027 04/16/22 0410 04/16/22 0756  GLUCAP 130* 157* 136* 151* 149*   Lipid Profile: No results for input(s): "CHOL", "HDL", "LDLCALC", "TRIG", "CHOLHDL", "LDLDIRECT" in the last 72 hours. Thyroid Function Tests: No results for input(s): "TSH", "T4TOTAL", "FREET4", "T3FREE", "THYROIDAB" in the last 72 hours. Anemia Panel: No results for input(s): "VITAMINB12", "FOLATE", "FERRITIN", "TIBC", "IRON", "RETICCTPCT" in the last 72 hours. Sepsis Labs: Recent Labs  Lab 04/15/22 1230 04/15/22 1613  LATICACIDVEN 1.8 1.4    Recent Results (from the past 240 hour(s))  Culture, blood (routine x 2)     Status:  None (Preliminary result)   Collection Time: 04/15/22 12:43 PM   Specimen: BLOOD RIGHT HAND  Result Value Ref Range Status   Specimen Description BLOOD RIGHT HAND BOTTLES DRAWN AEROBIC ONLY  Final   Special Requests Blood Culture adequate volume  Final   Culture   Final    NO GROWTH < 12 HOURS Performed at [facility], [address] [place], [place] [address]    Report Status PENDING  Incomplete  Culture, blood (routine x 2)     Status: None (Preliminary result)   Collection Time: 04/15/22 12:48 PM   Specimen: BLOOD LEFT HAND  Result Value Ref Range Status   Specimen Description   Final    BLOOD LEFT HAND BOTTLES DRAWN AEROBIC AND ANAEROBIC   Special Requests Blood Culture adequate volume  Final   Culture   Final    NO GROWTH < 12 HOURS Performed at [facility], [address] [place], [place] [address]    Report Status PENDING  Incomplete  Resp panel by RT-PCR (RSV, Flu A&B, Covid) Anterior Nasal Swab  Status: None   Collection Time: 04/15/22  1:18 PM   Specimen: Anterior Nasal Swab  Result Value Ref Range Status   SARS Coronavirus 2 by RT PCR NEGATIVE NEGATIVE Final    Comment: (NOTE) SARS-CoV-2 target nucleic acids are NOT DETECTED.  The SARS-CoV-2 RNA is generally detectable in upper respiratory specimens during the acute phase of infection. The lowest concentration of SARS-CoV-2 viral copies this assay can detect is 138 copies/mL. A negative result does not preclude SARS-Cov-2 infection and should not be used as the sole basis for treatment or other patient management decisions. A negative result may occur with  improper specimen collection/handling, submission of specimen other than nasopharyngeal swab, presence of viral mutation(s) within the areas targeted by this assay, and inadequate number of viral copies(<138 copies/mL). A negative result must be combined with clinical observations, patient history, and epidemiological information. The expected result is  Negative.  Fact Sheet for Patients:  EntrepreneurPulse.com.au  Fact Sheet for Healthcare Providers:  IncredibleEmployment.be  This test is no t yet approved or cleared by the [place] FDA and  has been authorized for detection and/or diagnosis of SARS-CoV-2 by FDA under an Emergency Use Authorization (EUA). This EUA will remain  in effect (meaning this test can be used) for the duration of the COVID-19 declaration under Section 564(b)(1) of the Act, 21 U.S.C.section 360bbb-3(b)(1), unless the authorization is terminated  or revoked sooner.       Influenza A by PCR NEGATIVE NEGATIVE Final   Influenza B by PCR NEGATIVE NEGATIVE Final    Comment: (NOTE) The Xpert Xpress SARS-CoV-2/FLU/RSV plus assay is intended as an aid in the diagnosis of influenza from Nasopharyngeal swab specimens and should not be used as a sole basis for treatment. Nasal washings and aspirates are unacceptable for Xpert Xpress SARS-CoV-2/FLU/RSV testing.  Fact Sheet for Patients: EntrepreneurPulse.com.au  Fact Sheet for Healthcare Providers: IncredibleEmployment.be  This test is not yet approved or cleared by the [place] FDA and has been authorized for detection and/or diagnosis of SARS-CoV-2 by FDA under an Emergency Use Authorization (EUA). This EUA will remain in effect (meaning this test can be used) for the duration of the COVID-19 declaration under Section 564(b)(1) of the Act, 21 U.S.C. section 360bbb-3(b)(1), unless the authorization is terminated or revoked.     Resp Syncytial Virus by PCR NEGATIVE NEGATIVE Final    Comment: (NOTE) Fact Sheet for Patients: EntrepreneurPulse.com.au  Fact Sheet for Healthcare Providers: IncredibleEmployment.be  This test is not yet approved or cleared by the [place] FDA and has been authorized for detection and/or diagnosis of  SARS-CoV-2 by FDA under an Emergency Use Authorization (EUA). This EUA will remain in effect (meaning this test can be used) for the duration of the COVID-19 declaration under Section 564(b)(1) of the Act, 21 U.S.C. section 360bbb-3(b)(1), unless the authorization is terminated or revoked.  Performed at [facility], [address], [place], [place] [address]          Radiology Studies: CT Abdomen Pelvis W Contrast  Result Date: 04/15/2022 CLINICAL DATA:  Total colectomy with ileorectal anastomosis 04/10/2022 for colonic polyps. Patient presents with abdominal pain, nausea and vomiting for 1 day. EXAM: CT ABDOMEN AND PELVIS WITH CONTRAST TECHNIQUE: Multidetector CT imaging of the abdomen and pelvis was performed using the standard protocol following bolus administration of intravenous contrast. RADIATION DOSE REDUCTION: This exam was performed according to the departmental dose-optimization program which includes automated exposure control, adjustment of the mA and/or kV according to patient  size and/or use of iterative reconstruction technique. CONTRAST:  134m OMNIPAQUE IOHEXOL 300 MG/ML  SOLN COMPARISON:  07/05/2012 CT abdomen/pelvis. FINDINGS: Lower chest: Mild hypoventilatory changes at both lung bases. Hepatobiliary: Normal liver size. No liver mass. There is a new 6.4 x 3.7 x 3.4 cm extracapsular fluid collection between the right liver and posterior right hemidiaphragm with associated scalloping of the liver capsule (series 2/image 21). Cholelithiasis. No definite gallbladder wall thickening. Pancreas: Normal, with no mass or duct dilation. Spleen: Normal size. No mass. Adrenals/Urinary Tract: Normal adrenals. Scattered subcentimeter hypodense left renal cortical lesions are too small to characterize, for which no follow-up imaging is recommended. Otherwise normal kidneys, with no hydronephrosis. Prominently distended and otherwise normal bladder. Stomach/Bowel: Mild-to-moderate gastric  distention with fluid and debris filled stomach. No gastric wall thickening. Interval near total colectomy with intact appearing ileocolic anastomosis in the lower abdomen to the right of midline. Generalized mild to moderate small bowel dilatation up to 5.2 cm diameter in the jejunum. No small bowel wall thickening or focal small bowel caliber transition. Unremarkable nondistended rectum. Vascular/Lymphatic: Atherosclerotic nonaneurysmal abdominal aorta. Patent portal, splenic, hepatic and renal veins. No pathologically enlarged lymph nodes in the abdomen or pelvis. Reproductive: Status post hysterectomy, with no abnormal findings at the vaginal cuff. No adnexal mass. Other: Trace ascitic fluid. Small amount of scattered retroperitoneal and intraperitoneal gas in lateral aspects of the bilateral abdomen. Small amount of scattered subcutaneous midline ventral abdominal wall gas. No additional focal fluid collections. Subcutaneous stimulator device in right buttock with single lead traversing right sacrum. Musculoskeletal: No aggressive appearing focal osseous lesions. Marked lower lumbar degenerative disc disease. IMPRESSION: 1. Interval near-total colectomy with intact appearing ileocolic anastomosis. Generalized mild to moderate small bowel dilatation, most compatible with postoperative adynamic ileus. 2. New 6.4 x 3.7 x 3.4 cm extracapsular fluid collection between the right liver and posterior right hemidiaphragm with associated scalloping of the liver capsule, cannot exclude a postoperative abscess. 3. Trace ascitic fluid. Small amount of scattered retroperitoneal and intraperitoneal gas in the lateral aspects of the bilateral abdomen. Small amount of scattered subcutaneous midline ventral abdominal wall gas. Findings are presumably due to recent surgery. 4. Cholelithiasis.  No biliary ductal dilatation. 5. Prominently distended and otherwise normal bladder. No hydronephrosis. 6.  Aortic Atherosclerosis  (ICD10-I70.0). Electronically Signed   By: JIlona SorrelM.D.   On: 04/15/2022 16:54        Scheduled Meds:  insulin aspart  0-15 Units Subcutaneous Q4H   montelukast  10 mg Oral QHS   pantoprazole (PROTONIX) IV  40 mg Intravenous Q24H   polyethylene glycol  17 g Oral Daily   Continuous Infusions:  sodium chloride 100 mL/hr at 04/15/22 2048   sodium chloride       LOS: 1 day    Time spent: 35 minutes    Cristen Murcia DDarleen Crocker DO [facility]  If 7PM-7AM, please contact night-coverage [email or web address] 04/16/2022, 10:18 AM

## 2022-04-17 LAB — MAGNESIUM: Magnesium: 2.2 mg/dL (ref 1.7–2.4)

## 2022-04-17 LAB — GLUCOSE, CAPILLARY
Glucose-Capillary: 116 mg/dL — ABNORMAL HIGH (ref 70–99)
Glucose-Capillary: 126 mg/dL — ABNORMAL HIGH (ref 70–99)
Glucose-Capillary: 134 mg/dL — ABNORMAL HIGH (ref 70–99)
Glucose-Capillary: 147 mg/dL — ABNORMAL HIGH (ref 70–99)
Glucose-Capillary: 151 mg/dL — ABNORMAL HIGH (ref 70–99)
Glucose-Capillary: 152 mg/dL — ABNORMAL HIGH (ref 70–99)

## 2022-04-17 LAB — BASIC METABOLIC PANEL
Anion gap: 14 (ref 5–15)
BUN: 22 mg/dL — ABNORMAL HIGH (ref 6–20)
CO2: 22 mmol/L (ref 22–32)
Calcium: 8.5 mg/dL — ABNORMAL LOW (ref 8.9–10.3)
Chloride: 91 mmol/L — ABNORMAL LOW (ref 98–111)
Creatinine, Ser: 0.74 mg/dL (ref 0.44–1.00)
GFR, Estimated: >60 mL/min (ref >60)
Glucose, Bld: 118 mg/dL — ABNORMAL HIGH (ref 70–99)
Potassium: 3.6 mmol/L (ref 3.5–5.1)
Sodium: 127 mmol/L — ABNORMAL LOW (ref 135–145)

## 2022-04-17 LAB — CBC
HCT: 39.2 % (ref 36.0–46.0)
Hemoglobin: 13.6 g/dL (ref 12.0–15.0)
MCH: 31.2 pg (ref 26.0–34.0)
MCHC: 34.7 g/dL (ref 30.0–36.0)
MCV: 89.9 fL (ref 80.0–100.0)
Platelets: 421 10*3/uL — ABNORMAL HIGH (ref 150–400)
RBC: 4.36 MIL/uL (ref 3.87–5.11)
RDW: 13.6 % (ref 11.5–15.5)
WBC: 10.5 10*3/uL (ref 4.0–10.5)
nRBC: 0 % (ref 0.0–0.2)

## 2022-04-17 LAB — PHOSPHORUS: Phosphorus: 3.3 mg/dL (ref 2.5–4.6)

## 2022-04-17 MED ORDER — POLYETHYLENE GLYCOL 3350 17 G PO PACK
17.0000 g | PACK | Freq: Two times a day (BID) | ORAL | Status: DC
  Administered 2022-04-17 – 2022-04-18 (×3): 17 g via ORAL
  Filled 2022-04-17 (×4): qty 1

## 2022-04-17 MED ORDER — SODIUM CHLORIDE 0.9 % IV SOLN: INTRAVENOUS | Status: DC

## 2022-04-17 NOTE — Progress Notes (Addendum)
Patient has had a challenging shift due to pain. Patient has required multiple doses of pain medication for relief.  Patient has not had a bowel movement or diarrhea this shift.  But patient has stated she has felt nauseated.  Patient also expressed concern over chronic medications and would like them ordered while here in the hospital.

## 2022-04-17 NOTE — Progress Notes (Signed)
Subjective: [name] tolerating clear liquids, though she still has mild nausea.  No bowel movement or flatus yet.  Objective: Vital Harris in last 24 hours: Temp:  [97.5 F (36.4 C)-98.6 F (37 C)] 97.7 F (36.5 C) (01/15 0420) Pulse Rate:  [99-126] 99 (01/15 0420) Resp:  [17-18] 18 (01/15 0420) BP: (124-150)/(84-92) 150/86 (01/15 0420) SpO2:  [94 %-95 %] 94 % (01/15 0420) Last BM Date : 04/16/22  Intake/Output from previous day: 01/14 0701 - 01/15 0700 In: 480 [P.O.:480] Out: 4 [Urine:4] Intake/Output this shift: No intake/output data recorded.  General appearance: alert, cooperative, and no distress Resp: clear to auscultation bilaterally Cardio: regular rate and rhythm, S1, S2 normal, no murmur, click, rub or gallop GI: Soft, nontender.  Slightly distended but not tense.  Incision healing well.  No bowel sounds appreciated.  Lab Results:  Recent Labs    04/16/22 0312 04/17/22 0443  WBC 10.4 10.5  HGB 14.3 13.6  HCT 41.1 39.2  PLT 394 421*   BMET Recent Labs    04/16/22 0312 04/17/22 0443  NA 128* 127*  K 3.7 3.6  CL 93* 91*  CO2 22 22  GLUCOSE 140* 118*  BUN 28* 22*  CREATININE 0.87 0.74  CALCIUM 8.5* 8.5*   PT/INR No results for input(s): "LABPROT", "INR" in the last 72 hours.  Studies/Results: CT Abdomen Pelvis W Contrast  Result Date: 04/15/2022 CLINICAL DATA:  Total colectomy with ileorectal anastomosis 04/10/2022 for colonic polyps. Patient presents with abdominal pain, nausea and vomiting for 1 day. EXAM: CT ABDOMEN AND PELVIS WITH CONTRAST TECHNIQUE: Multidetector CT imaging of the abdomen and pelvis was performed using the standard protocol following bolus administration of intravenous contrast. RADIATION DOSE REDUCTION: This exam was performed according to the departmental dose-optimization program which includes automated exposure control, adjustment of the mA and/or kV according to patient size and/or use of iterative reconstruction  technique. CONTRAST:  15m OMNIPAQUE IOHEXOL 300 MG/ML  SOLN COMPARISON:  07/05/2012 CT abdomen/pelvis. FINDINGS: Lower chest: Mild hypoventilatory changes at both lung bases. Hepatobiliary: Normal liver size. No liver mass. There is a new 6.4 x 3.7 x 3.4 cm extracapsular fluid collection between the right liver and posterior right hemidiaphragm with associated scalloping of the liver capsule (series 2/image 21). Cholelithiasis. No definite gallbladder wall thickening. Pancreas: Normal, with no mass or duct dilation. Spleen: Normal size. No mass. Adrenals/Urinary Tract: Normal adrenals. Scattered subcentimeter hypodense left renal cortical lesions are too small to characterize, for which no follow-up imaging is recommended. Otherwise normal kidneys, with no hydronephrosis. Prominently distended and otherwise normal bladder. Stomach/Bowel: Mild-to-moderate gastric distention with fluid and debris filled stomach. No gastric wall thickening. Interval near total colectomy with intact appearing ileocolic anastomosis in the lower abdomen to the right of midline. Generalized mild to moderate small bowel dilatation up to 5.2 cm diameter in the jejunum. No small bowel wall thickening or focal small bowel caliber transition. Unremarkable nondistended rectum. Vascular/Lymphatic: Atherosclerotic nonaneurysmal abdominal aorta. Patent portal, splenic, hepatic and renal veins. No pathologically enlarged lymph nodes in the abdomen or pelvis. Reproductive: Status post hysterectomy, with no abnormal findings at the vaginal cuff. No adnexal mass. Other: Trace ascitic fluid. Small amount of scattered retroperitoneal and intraperitoneal gas in lateral aspects of the bilateral abdomen. Small amount of scattered subcutaneous midline ventral abdominal wall gas. No additional focal fluid collections. Subcutaneous stimulator device in right buttock with single lead traversing right sacrum. Musculoskeletal: No aggressive appearing focal  osseous lesions. Marked lower lumbar degenerative disc disease.  IMPRESSION: 1. Interval near-total colectomy with intact appearing ileocolic anastomosis. Generalized mild to moderate small bowel dilatation, most compatible with postoperative adynamic ileus. 2. New 6.4 x 3.7 x 3.4 cm extracapsular fluid collection between the right liver and posterior right hemidiaphragm with associated scalloping of the liver capsule, cannot exclude a postoperative abscess. 3. Trace ascitic fluid. Small amount of scattered retroperitoneal and intraperitoneal gas in the lateral aspects of the bilateral abdomen. Small amount of scattered subcutaneous midline ventral abdominal wall gas. Findings are presumably due to recent surgery. 4. Cholelithiasis.  No biliary ductal dilatation. 5. Prominently distended and otherwise normal bladder. No hydronephrosis. 6.  Aortic Atherosclerosis (ICD10-I70.0). Electronically Signed   By: Ilona Sorrel M.D.   On: 04/15/2022 16:54    Anti-infectives: Anti-infectives (From admission, onward)    None       Assessment/Plan: Impression: Postoperative ileus, status post total colectomy.  Still hyponatremic.  Will increase MiraLAX to twice a day.  Will resume patient's home meds.  Awaiting return of bowel function.  Encourage ambulation.  LOS: 2 days    Alexis Harris 04/17/2022

## 2022-04-18 LAB — CBC
HCT: 37 % (ref 36.0–46.0)
Hemoglobin: 12.8 g/dL (ref 12.0–15.0)
MCH: 31.3 pg (ref 26.0–34.0)
MCHC: 34.6 g/dL (ref 30.0–36.0)
MCV: 90.5 fL (ref 80.0–100.0)
Platelets: 389 10*3/uL (ref 150–400)
RBC: 4.09 MIL/uL (ref 3.87–5.11)
RDW: 13.2 % (ref 11.5–15.5)
WBC: 9.4 10*3/uL (ref 4.0–10.5)
nRBC: 0 % (ref 0.0–0.2)

## 2022-04-18 LAB — GLUCOSE, CAPILLARY
Glucose-Capillary: 108 mg/dL — ABNORMAL HIGH (ref 70–99)
Glucose-Capillary: 109 mg/dL — ABNORMAL HIGH (ref 70–99)
Glucose-Capillary: 113 mg/dL — ABNORMAL HIGH (ref 70–99)
Glucose-Capillary: 124 mg/dL — ABNORMAL HIGH (ref 70–99)
Glucose-Capillary: 128 mg/dL — ABNORMAL HIGH (ref 70–99)
Glucose-Capillary: 129 mg/dL — ABNORMAL HIGH (ref 70–99)

## 2022-04-18 LAB — BASIC METABOLIC PANEL
Anion gap: 13 (ref 5–15)
BUN: 17 mg/dL (ref 6–20)
CO2: 22 mmol/L (ref 22–32)
Calcium: 8.2 mg/dL — ABNORMAL LOW (ref 8.9–10.3)
Chloride: 92 mmol/L — ABNORMAL LOW (ref 98–111)
Creatinine, Ser: 0.63 mg/dL (ref 0.44–1.00)
GFR, Estimated: >60 mL/min (ref >60)
Glucose, Bld: 109 mg/dL — ABNORMAL HIGH (ref 70–99)
Potassium: 3.3 mmol/L — ABNORMAL LOW (ref 3.5–5.1)
Sodium: 127 mmol/L — ABNORMAL LOW (ref 135–145)

## 2022-04-18 LAB — TSH: TSH: 1.423 u[IU]/mL (ref 0.350–4.500)

## 2022-04-18 LAB — PHOSPHORUS: Phosphorus: 2.8 mg/dL (ref 2.5–4.6)

## 2022-04-18 LAB — MAGNESIUM: Magnesium: 2 mg/dL (ref 1.7–2.4)

## 2022-04-18 MED ORDER — POTASSIUM CHLORIDE 10 MEQ/100ML IV SOLN
10.0000 meq | INTRAVENOUS | Status: AC
  Administered 2022-04-18 (×4): 10 meq via INTRAVENOUS
  Filled 2022-04-18 (×4): qty 100

## 2022-04-18 MED ORDER — TRAZODONE HCL 50 MG PO TABS
50.0000 mg | ORAL_TABLET | Freq: Every day | ORAL | Status: DC
  Administered 2022-04-18 – 2022-04-20 (×3): 50 mg via ORAL
  Filled 2022-04-18 (×3): qty 1

## 2022-04-18 MED ORDER — MAGNESIUM HYDROXIDE 400 MG/5ML PO SUSP
30.0000 mL | Freq: Every day | ORAL | Status: DC
  Administered 2022-04-18: 30 mL via ORAL
  Filled 2022-04-18 (×2): qty 30

## 2022-04-18 MED ORDER — PANTOPRAZOLE SODIUM 40 MG PO TBEC
40.0000 mg | DELAYED_RELEASE_TABLET | Freq: Every day | ORAL | Status: DC
  Administered 2022-04-18 – 2022-04-21 (×4): 40 mg via ORAL
  Filled 2022-04-18 (×4): qty 1

## 2022-04-18 MED ORDER — FLUVOXAMINE MALEATE 100 MG PO TABS
100.0000 mg | ORAL_TABLET | Freq: Two times a day (BID) | ORAL | Status: DC
  Administered 2022-04-18 – 2022-04-21 (×6): 100 mg via ORAL
  Filled 2022-04-18 (×13): qty 1

## 2022-04-18 MED ORDER — TOPIRAMATE 25 MG PO TABS
150.0000 mg | ORAL_TABLET | Freq: Every day | ORAL | Status: DC
  Administered 2022-04-18 – 2022-04-21 (×4): 150 mg via ORAL
  Filled 2022-04-18 (×4): qty 2

## 2022-04-18 NOTE — Plan of Care (Signed)
  Problem: Acute Rehab PT Goals(only PT should resolve) Goal: Patient Will Transfer Sit To/From Stand Outcome: Progressing Flowsheets (Taken 04/18/2022 1158) Patient will transfer sit to/from stand: with modified independence Goal: Pt Will Transfer Bed To Chair/Chair To Bed Outcome: Progressing Flowsheets (Taken 04/18/2022 1158) Pt will Transfer Bed to Chair/Chair to Bed: with modified independence Goal: Pt Will Ambulate Outcome: Progressing Flowsheets (Taken 04/18/2022 1158) Pt will Ambulate:  > 125 feet  with modified independence  with least restrictive assistive device Goal: Pt/caregiver will Perform Home Exercise Program Outcome: Progressing Flowsheets (Taken 04/18/2022 1158) Pt/caregiver will Perform Home Exercise Program:  For increased strengthening  For improved balance  Independently  11:59 AM, 04/18/22 Mearl Latin PT, DPT Physical Therapist at [facility]

## 2022-04-18 NOTE — Evaluation (Signed)
Physical Therapy Evaluation Patient Details Name: Alexis Harris MRN: [id number] DOB: 11/20/1970 Today's Date: 04/18/2022  History of Present Illness  Alexis Harris is a 52 y.o. female with medical history significant of hyperlipidemia, GERD, COPD, chronic pain, anxiety and depression who presents to the emergency department due to nausea, vomiting and abdominal pain which started yesterday in the morning (04/14/2022).  Patient underwent total colectomy and side-to-side anastomosis with ileum and rectosigmoid junction on 04/10/2022 due to multiple adenomatous polyps throughout the colon.  She did well after the surgery and was able to eat, drink, ambulate and had pain control with oral pain meds.  Patient was having bowel movements and was taking Imodium to control the number and character of the BMs prior to discharge.  She denies chest pain, shortness of breath, fever, chills, headache, blurry vision.   Clinical Impression  Patient limited for functional mobility as stated below secondary to nausea, slight BLE weakness, fatigue and impaired balance. Patient begins session seated in chair and is able to transfer to standing without assist but some unsteadiness upon standing. Patient ambulates with unsteady cadence for a few steps independently but gait/balance improves with use of RW. Patient ambulates with decreased cadence but without loss of balance. Patient returned to room at end of session with RN present.  Patient will benefit from continued physical therapy in hospital and recommended venue below to increase strength, balance, endurance for safe ADLs and gait.        Recommendations for follow up therapy are one component of a multi-disciplinary discharge planning process, led by the attending physician.  Recommendations may be updated based on patient status, additional functional criteria and insurance authorization.  Follow Up Recommendations Home health PT      Assistance Recommended at  Discharge PRN  Patient can return home with the following  Assistance with cooking/housework    Equipment Recommendations None recommended by PT  Recommendations for Other Services       Functional Status Assessment Patient has had a recent decline in their functional status and demonstrates the ability to make significant improvements in function in a reasonable and predictable amount of time.     Precautions / Restrictions Precautions Precautions: Fall Restrictions Weight Bearing Restrictions: No      Mobility  Bed Mobility               General bed mobility comments: seated in chair at beginning of session    Transfers Overall transfer level: Modified independent Equipment used: None Transfers: Sit to/from Stand Sit to Stand: Modified independent (Device/Increase time), Supervision           General transfer comment: able to power up without assist, slightly unsteady upon standing    Ambulation/Gait Ambulation/Gait assistance: Min guard Gait Distance (Feet): 160 Feet Assistive device: Rolling walker (2 wheels) Gait Pattern/deviations: Trunk flexed       General Gait Details: labored, slightly unsteady with RW but no loss of balance  Stairs            Wheelchair Mobility    Modified Rankin (Stroke Patients Only)       Balance Overall balance assessment: Needs assistance Sitting-balance support: Feet supported, No upper extremity supported Sitting balance-Leahy Scale: Good     Standing balance support: Bilateral upper extremity supported Standing balance-Leahy Scale: Good Standing balance comment: with RW  Pertinent Vitals/Pain Pain Assessment Pain Assessment: No/denies pain    Home Living Family/patient expects to be discharged to:: Private residence Living Arrangements: Alone Available Help at Discharge: Friend(s);Available PRN/intermittently Type of Home: Apartment Home Access: Level  entry       Home Layout: One level Home Equipment: Conservation officer, nature (2 wheels);Rollator (4 wheels);Cane - single point;Shower seat      Prior Function Prior Level of Function : Independent/Modified Independent             Mobility Comments: states household and short distance community ambulation with AD PRN ADLs Comments: independent with basic ADL     Hand Dominance        Extremity/Trunk Assessment   Upper Extremity Assessment Upper Extremity Assessment: Overall WFL for tasks assessed    Lower Extremity Assessment Lower Extremity Assessment: Overall WFL for tasks assessed    Cervical / Trunk Assessment Cervical / Trunk Assessment: Normal  Communication   Communication: No difficulties  Cognition Arousal/Alertness: Awake/alert Behavior During Therapy: WFL for tasks assessed/performed Overall Cognitive Status: Within Functional Limits for tasks assessed                                          General Comments      Exercises     Assessment/Plan    PT Assessment Patient needs continued PT services  PT Problem List Decreased strength;Decreased mobility;Decreased balance;Decreased activity tolerance       PT Treatment Interventions DME instruction;Therapeutic exercise;Gait training;Balance training;Stair training;Neuromuscular re-education;Functional mobility training;Therapeutic activities;Patient/family education    PT Goals (Current goals can be found in the Care Plan section)  Acute Rehab PT Goals Patient Stated Goal: return home to her cat PT Goal Formulation: With patient Time For Goal Achievement: 04/25/22 Potential to Achieve Goals: Good    Frequency Min 3X/week     Co-evaluation               AM-PAC PT "6 Clicks" Mobility  Outcome Measure Help needed turning from your back to your side while in a flat bed without using bedrails?: None Help needed moving from lying on your back to sitting on the side of a flat bed  without using bedrails?: A Little Help needed moving to and from a bed to a chair (including a wheelchair)?: A Little Help needed standing up from a chair using your arms (e.g., wheelchair or bedside chair)?: A Little Help needed to walk in hospital room?: A Little Help needed climbing 3-5 steps with a railing? : A Little 6 Click Score: 19    End of Session   Activity Tolerance: Patient tolerated treatment well Patient left: in chair;with call bell/phone within reach;with nursing/sitter in room Nurse Communication: Mobility status PT Visit Diagnosis: Unsteadiness on feet (R26.81);Other abnormalities of gait and mobility (R26.89)    Time: 0350-0938 PT Time Calculation (min) (ACUTE ONLY): 16 min   Charges:   PT Evaluation $PT Eval Low Complexity: 1 Low PT Treatments $Therapeutic Activity: 8-22 mins        11:58 AM, 04/18/22 Mearl Latin PT, DPT Physical Therapist at [facility]

## 2022-04-18 NOTE — Progress Notes (Addendum)
Just had medium, soft bm.  Still requiring zofran and dilaudid for pain drinking some of liquid tray, milk and tea.  Her friend is going to bring some mashed potatoes and Dr. Arnoldo Morale said ok for her to eat what she feels like

## 2022-04-18 NOTE — Progress Notes (Signed)
Patient has had challenging shift. Patient still has c/o abdominal pain and nausea and vomiting. Patient has had reflux and has not had a bowel movement. Patient c/o abdominal discomfort in her med abdomen.  Area does have hypoactive bowel sounds.  Patient also has concerns about home medications.

## 2022-04-18 NOTE — TOC Progression Note (Signed)
Transition of Care [facility]) - Progression Note    Patient Details  Name: RUDI KNIPPENBERG MRN: [id number] Date of Birth: Aug 18, 1970  Transition of Care [facility]) CM/SW [place], [place] Phone Number: 04/18/2022, 2:57 PM  Clinical Narrative:    CSW noted per chart review that pt is recommended for [facility] PT at D/C. CSW spoke with pt who states she is agreeable to [facility] being set up. Pt does not have an agency preference. CSW to send pts referral out for review. TOC to follow.     Barriers to Discharge: Continued Medical Work up  Expected Discharge Plan and Services                                               Social Determinants of Health (SDOH) Interventions SDOH Screenings   Food Insecurity: No Food Insecurity (04/16/2022)  Housing: Low Risk  (04/16/2022)  Transportation Needs: No Transportation Needs (04/16/2022)  Utilities: Not At Risk (04/16/2022)  Alcohol Screen: Low Risk  (08/25/2020)  Depression (PHQ2-9): Medium Risk (08/25/2020)  Financial Resource Strain: Medium Risk (08/25/2020)  Physical Activity: Inactive (08/25/2020)  Social Connections: Socially Isolated (08/25/2020)  Stress: Stress Concern Present (08/25/2020)  Tobacco Use: High Risk (04/16/2022)    Readmission Risk Interventions     No data to display

## 2022-04-18 NOTE — Progress Notes (Signed)
  Subjective: Patient has a decreased appetite.  No bowel movement or flatus yet.  Objective: Vital signs in last 24 hours: Temp:  [97.4 F (36.3 C)-98.6 F (37 C)] 98.2 F (36.8 C) (01/16 0416) Pulse Rate:  [95-100] 99 (01/16 0416) Resp:  [16-20] 16 (01/16 0416) BP: (135-158)/(86-99) 135/91 (01/16 0416) SpO2:  [94 %-95 %] 95 % (01/16 0416) Last BM Date : 04/16/22  Intake/Output from previous day: 01/15 0701 - 01/16 0700 In: 1217.5 [P.O.:580; I.V.:637.5] Out: -  Intake/Output this shift: No intake/output data recorded.  General appearance: alert, cooperative, and no distress Resp: clear to auscultation bilaterally Cardio: regular rate and rhythm, S1, S2 normal, no murmur, click, rub or gallop GI: Soft, incision healing well.  No significant bowel sounds appreciated.  Lab Results:  Recent Labs    04/17/22 0443 04/18/22 0405  WBC 10.5 9.4  HGB 13.6 12.8  HCT 39.2 37.0  PLT 421* 389   BMET Recent Labs    04/17/22 0443 04/18/22 0405  NA 127* 127*  K 3.6 3.3*  CL 91* 92*  CO2 22 22  GLUCOSE 118* 109*  BUN 22* 17  CREATININE 0.74 0.63  CALCIUM 8.5* 8.2*   PT/INR No results for input(s): "LABPROT", "INR" in the last 72 hours.  Studies/Results: No results found.  Anti-infectives: Anti-infectives (From admission, onward)    None       Assessment/Plan: Impression: Postoperative ileus.  Awaiting return of bowel function.  Still with mild hyponatremia.  Now with hypokalemia.  I am supplementing both.  Will add milk of magnesia daily.  I have restarted her antidepressant medications.  Patient needs to ambulate.  LOS: 3 days    [name] 04/18/2022

## 2022-04-19 LAB — GLUCOSE, CAPILLARY
Glucose-Capillary: 100 mg/dL — ABNORMAL HIGH (ref 70–99)
Glucose-Capillary: 111 mg/dL — ABNORMAL HIGH (ref 70–99)
Glucose-Capillary: 111 mg/dL — ABNORMAL HIGH (ref 70–99)
Glucose-Capillary: 115 mg/dL — ABNORMAL HIGH (ref 70–99)
Glucose-Capillary: 115 mg/dL — ABNORMAL HIGH (ref 70–99)
Glucose-Capillary: 122 mg/dL — ABNORMAL HIGH (ref 70–99)
Glucose-Capillary: 95 mg/dL (ref 70–99)

## 2022-04-19 LAB — BASIC METABOLIC PANEL
Anion gap: 11 (ref 5–15)
BUN: 14 mg/dL (ref 6–20)
CO2: 20 mmol/L — ABNORMAL LOW (ref 22–32)
Calcium: 8.2 mg/dL — ABNORMAL LOW (ref 8.9–10.3)
Chloride: 97 mmol/L — ABNORMAL LOW (ref 98–111)
Creatinine, Ser: 0.58 mg/dL (ref 0.44–1.00)
GFR, Estimated: >60 mL/min (ref >60)
Glucose, Bld: 101 mg/dL — ABNORMAL HIGH (ref 70–99)
Potassium: 3.5 mmol/L (ref 3.5–5.1)
Sodium: 128 mmol/L — ABNORMAL LOW (ref 135–145)

## 2022-04-19 LAB — CBC
HCT: 35.4 % — ABNORMAL LOW (ref 36.0–46.0)
Hemoglobin: 12 g/dL (ref 12.0–15.0)
MCH: 30.8 pg (ref 26.0–34.0)
MCHC: 33.9 g/dL (ref 30.0–36.0)
MCV: 91 fL (ref 80.0–100.0)
Platelets: 422 10*3/uL — ABNORMAL HIGH (ref 150–400)
RBC: 3.89 MIL/uL (ref 3.87–5.11)
RDW: 13.3 % (ref 11.5–15.5)
WBC: 10.5 10*3/uL (ref 4.0–10.5)
nRBC: 0 % (ref 0.0–0.2)

## 2022-04-19 LAB — T4: T4, Total: 12.6 ug/dL — ABNORMAL HIGH (ref 4.5–12.0)

## 2022-04-19 LAB — T3 UPTAKE: T3 Uptake Ratio: 33 % (ref 24–39)

## 2022-04-19 LAB — PHOSPHORUS: Phosphorus: 2.9 mg/dL (ref 2.5–4.6)

## 2022-04-19 LAB — MAGNESIUM: Magnesium: 2 mg/dL (ref 1.7–2.4)

## 2022-04-19 NOTE — Progress Notes (Signed)
Patient had multiple loose bowel movements throughout this shift. Still complaining of pain intermittently, dilaudid administered for pain. Patient complained of nausea x2 this shift, Zofran administered. Patient does not express any needs at this time.

## 2022-04-19 NOTE — TOC Transition Note (Signed)
Transition of Care [place]) - CM/SW Discharge Note   Patient Details  Name: Alexis Harris MRN: [id number] Date of Birth: 05/09/1970  Transition of Care [facility]) CM/SW Contact:  Iona Beard, [place] Phone Number: 04/19/2022, 11:32 AM  Clinical Narrative:    CSW spoke to [place] with [place] who states they can accept [facility] PT referral. CSW visited with pt in room to provide update. TOC consult was place for abuse assessment. CSW spoke with pt about this. Pt states that she feels safe in her home. She states in the past she felt unsafe but this is no longer an issue. CSW requested that pt place [facility] PT orders. TOC signing off.   Final next level of care: Home w Home Health Services Barriers to Discharge: Barriers Resolved   Patient Goals and CMS Choice CMS Medicare.gov Compare Post Acute Care list provided to:: Patient Choice offered to / list presented to : Patient  Discharge Placement                         Discharge Plan and Services Additional resources added to the After Visit Summary for                            [facility] Arranged: PT [facility] Agency: [place] Date [place]: 04/19/22   Representative spoke with at [place]: [place] Determinants of Health ([place]) Interventions SDOH Screenings   Food Insecurity: No Food Insecurity (04/16/2022)  Housing: Low Risk  (04/16/2022)  Transportation Needs: No Transportation Needs (04/16/2022)  Utilities: Not At Risk (04/16/2022)  Alcohol Screen: Low Risk  (08/25/2020)  Depression (PHQ2-9): Medium Risk (08/25/2020)  Financial Resource Strain: Medium Risk (08/25/2020)  Physical Activity: Inactive (08/25/2020)  Social Connections: Socially Isolated (08/25/2020)  Stress: Stress Concern Present (08/25/2020)  Tobacco Use: High Risk (04/16/2022)     Readmission Risk Interventions     No data to display

## 2022-04-19 NOTE — Progress Notes (Signed)
  Subjective: Patient has had multiple bowel movements over the past 12 hours.  She is tolerating a soft diet better.  Less nausea noted.  No emesis noted.  No significant abdominal pain noted.  Objective: Vital signs in last 24 hours: Temp:  [97.9 F (36.6 C)-98.4 F (36.9 C)] 97.9 F (36.6 C) (01/17 0414) Pulse Rate:  [97-102] 97 (01/17 0414) Resp:  [17-20] 18 (01/17 0414) BP: (142-150)/(80-94) 142/80 (01/17 0414) SpO2:  [94 %-97 %] 94 % (01/17 0414) Last BM Date : 04/18/22  Intake/Output from previous day: 01/16 0701 - 01/17 0700 In: 2885.2 [P.O.:720; I.V.:1839; IV Piggyback:326.1] Out: -  Intake/Output this shift: No intake/output data recorded.  General appearance: alert, cooperative, and no distress Resp: clear to auscultation bilaterally Cardio: regular rate and rhythm, S1, S2 normal, no murmur, click, rub or gallop GI: Soft, nontender, nondistended.  Incision healing well.  Bowel sounds present.  Lab Results:  Recent Labs    04/18/22 0405 04/19/22 0304  WBC 9.4 10.5  HGB 12.8 12.0  HCT 37.0 35.4*  PLT 389 422*   BMET Recent Labs    04/18/22 0405 04/19/22 0304  NA 127* 128*  K 3.3* 3.5  CL 92* 97*  CO2 22 20*  GLUCOSE 109* 101*  BUN 17 14  CREATININE 0.63 0.58  CALCIUM 8.2* 8.2*   PT/INR No results for input(s): "LABPROT", "INR" in the last 72 hours.  Studies/Results: No results found.  Anti-infectives: Anti-infectives (From admission, onward)    None       Assessment/Plan: Impression: Postoperative ileus resolving.  Still with mild hyponatremia.  Hopefully her sodium will increase with Hep-Lock in her IV.  Will stop MiraLAX and milk of magnesia.  Monitor frequency of bowel movements over the next 24 hours.  Anticipate discharge in the next 24 to 48 hours.  LOS: 4 days    [name] 04/19/2022

## 2022-04-19 NOTE — Progress Notes (Signed)
Patient alert and oriented. Able to make needs known, she complain of having multiple loose stools, I did observe a small amount in floor that looked liquidy, Dr. Arnoldo Morale is aware. Patient tolerating food but has c/o nausea x 1 in which PRN zofran IV was administered. Peri care provided to patient and brought her 2 cups of ice water. She is up in chair at this time, Call bell and hydration within reach , replaced nonskid socks on patients bilateral feet

## 2022-04-20 ENCOUNTER — Encounter: Payer: 59 | Admitting: General Surgery

## 2022-04-20 LAB — CULTURE, BLOOD (ROUTINE X 2)
Culture: NO GROWTH
Culture: NO GROWTH
Special Requests: ADEQUATE
Special Requests: ADEQUATE

## 2022-04-20 LAB — GLUCOSE, CAPILLARY
Glucose-Capillary: 102 mg/dL — ABNORMAL HIGH (ref 70–99)
Glucose-Capillary: 109 mg/dL — ABNORMAL HIGH (ref 70–99)
Glucose-Capillary: 122 mg/dL — ABNORMAL HIGH (ref 70–99)
Glucose-Capillary: 90 mg/dL (ref 70–99)
Glucose-Capillary: 94 mg/dL (ref 70–99)

## 2022-04-20 LAB — BASIC METABOLIC PANEL
Anion gap: 10 (ref 5–15)
BUN: 11 mg/dL (ref 6–20)
CO2: 21 mmol/L — ABNORMAL LOW (ref 22–32)
Calcium: 8.4 mg/dL — ABNORMAL LOW (ref 8.9–10.3)
Chloride: 98 mmol/L (ref 98–111)
Creatinine, Ser: 0.71 mg/dL (ref 0.44–1.00)
GFR, Estimated: >60 mL/min (ref >60)
Glucose, Bld: 101 mg/dL — ABNORMAL HIGH (ref 70–99)
Potassium: 3.1 mmol/L — ABNORMAL LOW (ref 3.5–5.1)
Sodium: 129 mmol/L — ABNORMAL LOW (ref 135–145)

## 2022-04-20 MED ORDER — LOPERAMIDE HCL 2 MG PO CAPS
2.0000 mg | ORAL_CAPSULE | Freq: Two times a day (BID) | ORAL | Status: DC
  Administered 2022-04-20 – 2022-04-21 (×3): 2 mg via ORAL
  Filled 2022-04-20 (×3): qty 1

## 2022-04-20 MED ORDER — OXYCODONE HCL 5 MG PO TABS
5.0000 mg | ORAL_TABLET | ORAL | Status: DC | PRN
  Administered 2022-04-20 – 2022-04-21 (×3): 5 mg via ORAL
  Filled 2022-04-20 (×3): qty 1

## 2022-04-20 MED ORDER — POTASSIUM CHLORIDE 20 MEQ PO PACK
60.0000 meq | PACK | Freq: Two times a day (BID) | ORAL | Status: AC
  Administered 2022-04-20 (×2): 60 meq via ORAL
  Filled 2022-04-20 (×2): qty 3

## 2022-04-20 MED ORDER — HYDROMORPHONE HCL 1 MG/ML IJ SOLN
0.5000 mg | INTRAMUSCULAR | Status: DC | PRN
  Administered 2022-04-20: 0.5 mg via INTRAVENOUS
  Filled 2022-04-20: qty 0.5

## 2022-04-20 MED ORDER — LOPERAMIDE HCL 2 MG PO CAPS: 2.0000 mg | ORAL_CAPSULE | ORAL | Status: DC | PRN

## 2022-04-20 NOTE — Progress Notes (Signed)
[facility] Progress Note     Subjective: Ileus now over compensated for with excessive diarrhea. She is unable to even ambulate without diarrhea.   Otherwise eating and no nausea.   Objective: Vital signs in last 24 hours: Temp:  [97.9 F (36.6 C)-98.8 F (37.1 C)] 98.8 F (37.1 C) (01/18 0333) Pulse Rate:  [97-110] 97 (01/18 0333) Resp:  [16-20] 16 (01/18 0333) BP: (118-143)/(76-90) 118/76 (01/18 0333) SpO2:  [95 %-97 %] 95 % (01/18 0333) Last BM Date : 04/18/22  Intake/Output from previous day: 01/17 0701 - 01/18 0700 In: 1200 [P.O.:1200] Out: -  Intake/Output this shift: Total I/O In: 480 [P.O.:480] Out: -   General appearance: alert and no distress GI: midline with staples in place, no erythema or drainage   Lab Results:  Recent Labs    04/18/22 0405 04/19/22 0304  WBC 9.4 10.5  HGB 12.8 12.0  HCT 37.0 35.4*  PLT 389 422*   BMET Recent Labs    04/19/22 0304 04/20/22 0315  NA 128* 129*  K 3.5 3.1*  CL 97* 98  CO2 20* 21*  GLUCOSE 101* 101*  BUN 14 11  CREATININE 0.58 0.71  CALCIUM 8.2* 8.4*   PT/INR No results for input(s): "LABPROT", "INR" in the last 72 hours.  Studies/Results: No results found.  Anti-infectives: Anti-infectives (From admission, onward)    None       Assessment/Plan: Patient with ileus likely from imodium after her total colectomy. CT reassuring, no leak or infection. Admitted for rehydration and monitoring to get her bowel going again.   She is now with diarrhea again. Discussed that her Bms are going to be challenging to handle and that we will do less imodium this time but this is going to be a constant changing variable. Discussed getting in some sodium with gatorade as she is drinking a lot of water. Discussed staying overnight to make sure the Bms slow down some.   Roxicodone for pain BMP tomorrow Soft diet Imodium with breakfast and dinner and PRN  Hopefully home tomorrow Replaced K She  will get someone to bring her some gatorade    LOS: 5 days    Virl Cagey 04/20/2022

## 2022-04-20 NOTE — TOC Progression Note (Signed)
Transition of Care [facility]) - Progression Note    Patient Details  Name: MYLENE BOW MRN: [id number] Date of Birth: [birth year]  Transition of Care [facility]) CM/SW [place], [place] Phone Number: 04/20/2022, 12:46 PM  Clinical Narrative:    CSW updated by RN that pt and daughter are requesting [facility] services through [facility] in [place]. CSW spoke with [place] who states that they only provide outpatient drug and mental health therapy. CSW updated RN of this. Pt has been set up with [facility] for [facility] PT. TOC to follow.     Barriers to Discharge: Barriers Resolved  Expected Discharge Plan and Services                                   HH Arranged: PT [facility] Agency: [place] Date [place]: 04/19/22   Representative spoke with at [place]: [place] Determinants of Health (SDOH) Interventions SDOH Screenings   Food Insecurity: No Food Insecurity (04/16/2022)  Housing: Low Risk  (04/16/2022)  Transportation Needs: No Transportation Needs (04/16/2022)  Utilities: Not At Risk (04/16/2022)  Alcohol Screen: Low Risk  (08/25/2020)  Depression (PHQ2-9): Medium Risk (08/25/2020)  Financial Resource Strain: Medium Risk (08/25/2020)  Physical Activity: Inactive (08/25/2020)  Social Connections: Socially Isolated (08/25/2020)  Stress: Stress Concern Present (08/25/2020)  Tobacco Use: High Risk (04/16/2022)    Readmission Risk Interventions     No data to display

## 2022-04-20 NOTE — Progress Notes (Signed)
Pt has c/o pain throughout this shift, oxycodone given. Pt also has had multiple loose stools with imodium given as prescribed. Will continue to monitor.

## 2022-04-21 DIAGNOSIS — E876 Hypokalemia: Secondary | ICD-10-CM | POA: Insufficient documentation

## 2022-04-21 DIAGNOSIS — E871 Hypo-osmolality and hyponatremia: Secondary | ICD-10-CM | POA: Insufficient documentation

## 2022-04-21 LAB — BASIC METABOLIC PANEL
Anion gap: 12 (ref 5–15)
BUN: 9 mg/dL (ref 6–20)
CO2: 20 mmol/L — ABNORMAL LOW (ref 22–32)
Calcium: 8.6 mg/dL — ABNORMAL LOW (ref 8.9–10.3)
Chloride: 99 mmol/L (ref 98–111)
Creatinine, Ser: 0.65 mg/dL (ref 0.44–1.00)
GFR, Estimated: >60 mL/min (ref >60)
Glucose, Bld: 85 mg/dL (ref 70–99)
Potassium: 4 mmol/L (ref 3.5–5.1)
Sodium: 131 mmol/L — ABNORMAL LOW (ref 135–145)

## 2022-04-21 LAB — GLUCOSE, CAPILLARY
Glucose-Capillary: 115 mg/dL — ABNORMAL HIGH (ref 70–99)
Glucose-Capillary: 110 mg/dL — ABNORMAL HIGH (ref 70–99)

## 2022-04-21 MED ORDER — LOPERAMIDE HCL 2 MG PO CAPS: 2.0000 mg | ORAL_CAPSULE | Freq: Two times a day (BID) | ORAL | 3 refills | Status: DC

## 2022-04-21 MED ORDER — PROCHLORPERAZINE MALEATE 5 MG PO TABS: 5.0000 mg | ORAL_TABLET | Freq: Four times a day (QID) | ORAL | 1 refills | Status: DC | PRN

## 2022-04-21 MED ORDER — OXYCODONE HCL 5 MG PO TABS: 5.0000 mg | ORAL_TABLET | ORAL | 0 refills | Status: DC | PRN

## 2022-04-21 NOTE — Progress Notes (Signed)
Physical Therapy Treatment Patient Details Name: Alexis Harris MRN: [id number] DOB: 1970-12-01 Today's Date: 04/21/2022   History of Present Illness Alexis Harris is a 52 y.o. female with medical history significant of hyperlipidemia, GERD, COPD, chronic pain, anxiety and depression who presents to the emergency department due to nausea, vomiting and abdominal pain which started yesterday in the morning (04/14/2022).  Patient underwent total colectomy and side-to-side anastomosis with ileum and rectosigmoid junction on 04/10/2022 due to multiple adenomatous polyps throughout the colon.  She did well after the surgery and was able to eat, drink, ambulate and had pain control with oral pain meds.  Patient was having bowel movements and was taking Imodium to control the number and character of the BMs prior to discharge.  She denies chest pain, shortness of breath, fever, chills, headache, blurry vision.    PT Comments    Patient does not require assist with bed mobility to transition to seated EOB. She demonstrates good sitting balance and is able to transfer to standing without AD but some unsteadiness upon standing. Patient ambulates with RW and slow cadence but without loss of balance. Patient fatigued following ambulation and ends session seated in chair. Patient will benefit from continued skilled physical therapy in hospital and recommended venue below to increase strength, balance, endurance for safe ADLs and gait.    Recommendations for follow up therapy are one component of a multi-disciplinary discharge planning process, led by the attending physician.  Recommendations may be updated based on patient status, additional functional criteria and insurance authorization.  Follow Up Recommendations  Home health PT     Assistance Recommended at Discharge PRN  Patient can return home with the following Assistance with cooking/housework   Equipment Recommendations  None recommended by PT     Recommendations for Other Services       Precautions / Restrictions Precautions Precautions: Fall Restrictions Weight Bearing Restrictions: No     Mobility  Bed Mobility Overal bed mobility: Modified Independent                  Transfers Overall transfer level: Modified independent Equipment used: None Transfers: Sit to/from Stand Sit to Stand: Modified independent (Device/Increase time), Supervision           General transfer comment: able to power up without assist, slightly unsteady upon standing    Ambulation/Gait Ambulation/Gait assistance: Min guard Gait Distance (Feet): 150 Feet Assistive device: Rolling walker (2 wheels) Gait Pattern/deviations: Trunk flexed       General Gait Details: labored, slightly unsteady with RW but no loss of balance   Stairs             Wheelchair Mobility    Modified Rankin (Stroke Patients Only)       Balance Overall balance assessment: Needs assistance Sitting-balance support: Feet supported, No upper extremity supported Sitting balance-Leahy Scale: Good     Standing balance support: Bilateral upper extremity supported Standing balance-Leahy Scale: Good Standing balance comment: with RW                            Cognition Arousal/Alertness: Awake/alert Behavior During Therapy: WFL for tasks assessed/performed Overall Cognitive Status: Within Functional Limits for tasks assessed  Exercises      General Comments        Pertinent Vitals/Pain Pain Assessment Pain Assessment: Faces Faces Pain Scale: Hurts a little bit Pain Location: stomach and back Pain Descriptors / Indicators: Aching Pain Intervention(s): Limited activity within patient's tolerance, Monitored during session, Repositioned    Home Living                          Prior Function            PT Goals (current goals can now be found in the  care plan section) Acute Rehab PT Goals Patient Stated Goal: return home to her cat PT Goal Formulation: With patient Time For Goal Achievement: 04/25/22 Potential to Achieve Goals: Good Progress towards PT goals: Progressing toward goals    Frequency    Min 3X/week      PT Plan Current plan remains appropriate    Co-evaluation              AM-PAC PT "6 Clicks" Mobility   Outcome Measure  Help needed turning from your back to your side while in a flat bed without using bedrails?: None Help needed moving from lying on your back to sitting on the side of a flat bed without using bedrails?: None Help needed moving to and from a bed to a chair (including a wheelchair)?: A Little Help needed standing up from a chair using your arms (e.g., wheelchair or bedside chair)?: A Little Help needed to walk in hospital room?: A Little Help needed climbing 3-5 steps with a railing? : A Little 6 Click Score: 20    End of Session Equipment Utilized During Treatment: Gait belt Activity Tolerance: Patient tolerated treatment well Patient left: in chair;with call bell/phone within reach Nurse Communication: Mobility status PT Visit Diagnosis: Unsteadiness on feet (R26.81);Other abnormalities of gait and mobility (R26.89)     Time: 0923-3007 PT Time Calculation (min) (ACUTE ONLY): 9 min  Charges:  $Therapeutic Activity: 8-22 mins                     9:26 AM, 04/21/22 Mearl Latin PT, DPT Physical Therapist at [facility]

## 2022-04-21 NOTE — Discharge Summary (Signed)
Physician Discharge Summary  Patient ID: Alexis Harris MRN: [id number] DOB/AGE: February 20, 1971 52 y.o.  Admit date: 04/15/2022 Discharge date: 04/21/2022  Admission Diagnoses: Ileus, Nausea/ vomiting s/p total colectomy week prior   Discharge Diagnoses:  Principal Problem:   Adynamic ileus ([place]) Active Problems:   GERD (gastroesophageal reflux disease)   Nausea & vomiting   Abdominal pain   Leukocytosis   Thrombocytosis   Type 2 diabetes mellitus with hyperglycemia (HCC)   Prolonged QT interval   Mixed hyperlipidemia   COPD (chronic obstructive pulmonary disease) (HCC)   Anxiety and depression   Hyponatremia   Hypokalemia   Discharged Condition: good  Hospital Course: Alexis Harris is a 52 yo who was discharged after a total colectomy eating, having Bms and having control of her Bms with imodium. She was feeling well but then returned to the ED within 48 hours with complaints of nausea, vomiting and no Bms. She effectively developed an adynamic ileus the imodium and had to be brought into the hospital to monitor and get her bowels moving again. After some time and laxative use she started having Bms again.  She had a CT that demonstrated no leak or signs of infection. Her WBC was reassuring. She had some electrolyte abnormalities including hyponatremia and hypokalemia that were resolved or improving before her discharge.  She was back to having multiple Bms and only doing imodium with breakfast and supper and at night if she requested it.  She understands she will have to taper and increase/decrease this as she needs depending on her stool output. She understands she needs to stay hydrated.    She had physical therapy while in the hospital and they recommended [facility] which has been set up.   She was noted to have a prolonged QTC on admission with the electrolyte abnormalities, so I did not discharge her on any zofran. For now she can have compazine for nausea if needed.   Consults:   Hospitalist admission- taken over by surgery; Physical therapy   Significant Diagnostic Studies:   Latest Reference Range & Units 04/17/22 04:43 04/18/22 04:05 04/19/22 03:04 04/20/22 03:15 04/21/22 03:13  Sodium 135 - 145 mmol/L 127 (L) 127 (L) 128 (L) 129 (L) 131 (L)  Potassium 3.5 - 5.1 mmol/L 3.6 3.3 (L) 3.5 3.1 (L) 4.0  Chloride 98 - 111 mmol/L 91 (L) 92 (L) 97 (L) 98 99  CO2 22 - 32 mmol/L '22 22 20 '$ (L) 21 (L) 20 (L)  Glucose 70 - 99 mg/dL 118 (H) 109 (H) 101 (H) 101 (H) 85  BUN 6 - 20 mg/dL 22 (H) '17 14 11 9  '$ Creatinine 0.44 - 1.00 mg/dL 0.74 0.63 0.58 0.71 0.65  Calcium 8.9 - 10.3 mg/dL 8.5 (L) 8.2 (L) 8.2 (L) 8.4 (L) 8.6 (L)  Anion gap 5 - '15  14 13 11 10 12  '$ Phosphorus 2.5 - 4.6 mg/dL 3.3 2.8 2.9    Magnesium 1.7 - 2.4 mg/dL 2.2 2.0 2.0    GFR, Estimated >60 mL/min >60 >60 >60 >60 >60  WBC 4.0 - 10.5 K/uL 10.5 9.4 10.5    RBC 3.87 - 5.11 MIL/uL 4.36 4.09 3.89    Hemoglobin 12.0 - 15.0 g/dL 13.6 12.8 12.0    HCT 36.0 - 46.0 % 39.2 37.0 35.4 (L)    MCV 80.0 - 100.0 fL 89.9 90.5 91.0    MCH 26.0 - 34.0 pg 31.2 31.3 30.8    MCHC 30.0 - 36.0 g/dL 34.7 34.6 33.9  RDW 11.5 - 15.5 % 13.6 13.2 13.3    Platelets 150 - 400 K/uL 421 (H) 389 422 (H)    nRBC 0.0 - 0.2 % 0.0 0.0 0.0    (L): Data is abnormally low (H): Data is abnormally high  Treatments: IV hydration, analgesia: Dilaudid and roxicodone, and therapies: PT  Discharge Exam: Blood pressure 134/79, pulse 97, temperature (!) 97.1 F (36.2 C), resp. rate 16, height '5\' 4"'$  (1.626 m), weight 83 kg, SpO2 95 %. General appearance: alert and no distress Resp: normal work of breathing GI: soft, nondistended, midline healing, no erythema or drainage, staples removed, steri strips placed   Disposition: Discharge disposition: 01-Home or Self Care       Discharge Instructions     Call MD for:  difficulty breathing, headache or visual disturbances   Complete by: As directed    Call MD for:  extreme fatigue   Complete by: As  directed    Call MD for:  persistant dizziness or light-headedness   Complete by: As directed    Call MD for:  persistant nausea and vomiting   Complete by: As directed    Call MD for:  redness, tenderness, or signs of infection (pain, swelling, redness, odor or green/yellow discharge around incision site)   Complete by: As directed    Call MD for:  severe uncontrolled pain   Complete by: As directed    Call MD for:  temperature >100.4   Complete by: As directed    Increase activity slowly   Complete by: As directed       Allergies as of 04/21/2022       Reactions   Advair Diskus [fluticasone-salmeterol] Anaphylaxis   Other Hives   wool   Codeine Nausea And Vomiting   Penicillins Rash        Medication List     STOP taking these medications    ondansetron 4 MG tablet Commonly known as: ZOFRAN       TAKE these medications    albuterol 108 (90 Base) MCG/ACT inhaler Commonly known as: VENTOLIN HFA Inhale 2 puffs into the lungs every 6 (six) hours as needed for wheezing or shortness of breath.   ARIPiprazole 10 MG tablet Commonly known as: ABILIFY Take 10 mg by mouth in the morning.   atorvastatin 20 MG tablet Commonly known as: LIPITOR Take 20 mg by mouth at bedtime.   buprenorphine 10 MCG/HR Ptwk Commonly known as: BUTRANS Place 1 patch onto the skin every Sunday.   buPROPion 150 MG 12 hr tablet Commonly known as: WELLBUTRIN SR Take 150 mg by mouth 2 (two) times daily.   busPIRone 15 MG tablet Commonly known as: BUSPAR Take 15 mg by mouth 3 (three) times daily.   cetirizine 10 MG tablet Commonly known as: ZYRTEC Take 10 mg by mouth in the morning.   CINNAMON PO Take 2 tablets by mouth in the morning.   cyclobenzaprine 10 MG tablet Commonly known as: FLEXERIL Take 10 mg by mouth 3 (three) times daily as needed for muscle spasms.   Dulera 100-5 MCG/ACT Aero Generic drug: mometasone-formoterol Inhale 2 puffs into the lungs 2 (two) times  daily.   estradiol 0.1 MG/GM vaginal cream Commonly known as: ESTRACE Place 1 g vaginally 3 (three) times a week.   famotidine 20 MG tablet Commonly known as: PEPCID Take 1 tablet (20 mg total) by mouth at bedtime.   fluvoxaMINE 100 MG tablet Commonly known as: LUVOX Take 150 mg by mouth  2 (two) times daily.   furosemide 20 MG tablet Commonly known as: LASIX Take 20 mg by mouth in the morning and at bedtime.   gabapentin 400 MG capsule Commonly known as: NEURONTIN Take 400 mg by mouth 3 (three) times daily.   GREEN TEA PO Take 1 tablet by mouth in the morning. MEGA-T Green Tea FAT BURNER   hydrOXYzine 50 MG tablet Commonly known as: ATARAX Take 50-100 mg by mouth at bedtime as needed (sleep).   lamoTRIgine 200 MG tablet Commonly known as: LAMICTAL Take 200 mg by mouth every evening.   loperamide 2 MG capsule Commonly known as: IMODIUM Take 1 capsule (2 mg total) by mouth 2 (two) times daily before a meal. What changed: when to take this   lubiprostone 24 MCG capsule Commonly known as: AMITIZA TAKE 1 CAPSULE (24 MCG TOTAL) BY MOUTH 2 (TWO) TIMES DAILY WITH A MEAL.   metFORMIN 500 MG tablet Commonly known as: GLUCOPHAGE Take 500 mg by mouth 2 (two) times daily.   montelukast 10 MG tablet Commonly known as: SINGULAIR Take 10 mg by mouth at bedtime.   multivitamin with minerals Tabs tablet Take 1 tablet by mouth in the morning.   Myrbetriq 50 MG Tb24 tablet Generic drug: mirabegron ER Take 50 mg by mouth in the morning.   Omega 3 1200 MG Caps Take 1,200 mg by mouth in the morning.   oxyCODONE 5 MG immediate release tablet Commonly known as: Oxy IR/ROXICODONE Take 1 tablet (5 mg total) by mouth every 4 (four) hours as needed for severe pain or breakthrough pain. Do not use with your pain patch   oxymetazoline 0.05 % nasal spray Commonly known as: AFRIN Place 1 spray into both nostrils 2 (two) times daily as needed for congestion.   pantoprazole 40 MG  tablet Commonly known as: Protonix Take 1 tablet (40 mg total) by mouth 2 (two) times daily before a meal.   polyethylene glycol powder 17 GM/SCOOP powder Commonly known as: GLYCOLAX/MIRALAX Take 17 g by mouth in the morning. For constipation   prochlorperazine 5 MG tablet Commonly known as: COMPAZINE Take 1 tablet (5 mg total) by mouth every 6 (six) hours as needed for nausea or vomiting.   senna-docusate 8.6-50 MG tablet Commonly known as: Senokot-S Take 2 tablets by mouth in the morning.   SINUS + HEADACHE PO Take 2 tablets by mouth daily as needed (sinus headaches).   SUMAtriptan 100 MG tablet Commonly known as: IMITREX Take 100 mg by mouth every 2 (two) hours as needed for migraine. *TAKE ONE TABLET BY MOUTH AT ONSET OF MIGRAINE. IF MIGRAINE PERSISTS AFTER 2 HOURS, TAKE ONE ADDITIONAL TABLET. *DO NOT EXCEED MORE THAN 2 TABLETS IN A 24 HOUR PERIOD AND DO NOT USE NO MORE THAN 5 TABLETS WITHIN THE PERIOD OF 1 WEEK*   topiramate 50 MG tablet Commonly known as: TOPAMAX Take 150 mg by mouth in the morning.   traZODone 50 MG tablet Commonly known as: DESYREL Take 50-150 mg by mouth at bedtime.   UNABLE TO FIND Apply topically as needed. Med Name: **HOME TENS UNIT: FOR LOWER CHRONIC BACK PAIN/BACK PROBLEMS   valACYclovir 500 MG tablet Commonly known as: VALTREX Take 500 mg by mouth 2 (two) times daily as needed (outbreaks).   vitamin C 1000 MG tablet Take 1,000 mg by mouth in the morning.        Follow-up Information     [facility].   Specialty: [place]  Why: Agency will call to set up appointments. Contact information: [address] [place] [address] [phone]         Virl Cagey, MD Follow up on 04/26/2022.   Specialty: General Surgery Contact information: [address] [place] [address] [phone]                 Future Appointments  Date Time Provider [place]   04/26/2022 10:00 AM Virl Cagey, MD RS-RS None  09/07/2022  3:00 PM Erenest Rasher, PA-C [facility]    Instructions for Discharge:  Prolonged QTC: On your EKG (heart rhythm) you have what is called a prolonged QTC.  This puts you are at risk for arrhythmias (irregular heart rhythms) with certain medications.  We cannot prescribe the zofran for nausea as an outpatient and had to change this to compazine.  This QTC prolongation (being longer than normal) is likely from your issues with the electrolytes (salts in the blood) and the diarrhea and may get better/ resolve on its own.  For now we need to hold on any Zofran or medication that puts you at risk of this QTC getting even longer.  I have attached information about Long QT in your discharge instructions.   Wound:  Steristrips will peel off in the next 5-7 days. You can remove them once they are peeling off. It is ok to shower. Pat the area dry.  No heavy lifting > 10 lbs, excessive bending, pushing, pulling, or squatting for 6-8 weeks after surgery.  You may shower. But do not submerge in water, tub, etc.   Diet:  As tolerated.  Start to add back food that is more solid and high in fiber to help keep you from having diarrhea. Try small meals more frequently. Drink plenty of water and stay hydrated.   Diet Changes that can help thicken your stool: - Chew food well.  - Eat low sugar foods and drinks.  - Eat salty foods and add salt to meals and snacks.  - Eat smaller more frequent meals and snacks.  - Drink fluids  hour before or after meals, not with food.  - Avoid alcohol and caffeine.  - Eat more soluble fiber which forms a gel when mixed with water and slows movement.  - Good sources of soluble fiber include: peeled sweet potatoes, applesauce, refried beans, wheat and oat bran.   Try adding these foods that naturally thicken stool to your meals:  - Applesauce  - Cream of rice  - Peanut butter (creamy)  - Bananas   - Marshmallows - Rice  - Cheese  - Mashed potatoes  - Soda crackers  - Tapioca     Diarrhea: Start taking imodium twice daily and as needed for now.  You want to have stools but not too many stools. Some of the diet change above could help with the diarrhea and avoid having to use the imodium as much.   Contact Information: If you have questions or concerns, please call our office, [phone], Monday- Thursday 8AM-5PM and Friday 8AM-12Noon.  If it is after hours or on the weekend, please call [facility] Main Number, [phone], [phone], and ask to speak to the surgeon on call for Dr. Constance Haw at [facility].     Signed: Virl Cagey 04/21/2022, 12:37 PM

## 2022-04-21 NOTE — Progress Notes (Signed)
Patient discharged home with personal belongings, transported via w/c to personal vehicle that friend is driving, IV removed from RFA. Patient verbalized understanding of follow up appointments and medication .

## 2022-04-21 NOTE — Care Management Important Message (Signed)
Important Message  Patient Details  Name: Alexis Harris MRN: [id number] Date of Birth: 1971/02/02   Medicare Important Message Given:  Yes     Tommy Medal 04/21/2022, 12:46 PM

## 2022-04-21 NOTE — Discharge Instructions (Addendum)
IMPORTANT PLEASE READ ALL BELOW: Prolonged QTC: On your EKG (heart rhythm) you have what is called a prolonged QTC.  This puts you are at risk for arrhythmias (irregular heart rhythms) with certain medications.  We cannot prescribe the zofran for nausea as an outpatient and had to change this to compazine.  This QTC prolongation (being longer than normal) is likely from your issues with the electrolytes (salts in the blood) and the diarrhea and may get better/ resolve on its own.  For now we need to hold on any Zofran or medication that puts you at risk of this QTC getting even longer.  I have attached information about Long QT in your discharge instructions.   Wound:  Steristrips will peel off in the next 5-7 days. You can remove them once they are peeling off. It is ok to shower. Pat the area dry.  No heavy lifting > 10 lbs, excessive bending, pushing, pulling, or squatting for 6-8 weeks after surgery.  You may shower. But do not submerge in water, tub, etc.   Diet:  As tolerated.  Start to add back food that is more solid and high in fiber to help keep you from having diarrhea. Try small meals more frequently. Drink plenty of water and stay hydrated.   Diet Changes that can help thicken your stool: - Chew food well.  - Eat low sugar foods and drinks.  - Eat salty foods and add salt to meals and snacks.  - Eat smaller more frequent meals and snacks.  - Drink fluids  hour before or after meals, not with food.  - Avoid alcohol and caffeine.  - Eat more soluble fiber which forms a gel when mixed with water and slows movement.  - Good sources of soluble fiber include: peeled sweet potatoes, applesauce, refried beans, wheat and oat bran.   Try adding these foods that naturally thicken stool to your meals:  - Applesauce  - Cream of rice  - Peanut butter (creamy)  - Bananas  - Marshmallows - Rice  - Cheese  - Mashed potatoes  - Soda crackers  - Tapioca     Diarrhea: Start  taking imodium twice daily and as needed for now.  You want to have stools but not too many stools. Some of the diet change above could help with the diarrhea and avoid having to use the imodium as much.   Contact Information: If you have questions or concerns, please call our office, [phone], Monday- Thursday 8AM-5PM and Friday 8AM-12Noon.  If it is after hours or on the weekend, please call [facility] Main Number, [phone], [phone], and ask to speak to the surgeon on call for Dr. Constance Haw at [facility].

## 2022-04-21 NOTE — Plan of Care (Signed)
  Problem: Education: Goal: Understanding of discharge needs will improve Outcome: Adequate for Discharge Goal: Verbalization of understanding of the causes of altered bowel function will improve Outcome: Adequate for Discharge   Problem: Activity: Goal: Ability to tolerate increased activity will improve Outcome: Adequate for Discharge   Problem: Bowel/Gastric: Goal: Gastrointestinal status for postoperative course will improve Outcome: Adequate for Discharge   Problem: Health Behavior/Discharge Planning: Goal: Identification of community resources to assist with postoperative recovery needs will improve Outcome: Adequate for Discharge   Problem: Nutritional: Goal: Will attain and maintain optimal nutritional status will improve Outcome: Adequate for Discharge   Problem: Clinical Measurements: Goal: Postoperative complications will be avoided or minimized Outcome: Adequate for Discharge   Problem: Respiratory: Goal: Respiratory status will improve Outcome: Adequate for Discharge   Problem: Skin Integrity: Goal: Will show signs of wound healing Outcome: Adequate for Discharge   Problem: Education: Goal: Ability to describe self-care measures that may prevent or decrease complications (Diabetes Survival Skills Education) will improve Outcome: Adequate for Discharge Goal: Individualized Educational Video(s) Outcome: Adequate for Discharge   Problem: Coping: Goal: Ability to adjust to condition or change in health will improve Outcome: Adequate for Discharge   Problem: Fluid Volume: Goal: Ability to maintain a balanced intake and output will improve Outcome: Adequate for Discharge   Problem: Health Behavior/Discharge Planning: Goal: Ability to identify and utilize available resources and services will improve Outcome: Adequate for Discharge Goal: Ability to manage health-related needs will improve Outcome: Adequate for Discharge   Problem: Metabolic: Goal: Ability  to maintain appropriate glucose levels will improve Outcome: Adequate for Discharge   Problem: Nutritional: Goal: Maintenance of adequate nutrition will improve Outcome: Adequate for Discharge Goal: Progress toward achieving an optimal weight will improve Outcome: Adequate for Discharge   Problem: Skin Integrity: Goal: Risk for impaired skin integrity will decrease Outcome: Adequate for Discharge   Problem: Tissue Perfusion: Goal: Adequacy of tissue perfusion will improve Outcome: Adequate for Discharge   Problem: Education: Goal: Knowledge of General Education information will improve Description: Including pain rating scale, medication(s)/side effects and non-pharmacologic comfort measures Outcome: Adequate for Discharge   Problem: Health Behavior/Discharge Planning: Goal: Ability to manage health-related needs will improve Outcome: Adequate for Discharge   Problem: Clinical Measurements: Goal: Ability to maintain clinical measurements within normal limits will improve Outcome: Adequate for Discharge Goal: Will remain free from infection Outcome: Adequate for Discharge Goal: Diagnostic test results will improve Outcome: Adequate for Discharge Goal: Respiratory complications will improve Outcome: Adequate for Discharge Goal: Cardiovascular complication will be avoided Outcome: Adequate for Discharge   Problem: Activity: Goal: Risk for activity intolerance will decrease Outcome: Adequate for Discharge   Problem: Nutrition: Goal: Adequate nutrition will be maintained Outcome: Adequate for Discharge   Problem: Coping: Goal: Level of anxiety will decrease Outcome: Adequate for Discharge   Problem: Elimination: Goal: Will not experience complications related to bowel motility Outcome: Adequate for Discharge Goal: Will not experience complications related to urinary retention Outcome: Adequate for Discharge   Problem: Pain Managment: Goal: General experience of  comfort will improve Outcome: Adequate for Discharge   Problem: Safety: Goal: Ability to remain free from injury will improve Outcome: Adequate for Discharge   Problem: Skin Integrity: Goal: Risk for impaired skin integrity will decrease Outcome: Adequate for Discharge

## 2022-04-21 NOTE — TOC Transition Note (Signed)
Transition of Care [facility]) - CM/SW Discharge Note   Patient Details  Name: Alexis Harris MRN: [id number] Date of Birth: [birth year]  Transition of Care [facility] CM/SW Contact:  Iona Beard, [place] Phone Number: 04/21/2022, 12:36 PM   Clinical Narrative:    CSW updated that pt will D/C home today. Pt has been set up with [facility]. CSW updated Marjory Lies with [facility] of plan for D/C. HH orders have been placed. CSW added [facility] agency information to pts AVS. TOC signing off.   Final next level of care: Home w Home Health Services Barriers to Discharge: Barriers Resolved   Patient Goals and CMS Choice CMS Medicare.gov Compare Post Acute Care list provided to:: Patient Choice offered to / list presented to : Patient  Discharge Placement                         Discharge Plan and Services Additional resources added to the After Visit Summary for                            [facility] Arranged: PT [facility] Agency: [place] Date [place]: 04/19/22   Representative spoke with at [place]: [place] Determinants of Health ([place]) Interventions SDOH Screenings   Food Insecurity: No Food Insecurity (04/16/2022)  Housing: Low Risk  (04/16/2022)  Transportation Needs: No Transportation Needs (04/16/2022)  Utilities: Not At Risk (04/16/2022)  Alcohol Screen: Low Risk  (08/25/2020)  Depression (PHQ2-9): Medium Risk (08/25/2020)  Financial Resource Strain: Medium Risk (08/25/2020)  Physical Activity: Inactive (08/25/2020)  Social Connections: Socially Isolated (08/25/2020)  Stress: Stress Concern Present (08/25/2020)  Tobacco Use: High Risk (04/16/2022)     Readmission Risk Interventions     No data to display

## 2022-04-24 ENCOUNTER — Encounter: Payer: Self-pay | Admitting: Family Medicine

## 2022-04-25 DIAGNOSIS — G2581 Restless legs syndrome: Secondary | ICD-10-CM | POA: Diagnosis not present

## 2022-04-25 DIAGNOSIS — K5900 Constipation, unspecified: Secondary | ICD-10-CM | POA: Diagnosis not present

## 2022-04-25 DIAGNOSIS — M5137 Other intervertebral disc degeneration, lumbosacral region: Secondary | ICD-10-CM | POA: Diagnosis not present

## 2022-04-25 DIAGNOSIS — E871 Hypo-osmolality and hyponatremia: Secondary | ICD-10-CM | POA: Diagnosis not present

## 2022-04-25 DIAGNOSIS — K219 Gastro-esophageal reflux disease without esophagitis: Secondary | ICD-10-CM | POA: Diagnosis not present

## 2022-04-25 DIAGNOSIS — M1990 Unspecified osteoarthritis, unspecified site: Secondary | ICD-10-CM | POA: Diagnosis not present

## 2022-04-25 DIAGNOSIS — E1165 Type 2 diabetes mellitus with hyperglycemia: Secondary | ICD-10-CM | POA: Diagnosis not present

## 2022-04-25 DIAGNOSIS — E876 Hypokalemia: Secondary | ICD-10-CM | POA: Diagnosis not present

## 2022-04-25 DIAGNOSIS — G5600 Carpal tunnel syndrome, unspecified upper limb: Secondary | ICD-10-CM | POA: Diagnosis not present

## 2022-04-25 DIAGNOSIS — M797 Fibromyalgia: Secondary | ICD-10-CM | POA: Diagnosis not present

## 2022-04-25 DIAGNOSIS — K580 Irritable bowel syndrome with diarrhea: Secondary | ICD-10-CM | POA: Diagnosis not present

## 2022-04-25 DIAGNOSIS — F32A Depression, unspecified: Secondary | ICD-10-CM | POA: Diagnosis not present

## 2022-04-25 DIAGNOSIS — C189 Malignant neoplasm of colon, unspecified: Secondary | ICD-10-CM | POA: Diagnosis not present

## 2022-04-25 DIAGNOSIS — G8929 Other chronic pain: Secondary | ICD-10-CM | POA: Diagnosis not present

## 2022-04-25 DIAGNOSIS — J449 Chronic obstructive pulmonary disease, unspecified: Secondary | ICD-10-CM | POA: Diagnosis not present

## 2022-04-25 DIAGNOSIS — G4710 Hypersomnia, unspecified: Secondary | ICD-10-CM | POA: Diagnosis not present

## 2022-04-25 DIAGNOSIS — Z483 Aftercare following surgery for neoplasm: Secondary | ICD-10-CM | POA: Diagnosis not present

## 2022-04-25 DIAGNOSIS — M5030 Other cervical disc degeneration, unspecified cervical region: Secondary | ICD-10-CM | POA: Diagnosis not present

## 2022-04-25 DIAGNOSIS — G43909 Migraine, unspecified, not intractable, without status migrainosus: Secondary | ICD-10-CM | POA: Diagnosis not present

## 2022-04-25 DIAGNOSIS — G4733 Obstructive sleep apnea (adult) (pediatric): Secondary | ICD-10-CM | POA: Diagnosis not present

## 2022-04-25 DIAGNOSIS — E1140 Type 2 diabetes mellitus with diabetic neuropathy, unspecified: Secondary | ICD-10-CM | POA: Diagnosis not present

## 2022-04-26 ENCOUNTER — Ambulatory Visit (INDEPENDENT_AMBULATORY_CARE_PROVIDER_SITE_OTHER): Payer: 59 | Admitting: General Surgery

## 2022-04-26 ENCOUNTER — Encounter: Payer: Self-pay | Admitting: General Surgery

## 2022-04-26 ENCOUNTER — Other Ambulatory Visit: Payer: Self-pay

## 2022-04-26 VITALS — BP 116/81 | HR 104 | Temp 97.8°F | Resp 22 | Ht 64.0 in | Wt 175.0 lb

## 2022-04-26 DIAGNOSIS — D369 Benign neoplasm, unspecified site: Secondary | ICD-10-CM

## 2022-04-26 MED ORDER — DIPHENOXYLATE-ATROPINE 2.5-0.025 MG PO TABS: 1.0000 | ORAL_TABLET | Freq: Four times a day (QID) | ORAL | 0 refills | Status: DC | PRN

## 2022-04-26 NOTE — Progress Notes (Signed)
[facility]  [name] still very nauseated at times. She feels like she has no appetite. She is still having diarrhea.   BP 116/81   Pulse (!) 104   Temp 97.8 F (36.6 C) (Oral)   Resp (!) 22   Ht '5\' 4"'$  (1.626 m)   Wt 175 lb (79.4 kg)   SpO2 94%   BMI 30.04 kg/m  Midline with steristrips, no erythema or drainage  Patient s/p total colectomy and readmission for adynamic ileus for imodium.  Lomotil QID tapering up  Try to lomotil four times a day and see how that controls your diarrhea. You do not have to take it four times but can gradually increase up to four times a day. Start with twice a day and then go up from there.  Still take your imodium as needed but add the lomotil and see if this controls things better. Stay hydrated. Make sure you are eating small, frequent meals.   Future Appointments  Date Time Provider [place]  05/02/2022 11:45 AM Virl Cagey, MD RS-RS None  09/07/2022  3:00 PM Erenest Rasher, PA-C R[facility] [place], MD [facility] [address] [place], [place] [address] [phone] (office)

## 2022-04-26 NOTE — Patient Instructions (Signed)
Try to lomotil four times a day and see how that controls your diarrhea. You do not have to take it four times but can gradually increase up to four times a day. Start with twice a day and then go up from there.  Still take your imodium as needed but add the lomotil and see if this controls things better. Stay hydrated. Make sure you are eating small, frequent meals.

## 2022-04-27 DIAGNOSIS — K560 Paralytic ileus: Secondary | ICD-10-CM | POA: Diagnosis not present

## 2022-04-27 DIAGNOSIS — R197 Diarrhea, unspecified: Secondary | ICD-10-CM | POA: Diagnosis not present

## 2022-04-27 DIAGNOSIS — K219 Gastro-esophageal reflux disease without esophagitis: Secondary | ICD-10-CM | POA: Diagnosis not present

## 2022-04-27 DIAGNOSIS — J449 Chronic obstructive pulmonary disease, unspecified: Secondary | ICD-10-CM | POA: Diagnosis not present

## 2022-04-27 DIAGNOSIS — E1165 Type 2 diabetes mellitus with hyperglycemia: Secondary | ICD-10-CM | POA: Diagnosis not present

## 2022-05-01 DIAGNOSIS — G5600 Carpal tunnel syndrome, unspecified upper limb: Secondary | ICD-10-CM | POA: Diagnosis not present

## 2022-05-01 DIAGNOSIS — C189 Malignant neoplasm of colon, unspecified: Secondary | ICD-10-CM | POA: Diagnosis not present

## 2022-05-01 DIAGNOSIS — F32A Depression, unspecified: Secondary | ICD-10-CM | POA: Diagnosis not present

## 2022-05-01 DIAGNOSIS — K580 Irritable bowel syndrome with diarrhea: Secondary | ICD-10-CM | POA: Diagnosis not present

## 2022-05-01 DIAGNOSIS — M5030 Other cervical disc degeneration, unspecified cervical region: Secondary | ICD-10-CM | POA: Diagnosis not present

## 2022-05-01 DIAGNOSIS — G4733 Obstructive sleep apnea (adult) (pediatric): Secondary | ICD-10-CM | POA: Diagnosis not present

## 2022-05-01 DIAGNOSIS — G4710 Hypersomnia, unspecified: Secondary | ICD-10-CM | POA: Diagnosis not present

## 2022-05-01 DIAGNOSIS — J449 Chronic obstructive pulmonary disease, unspecified: Secondary | ICD-10-CM | POA: Diagnosis not present

## 2022-05-01 DIAGNOSIS — G43909 Migraine, unspecified, not intractable, without status migrainosus: Secondary | ICD-10-CM | POA: Diagnosis not present

## 2022-05-01 DIAGNOSIS — E876 Hypokalemia: Secondary | ICD-10-CM | POA: Diagnosis not present

## 2022-05-01 DIAGNOSIS — E871 Hypo-osmolality and hyponatremia: Secondary | ICD-10-CM | POA: Diagnosis not present

## 2022-05-01 DIAGNOSIS — G8929 Other chronic pain: Secondary | ICD-10-CM | POA: Diagnosis not present

## 2022-05-01 DIAGNOSIS — M797 Fibromyalgia: Secondary | ICD-10-CM | POA: Diagnosis not present

## 2022-05-01 DIAGNOSIS — K219 Gastro-esophageal reflux disease without esophagitis: Secondary | ICD-10-CM | POA: Diagnosis not present

## 2022-05-01 DIAGNOSIS — E1140 Type 2 diabetes mellitus with diabetic neuropathy, unspecified: Secondary | ICD-10-CM | POA: Diagnosis not present

## 2022-05-01 DIAGNOSIS — G2581 Restless legs syndrome: Secondary | ICD-10-CM | POA: Diagnosis not present

## 2022-05-01 DIAGNOSIS — Z483 Aftercare following surgery for neoplasm: Secondary | ICD-10-CM | POA: Diagnosis not present

## 2022-05-01 DIAGNOSIS — M1990 Unspecified osteoarthritis, unspecified site: Secondary | ICD-10-CM | POA: Diagnosis not present

## 2022-05-01 DIAGNOSIS — M5137 Other intervertebral disc degeneration, lumbosacral region: Secondary | ICD-10-CM | POA: Diagnosis not present

## 2022-05-01 DIAGNOSIS — E1165 Type 2 diabetes mellitus with hyperglycemia: Secondary | ICD-10-CM | POA: Diagnosis not present

## 2022-05-01 DIAGNOSIS — K5900 Constipation, unspecified: Secondary | ICD-10-CM | POA: Diagnosis not present

## 2022-05-02 ENCOUNTER — Encounter: Payer: Self-pay | Admitting: General Surgery

## 2022-05-02 ENCOUNTER — Ambulatory Visit (INDEPENDENT_AMBULATORY_CARE_PROVIDER_SITE_OTHER): Payer: 59 | Admitting: General Surgery

## 2022-05-02 VITALS — BP 123/84 | HR 104 | Temp 97.6°F | Resp 16 | Ht 64.0 in | Wt 174.0 lb

## 2022-05-02 DIAGNOSIS — K560 Paralytic ileus: Secondary | ICD-10-CM

## 2022-05-02 DIAGNOSIS — D369 Benign neoplasm, unspecified site: Secondary | ICD-10-CM

## 2022-05-02 MED ORDER — DIPHENOXYLATE-ATROPINE 2.5-0.025 MG PO TABS: 1.0000 | ORAL_TABLET | Freq: Four times a day (QID) | ORAL | 3 refills | Status: DC | PRN

## 2022-05-02 NOTE — Progress Notes (Unsigned)
[facility] Future Appointments  Date Time Provider [place]  05/02/2022 11:45 AM Virl Cagey, MD RS-RS None  05/23/2022 11:15 AM Virl Cagey, MD RS-RS None  09/07/2022  3:00 PM Erenest Rasher, PA-C [facility]

## 2022-05-02 NOTE — Patient Instructions (Signed)
Continue with the lomotil up to four times a day and imodium as needed. Continue to stay hydrated. Can start back on your chronic pain medication/ patch as needed.

## 2022-05-05 DIAGNOSIS — M5030 Other cervical disc degeneration, unspecified cervical region: Secondary | ICD-10-CM | POA: Diagnosis not present

## 2022-05-05 DIAGNOSIS — M797 Fibromyalgia: Secondary | ICD-10-CM | POA: Diagnosis not present

## 2022-05-05 DIAGNOSIS — K219 Gastro-esophageal reflux disease without esophagitis: Secondary | ICD-10-CM | POA: Diagnosis not present

## 2022-05-05 DIAGNOSIS — Z483 Aftercare following surgery for neoplasm: Secondary | ICD-10-CM | POA: Diagnosis not present

## 2022-05-05 DIAGNOSIS — K580 Irritable bowel syndrome with diarrhea: Secondary | ICD-10-CM | POA: Diagnosis not present

## 2022-05-05 DIAGNOSIS — G2581 Restless legs syndrome: Secondary | ICD-10-CM | POA: Diagnosis not present

## 2022-05-05 DIAGNOSIS — G43909 Migraine, unspecified, not intractable, without status migrainosus: Secondary | ICD-10-CM | POA: Diagnosis not present

## 2022-05-05 DIAGNOSIS — E876 Hypokalemia: Secondary | ICD-10-CM | POA: Diagnosis not present

## 2022-05-05 DIAGNOSIS — E871 Hypo-osmolality and hyponatremia: Secondary | ICD-10-CM | POA: Diagnosis not present

## 2022-05-05 DIAGNOSIS — M5137 Other intervertebral disc degeneration, lumbosacral region: Secondary | ICD-10-CM | POA: Diagnosis not present

## 2022-05-05 DIAGNOSIS — G4733 Obstructive sleep apnea (adult) (pediatric): Secondary | ICD-10-CM | POA: Diagnosis not present

## 2022-05-05 DIAGNOSIS — F32A Depression, unspecified: Secondary | ICD-10-CM | POA: Diagnosis not present

## 2022-05-05 DIAGNOSIS — G5600 Carpal tunnel syndrome, unspecified upper limb: Secondary | ICD-10-CM | POA: Diagnosis not present

## 2022-05-05 DIAGNOSIS — G4710 Hypersomnia, unspecified: Secondary | ICD-10-CM | POA: Diagnosis not present

## 2022-05-05 DIAGNOSIS — J449 Chronic obstructive pulmonary disease, unspecified: Secondary | ICD-10-CM | POA: Diagnosis not present

## 2022-05-05 DIAGNOSIS — K5900 Constipation, unspecified: Secondary | ICD-10-CM | POA: Diagnosis not present

## 2022-05-05 DIAGNOSIS — C189 Malignant neoplasm of colon, unspecified: Secondary | ICD-10-CM | POA: Diagnosis not present

## 2022-05-05 DIAGNOSIS — G8929 Other chronic pain: Secondary | ICD-10-CM | POA: Diagnosis not present

## 2022-05-05 DIAGNOSIS — M1990 Unspecified osteoarthritis, unspecified site: Secondary | ICD-10-CM | POA: Diagnosis not present

## 2022-05-05 DIAGNOSIS — E1165 Type 2 diabetes mellitus with hyperglycemia: Secondary | ICD-10-CM | POA: Diagnosis not present

## 2022-05-05 DIAGNOSIS — E1140 Type 2 diabetes mellitus with diabetic neuropathy, unspecified: Secondary | ICD-10-CM | POA: Diagnosis not present

## 2022-05-10 DIAGNOSIS — M5137 Other intervertebral disc degeneration, lumbosacral region: Secondary | ICD-10-CM | POA: Diagnosis not present

## 2022-05-10 DIAGNOSIS — E876 Hypokalemia: Secondary | ICD-10-CM | POA: Diagnosis not present

## 2022-05-10 DIAGNOSIS — K219 Gastro-esophageal reflux disease without esophagitis: Secondary | ICD-10-CM | POA: Diagnosis not present

## 2022-05-10 DIAGNOSIS — Z483 Aftercare following surgery for neoplasm: Secondary | ICD-10-CM | POA: Diagnosis not present

## 2022-05-10 DIAGNOSIS — F32A Depression, unspecified: Secondary | ICD-10-CM | POA: Diagnosis not present

## 2022-05-10 DIAGNOSIS — G2581 Restless legs syndrome: Secondary | ICD-10-CM | POA: Diagnosis not present

## 2022-05-10 DIAGNOSIS — G43909 Migraine, unspecified, not intractable, without status migrainosus: Secondary | ICD-10-CM | POA: Diagnosis not present

## 2022-05-10 DIAGNOSIS — C189 Malignant neoplasm of colon, unspecified: Secondary | ICD-10-CM | POA: Diagnosis not present

## 2022-05-10 DIAGNOSIS — M797 Fibromyalgia: Secondary | ICD-10-CM | POA: Diagnosis not present

## 2022-05-10 DIAGNOSIS — E871 Hypo-osmolality and hyponatremia: Secondary | ICD-10-CM | POA: Diagnosis not present

## 2022-05-10 DIAGNOSIS — G4710 Hypersomnia, unspecified: Secondary | ICD-10-CM | POA: Diagnosis not present

## 2022-05-10 DIAGNOSIS — M1990 Unspecified osteoarthritis, unspecified site: Secondary | ICD-10-CM | POA: Diagnosis not present

## 2022-05-10 DIAGNOSIS — K5900 Constipation, unspecified: Secondary | ICD-10-CM | POA: Diagnosis not present

## 2022-05-10 DIAGNOSIS — G5600 Carpal tunnel syndrome, unspecified upper limb: Secondary | ICD-10-CM | POA: Diagnosis not present

## 2022-05-10 DIAGNOSIS — M5030 Other cervical disc degeneration, unspecified cervical region: Secondary | ICD-10-CM | POA: Diagnosis not present

## 2022-05-10 DIAGNOSIS — G8929 Other chronic pain: Secondary | ICD-10-CM | POA: Diagnosis not present

## 2022-05-10 DIAGNOSIS — E1165 Type 2 diabetes mellitus with hyperglycemia: Secondary | ICD-10-CM | POA: Diagnosis not present

## 2022-05-10 DIAGNOSIS — K580 Irritable bowel syndrome with diarrhea: Secondary | ICD-10-CM | POA: Diagnosis not present

## 2022-05-10 DIAGNOSIS — J449 Chronic obstructive pulmonary disease, unspecified: Secondary | ICD-10-CM | POA: Diagnosis not present

## 2022-05-10 DIAGNOSIS — E1140 Type 2 diabetes mellitus with diabetic neuropathy, unspecified: Secondary | ICD-10-CM | POA: Diagnosis not present

## 2022-05-10 DIAGNOSIS — G4733 Obstructive sleep apnea (adult) (pediatric): Secondary | ICD-10-CM | POA: Diagnosis not present

## 2022-05-18 NOTE — Progress Notes (Signed)
Attempted to call patient to schedule follow-up LDCT. Unable to reach the patient at this time, detailed VM left asking that the patient return my call.

## 2022-05-19 DIAGNOSIS — G4733 Obstructive sleep apnea (adult) (pediatric): Secondary | ICD-10-CM | POA: Diagnosis not present

## 2022-05-19 DIAGNOSIS — G43909 Migraine, unspecified, not intractable, without status migrainosus: Secondary | ICD-10-CM | POA: Diagnosis not present

## 2022-05-19 DIAGNOSIS — M5137 Other intervertebral disc degeneration, lumbosacral region: Secondary | ICD-10-CM | POA: Diagnosis not present

## 2022-05-19 DIAGNOSIS — E871 Hypo-osmolality and hyponatremia: Secondary | ICD-10-CM | POA: Diagnosis not present

## 2022-05-19 DIAGNOSIS — G8929 Other chronic pain: Secondary | ICD-10-CM | POA: Diagnosis not present

## 2022-05-19 DIAGNOSIS — E1140 Type 2 diabetes mellitus with diabetic neuropathy, unspecified: Secondary | ICD-10-CM | POA: Diagnosis not present

## 2022-05-19 DIAGNOSIS — G5600 Carpal tunnel syndrome, unspecified upper limb: Secondary | ICD-10-CM | POA: Diagnosis not present

## 2022-05-19 DIAGNOSIS — K580 Irritable bowel syndrome with diarrhea: Secondary | ICD-10-CM | POA: Diagnosis not present

## 2022-05-19 DIAGNOSIS — E876 Hypokalemia: Secondary | ICD-10-CM | POA: Diagnosis not present

## 2022-05-19 DIAGNOSIS — C189 Malignant neoplasm of colon, unspecified: Secondary | ICD-10-CM | POA: Diagnosis not present

## 2022-05-19 DIAGNOSIS — M1990 Unspecified osteoarthritis, unspecified site: Secondary | ICD-10-CM | POA: Diagnosis not present

## 2022-05-19 DIAGNOSIS — Z483 Aftercare following surgery for neoplasm: Secondary | ICD-10-CM | POA: Diagnosis not present

## 2022-05-19 DIAGNOSIS — F32A Depression, unspecified: Secondary | ICD-10-CM | POA: Diagnosis not present

## 2022-05-19 DIAGNOSIS — G2581 Restless legs syndrome: Secondary | ICD-10-CM | POA: Diagnosis not present

## 2022-05-19 DIAGNOSIS — K219 Gastro-esophageal reflux disease without esophagitis: Secondary | ICD-10-CM | POA: Diagnosis not present

## 2022-05-19 DIAGNOSIS — M797 Fibromyalgia: Secondary | ICD-10-CM | POA: Diagnosis not present

## 2022-05-19 DIAGNOSIS — G4710 Hypersomnia, unspecified: Secondary | ICD-10-CM | POA: Diagnosis not present

## 2022-05-19 DIAGNOSIS — J449 Chronic obstructive pulmonary disease, unspecified: Secondary | ICD-10-CM | POA: Diagnosis not present

## 2022-05-19 DIAGNOSIS — E1165 Type 2 diabetes mellitus with hyperglycemia: Secondary | ICD-10-CM | POA: Diagnosis not present

## 2022-05-19 DIAGNOSIS — M5030 Other cervical disc degeneration, unspecified cervical region: Secondary | ICD-10-CM | POA: Diagnosis not present

## 2022-05-19 DIAGNOSIS — K5900 Constipation, unspecified: Secondary | ICD-10-CM | POA: Diagnosis not present

## 2022-05-22 DIAGNOSIS — M797 Fibromyalgia: Secondary | ICD-10-CM | POA: Diagnosis not present

## 2022-05-22 DIAGNOSIS — K5900 Constipation, unspecified: Secondary | ICD-10-CM | POA: Diagnosis not present

## 2022-05-22 DIAGNOSIS — F32A Depression, unspecified: Secondary | ICD-10-CM | POA: Diagnosis not present

## 2022-05-22 DIAGNOSIS — G4733 Obstructive sleep apnea (adult) (pediatric): Secondary | ICD-10-CM | POA: Diagnosis not present

## 2022-05-22 DIAGNOSIS — G4710 Hypersomnia, unspecified: Secondary | ICD-10-CM | POA: Diagnosis not present

## 2022-05-22 DIAGNOSIS — E1140 Type 2 diabetes mellitus with diabetic neuropathy, unspecified: Secondary | ICD-10-CM | POA: Diagnosis not present

## 2022-05-22 DIAGNOSIS — C189 Malignant neoplasm of colon, unspecified: Secondary | ICD-10-CM | POA: Diagnosis not present

## 2022-05-22 DIAGNOSIS — G8929 Other chronic pain: Secondary | ICD-10-CM | POA: Diagnosis not present

## 2022-05-22 DIAGNOSIS — G5600 Carpal tunnel syndrome, unspecified upper limb: Secondary | ICD-10-CM | POA: Diagnosis not present

## 2022-05-22 DIAGNOSIS — G2581 Restless legs syndrome: Secondary | ICD-10-CM | POA: Diagnosis not present

## 2022-05-22 DIAGNOSIS — K219 Gastro-esophageal reflux disease without esophagitis: Secondary | ICD-10-CM | POA: Diagnosis not present

## 2022-05-22 DIAGNOSIS — G43909 Migraine, unspecified, not intractable, without status migrainosus: Secondary | ICD-10-CM | POA: Diagnosis not present

## 2022-05-22 DIAGNOSIS — E1165 Type 2 diabetes mellitus with hyperglycemia: Secondary | ICD-10-CM | POA: Diagnosis not present

## 2022-05-22 DIAGNOSIS — J449 Chronic obstructive pulmonary disease, unspecified: Secondary | ICD-10-CM | POA: Diagnosis not present

## 2022-05-22 DIAGNOSIS — M1990 Unspecified osteoarthritis, unspecified site: Secondary | ICD-10-CM | POA: Diagnosis not present

## 2022-05-22 DIAGNOSIS — M5030 Other cervical disc degeneration, unspecified cervical region: Secondary | ICD-10-CM | POA: Diagnosis not present

## 2022-05-22 DIAGNOSIS — E876 Hypokalemia: Secondary | ICD-10-CM | POA: Diagnosis not present

## 2022-05-22 DIAGNOSIS — E871 Hypo-osmolality and hyponatremia: Secondary | ICD-10-CM | POA: Diagnosis not present

## 2022-05-22 DIAGNOSIS — M5137 Other intervertebral disc degeneration, lumbosacral region: Secondary | ICD-10-CM | POA: Diagnosis not present

## 2022-05-22 DIAGNOSIS — K580 Irritable bowel syndrome with diarrhea: Secondary | ICD-10-CM | POA: Diagnosis not present

## 2022-05-22 DIAGNOSIS — Z483 Aftercare following surgery for neoplasm: Secondary | ICD-10-CM | POA: Diagnosis not present

## 2022-05-23 ENCOUNTER — Encounter: Payer: 59 | Admitting: General Surgery

## 2022-05-23 DIAGNOSIS — E1140 Type 2 diabetes mellitus with diabetic neuropathy, unspecified: Secondary | ICD-10-CM | POA: Diagnosis not present

## 2022-05-23 DIAGNOSIS — R519 Headache, unspecified: Secondary | ICD-10-CM | POA: Diagnosis not present

## 2022-05-23 DIAGNOSIS — G894 Chronic pain syndrome: Secondary | ICD-10-CM | POA: Diagnosis not present

## 2022-05-23 DIAGNOSIS — M5137 Other intervertebral disc degeneration, lumbosacral region: Secondary | ICD-10-CM | POA: Diagnosis not present

## 2022-05-23 DIAGNOSIS — Z79891 Long term (current) use of opiate analgesic: Secondary | ICD-10-CM | POA: Diagnosis not present

## 2022-05-23 DIAGNOSIS — M48061 Spinal stenosis, lumbar region without neurogenic claudication: Secondary | ICD-10-CM | POA: Diagnosis not present

## 2022-05-23 DIAGNOSIS — Z79899 Other long term (current) drug therapy: Secondary | ICD-10-CM | POA: Diagnosis not present

## 2022-05-24 ENCOUNTER — Other Ambulatory Visit: Payer: Self-pay

## 2022-05-24 DIAGNOSIS — Z87891 Personal history of nicotine dependence: Secondary | ICD-10-CM

## 2022-05-24 DIAGNOSIS — Z122 Encounter for screening for malignant neoplasm of respiratory organs: Secondary | ICD-10-CM

## 2022-05-24 NOTE — Progress Notes (Signed)
LDCT order placed per protocol. Scan scheduled for 04/15 at 1p.

## 2022-05-25 ENCOUNTER — Ambulatory Visit (INDEPENDENT_AMBULATORY_CARE_PROVIDER_SITE_OTHER): Payer: 59 | Admitting: General Surgery

## 2022-05-25 ENCOUNTER — Encounter: Payer: Self-pay | Admitting: General Surgery

## 2022-05-25 VITALS — BP 112/76 | HR 91 | Temp 98.1°F | Resp 16 | Ht 64.0 in | Wt 165.0 lb

## 2022-05-25 DIAGNOSIS — D369 Benign neoplasm, unspecified site: Secondary | ICD-10-CM

## 2022-05-25 MED ORDER — DIPHENOXYLATE-ATROPINE 2.5-0.025 MG PO TABS: 1.0000 | ORAL_TABLET | Freq: Four times a day (QID) | ORAL | 3 refills | Status: DC | PRN

## 2022-05-25 NOTE — Patient Instructions (Signed)
Continue lomotil , metamucil, imodium as you have been doing. Will check on you in 3 months.  Will make sure we have a long term plan for the lomotil Rx by May.

## 2022-05-25 NOTE — Progress Notes (Signed)
[facility]  Doing well. Using metamucil, lomotil and imodium. She has 2-6 Bms a day. She is eating and drinking well.  BP 112/76   Pulse 91   Temp 98.1 F (36.7 C) (Oral)   Resp 16   Ht 5' 4"$  (1.626 m)   Wt 165 lb (74.8 kg)   SpO2 96%   BMI 28.32 kg/m   Incision healed, no signs of hernia  Patient s/p total colectomy doing well. Doing better with the diarrhea.  Continue lomotil , metamucil, imodium as you have been doing. Will check on you in 3 months.  Will make sure we have a long term plan for the lomotil Rx by May.  I sent Dr. Abbey Chatters and Venetia Night a message about the lomotil.   Future Appointments  Date Time Provider [place]  07/17/2022  1:00 PM AP-CT 1 AP-CT [place] [facility]  08/15/2022  9:15 AM Virl Cagey, MD RS-RS None  09/07/2022  3:00 PM Erenest Rasher, PA-C [facility] [place], MD [facility] [address] [place], [place] [address] [phone] (office)

## 2022-05-29 DIAGNOSIS — E1165 Type 2 diabetes mellitus with hyperglycemia: Secondary | ICD-10-CM | POA: Diagnosis not present

## 2022-05-29 DIAGNOSIS — J449 Chronic obstructive pulmonary disease, unspecified: Secondary | ICD-10-CM | POA: Diagnosis not present

## 2022-06-01 DIAGNOSIS — K219 Gastro-esophageal reflux disease without esophagitis: Secondary | ICD-10-CM | POA: Diagnosis not present

## 2022-06-01 DIAGNOSIS — M5030 Other cervical disc degeneration, unspecified cervical region: Secondary | ICD-10-CM | POA: Diagnosis not present

## 2022-06-01 DIAGNOSIS — G5600 Carpal tunnel syndrome, unspecified upper limb: Secondary | ICD-10-CM | POA: Diagnosis not present

## 2022-06-01 DIAGNOSIS — E1140 Type 2 diabetes mellitus with diabetic neuropathy, unspecified: Secondary | ICD-10-CM | POA: Diagnosis not present

## 2022-06-01 DIAGNOSIS — G43909 Migraine, unspecified, not intractable, without status migrainosus: Secondary | ICD-10-CM | POA: Diagnosis not present

## 2022-06-01 DIAGNOSIS — M797 Fibromyalgia: Secondary | ICD-10-CM | POA: Diagnosis not present

## 2022-06-01 DIAGNOSIS — G8929 Other chronic pain: Secondary | ICD-10-CM | POA: Diagnosis not present

## 2022-06-01 DIAGNOSIS — Z483 Aftercare following surgery for neoplasm: Secondary | ICD-10-CM | POA: Diagnosis not present

## 2022-06-01 DIAGNOSIS — M5137 Other intervertebral disc degeneration, lumbosacral region: Secondary | ICD-10-CM | POA: Diagnosis not present

## 2022-06-01 DIAGNOSIS — K5900 Constipation, unspecified: Secondary | ICD-10-CM | POA: Diagnosis not present

## 2022-06-01 DIAGNOSIS — E1165 Type 2 diabetes mellitus with hyperglycemia: Secondary | ICD-10-CM | POA: Diagnosis not present

## 2022-06-01 DIAGNOSIS — K580 Irritable bowel syndrome with diarrhea: Secondary | ICD-10-CM | POA: Diagnosis not present

## 2022-06-01 DIAGNOSIS — F32A Depression, unspecified: Secondary | ICD-10-CM | POA: Diagnosis not present

## 2022-06-01 DIAGNOSIS — G4710 Hypersomnia, unspecified: Secondary | ICD-10-CM | POA: Diagnosis not present

## 2022-06-01 DIAGNOSIS — E876 Hypokalemia: Secondary | ICD-10-CM | POA: Diagnosis not present

## 2022-06-01 DIAGNOSIS — M1990 Unspecified osteoarthritis, unspecified site: Secondary | ICD-10-CM | POA: Diagnosis not present

## 2022-06-01 DIAGNOSIS — C189 Malignant neoplasm of colon, unspecified: Secondary | ICD-10-CM | POA: Diagnosis not present

## 2022-06-01 DIAGNOSIS — J449 Chronic obstructive pulmonary disease, unspecified: Secondary | ICD-10-CM | POA: Diagnosis not present

## 2022-06-01 DIAGNOSIS — E871 Hypo-osmolality and hyponatremia: Secondary | ICD-10-CM | POA: Diagnosis not present

## 2022-06-01 DIAGNOSIS — G2581 Restless legs syndrome: Secondary | ICD-10-CM | POA: Diagnosis not present

## 2022-06-01 DIAGNOSIS — G4733 Obstructive sleep apnea (adult) (pediatric): Secondary | ICD-10-CM | POA: Diagnosis not present

## 2022-06-07 DIAGNOSIS — F17200 Nicotine dependence, unspecified, uncomplicated: Secondary | ICD-10-CM | POA: Diagnosis not present

## 2022-06-07 DIAGNOSIS — F17210 Nicotine dependence, cigarettes, uncomplicated: Secondary | ICD-10-CM | POA: Diagnosis not present

## 2022-06-07 DIAGNOSIS — Z0001 Encounter for general adult medical examination with abnormal findings: Secondary | ICD-10-CM | POA: Diagnosis not present

## 2022-06-07 DIAGNOSIS — E876 Hypokalemia: Secondary | ICD-10-CM | POA: Diagnosis not present

## 2022-06-07 DIAGNOSIS — I700 Atherosclerosis of aorta: Secondary | ICD-10-CM | POA: Diagnosis not present

## 2022-06-07 DIAGNOSIS — Z1389 Encounter for screening for other disorder: Secondary | ICD-10-CM | POA: Diagnosis not present

## 2022-06-07 DIAGNOSIS — E559 Vitamin D deficiency, unspecified: Secondary | ICD-10-CM | POA: Diagnosis not present

## 2022-06-07 DIAGNOSIS — L853 Xerosis cutis: Secondary | ICD-10-CM | POA: Diagnosis not present

## 2022-06-07 DIAGNOSIS — K219 Gastro-esophageal reflux disease without esophagitis: Secondary | ICD-10-CM | POA: Diagnosis not present

## 2022-06-07 DIAGNOSIS — E1165 Type 2 diabetes mellitus with hyperglycemia: Secondary | ICD-10-CM | POA: Diagnosis not present

## 2022-06-07 DIAGNOSIS — Z79899 Other long term (current) drug therapy: Secondary | ICD-10-CM | POA: Diagnosis not present

## 2022-06-14 DIAGNOSIS — J449 Chronic obstructive pulmonary disease, unspecified: Secondary | ICD-10-CM | POA: Diagnosis not present

## 2022-06-14 DIAGNOSIS — K580 Irritable bowel syndrome with diarrhea: Secondary | ICD-10-CM | POA: Diagnosis not present

## 2022-06-14 DIAGNOSIS — G8929 Other chronic pain: Secondary | ICD-10-CM | POA: Diagnosis not present

## 2022-06-14 DIAGNOSIS — G4710 Hypersomnia, unspecified: Secondary | ICD-10-CM | POA: Diagnosis not present

## 2022-06-14 DIAGNOSIS — M797 Fibromyalgia: Secondary | ICD-10-CM | POA: Diagnosis not present

## 2022-06-14 DIAGNOSIS — K219 Gastro-esophageal reflux disease without esophagitis: Secondary | ICD-10-CM | POA: Diagnosis not present

## 2022-06-14 DIAGNOSIS — E1140 Type 2 diabetes mellitus with diabetic neuropathy, unspecified: Secondary | ICD-10-CM | POA: Diagnosis not present

## 2022-06-14 DIAGNOSIS — E876 Hypokalemia: Secondary | ICD-10-CM | POA: Diagnosis not present

## 2022-06-14 DIAGNOSIS — G43909 Migraine, unspecified, not intractable, without status migrainosus: Secondary | ICD-10-CM | POA: Diagnosis not present

## 2022-06-14 DIAGNOSIS — E871 Hypo-osmolality and hyponatremia: Secondary | ICD-10-CM | POA: Diagnosis not present

## 2022-06-14 DIAGNOSIS — M5137 Other intervertebral disc degeneration, lumbosacral region: Secondary | ICD-10-CM | POA: Diagnosis not present

## 2022-06-14 DIAGNOSIS — E1165 Type 2 diabetes mellitus with hyperglycemia: Secondary | ICD-10-CM | POA: Diagnosis not present

## 2022-06-14 DIAGNOSIS — G5600 Carpal tunnel syndrome, unspecified upper limb: Secondary | ICD-10-CM | POA: Diagnosis not present

## 2022-06-14 DIAGNOSIS — Z483 Aftercare following surgery for neoplasm: Secondary | ICD-10-CM | POA: Diagnosis not present

## 2022-06-14 DIAGNOSIS — M1990 Unspecified osteoarthritis, unspecified site: Secondary | ICD-10-CM | POA: Diagnosis not present

## 2022-06-14 DIAGNOSIS — C189 Malignant neoplasm of colon, unspecified: Secondary | ICD-10-CM | POA: Diagnosis not present

## 2022-06-14 DIAGNOSIS — K5900 Constipation, unspecified: Secondary | ICD-10-CM | POA: Diagnosis not present

## 2022-06-14 DIAGNOSIS — G2581 Restless legs syndrome: Secondary | ICD-10-CM | POA: Diagnosis not present

## 2022-06-14 DIAGNOSIS — G4733 Obstructive sleep apnea (adult) (pediatric): Secondary | ICD-10-CM | POA: Diagnosis not present

## 2022-06-14 DIAGNOSIS — F32A Depression, unspecified: Secondary | ICD-10-CM | POA: Diagnosis not present

## 2022-06-14 DIAGNOSIS — M5030 Other cervical disc degeneration, unspecified cervical region: Secondary | ICD-10-CM | POA: Diagnosis not present

## 2022-06-21 ENCOUNTER — Other Ambulatory Visit: Payer: Self-pay | Admitting: General Surgery

## 2022-06-21 ENCOUNTER — Other Ambulatory Visit: Payer: Self-pay | Admitting: Gastroenterology

## 2022-06-21 DIAGNOSIS — K219 Gastro-esophageal reflux disease without esophagitis: Secondary | ICD-10-CM

## 2022-06-21 NOTE — Telephone Encounter (Signed)
Phoned and advised the pt of her Rx being sent to her pharmacy. Pt expressed understanding

## 2022-06-29 DIAGNOSIS — J441 Chronic obstructive pulmonary disease with (acute) exacerbation: Secondary | ICD-10-CM | POA: Diagnosis not present

## 2022-06-29 DIAGNOSIS — R600 Localized edema: Secondary | ICD-10-CM | POA: Diagnosis not present

## 2022-06-29 DIAGNOSIS — E1165 Type 2 diabetes mellitus with hyperglycemia: Secondary | ICD-10-CM | POA: Diagnosis not present

## 2022-06-29 DIAGNOSIS — F17200 Nicotine dependence, unspecified, uncomplicated: Secondary | ICD-10-CM | POA: Diagnosis not present

## 2022-07-06 ENCOUNTER — Other Ambulatory Visit: Payer: Self-pay | Admitting: General Surgery

## 2022-07-17 ENCOUNTER — Ambulatory Visit (HOSPITAL_COMMUNITY)
Admission: RE | Admit: 2022-07-17 | Discharge: 2022-07-17 | Disposition: A | Discharge status: Home / Self Care | Payer: 59 | Source: Ambulatory Visit | Attending: Physician Assistant | Admitting: Physician Assistant

## 2022-07-17 DIAGNOSIS — F17210 Nicotine dependence, cigarettes, uncomplicated: Secondary | ICD-10-CM | POA: Diagnosis not present

## 2022-07-17 DIAGNOSIS — Z87891 Personal history of nicotine dependence: Secondary | ICD-10-CM

## 2022-07-17 DIAGNOSIS — Z122 Encounter for screening for malignant neoplasm of respiratory organs: Secondary | ICD-10-CM

## 2022-07-19 DIAGNOSIS — J449 Chronic obstructive pulmonary disease, unspecified: Secondary | ICD-10-CM | POA: Diagnosis not present

## 2022-07-19 DIAGNOSIS — Z79899 Other long term (current) drug therapy: Secondary | ICD-10-CM | POA: Diagnosis not present

## 2022-07-19 DIAGNOSIS — J45909 Unspecified asthma, uncomplicated: Secondary | ICD-10-CM | POA: Diagnosis not present

## 2022-07-19 DIAGNOSIS — E119 Type 2 diabetes mellitus without complications: Secondary | ICD-10-CM | POA: Diagnosis not present

## 2022-07-19 DIAGNOSIS — K219 Gastro-esophageal reflux disease without esophagitis: Secondary | ICD-10-CM | POA: Diagnosis not present

## 2022-07-19 DIAGNOSIS — Z7984 Long term (current) use of oral hypoglycemic drugs: Secondary | ICD-10-CM | POA: Diagnosis not present

## 2022-07-20 ENCOUNTER — Other Ambulatory Visit: Payer: Self-pay | Admitting: Gastroenterology

## 2022-07-20 DIAGNOSIS — K219 Gastro-esophageal reflux disease without esophagitis: Secondary | ICD-10-CM

## 2022-07-21 NOTE — Progress Notes (Signed)
Patient notified of LDCT Lung Cancer Screening Results via mail with the recommendation to follow-up in 12 months. Patient's referring provider has been sent a copy of results. Results are as follows:  IMPRESSION: 1. Lung-RADS 2, benign appearance or behavior. Continue annual screening with low-dose chest CT without contrast in 12 months. 2. Aortic atherosclerosis. 3. Mild diffuse bronchial wall thickening with mild centrilobular and paraseptal emphysema; imaging findings suggestive of underlying COPD.

## 2022-07-30 DIAGNOSIS — E1165 Type 2 diabetes mellitus with hyperglycemia: Secondary | ICD-10-CM | POA: Diagnosis not present

## 2022-07-30 DIAGNOSIS — J449 Chronic obstructive pulmonary disease, unspecified: Secondary | ICD-10-CM | POA: Diagnosis not present

## 2022-08-10 ENCOUNTER — Other Ambulatory Visit: Payer: Self-pay | Admitting: Gastroenterology

## 2022-08-10 DIAGNOSIS — K5904 Chronic idiopathic constipation: Secondary | ICD-10-CM

## 2022-08-15 ENCOUNTER — Encounter: Payer: Self-pay | Admitting: General Surgery

## 2022-08-15 ENCOUNTER — Ambulatory Visit: Payer: 59 | Admitting: General Surgery

## 2022-08-15 VITALS — BP 116/74 | HR 83 | Temp 98.3°F | Resp 14 | Ht 64.0 in | Wt 193.0 lb

## 2022-08-15 DIAGNOSIS — Z9049 Acquired absence of other specified parts of digestive tract: Secondary | ICD-10-CM | POA: Insufficient documentation

## 2022-08-15 MED ORDER — DIPHENOXYLATE-ATROPINE 2.5-0.025 MG PO TABS: 1.0000 | ORAL_TABLET | Freq: Four times a day (QID) | ORAL | 0 refills | Status: AC | PRN

## 2022-08-15 NOTE — Progress Notes (Signed)
[facility]  Doing well. Eating and having Bms. Does have a few Bms a day but usually around 2 and can be more. Takes lomotil and imodium.  BP 116/74   Pulse 83   Temp 98.3 F (36.8 C) (Oral)   Resp 14   Ht 5\' 4"  (1.626 m)   Wt 193 lb (87.5 kg)   SpO2 96%   BMI 33.13 kg/m  Soft, nondistended, nontender  Patient s/p total colectomy. Doing well.  Continue lomotil, metamucil, imodium as needed. Takes about 2 doses of imodium a day she says. Lomotil refilled. I had  talked to Dr. Marletta Lor and Brooke Bonito who had said they would continue to prescribe the lomotil since they will follow her longterm.   Follow up as needed.   Algis Greenhouse, MD [facility] [address] [place], [place] [address] [phone] (office)

## 2022-08-15 NOTE — Patient Instructions (Signed)
Continue lomotil and imodium for diarrhea. GI will refill lomotil from here on out. Diet and activity as tolerated. Call with issues.

## 2022-08-29 DIAGNOSIS — J449 Chronic obstructive pulmonary disease, unspecified: Secondary | ICD-10-CM | POA: Diagnosis not present

## 2022-08-29 DIAGNOSIS — E1165 Type 2 diabetes mellitus with hyperglycemia: Secondary | ICD-10-CM | POA: Diagnosis not present

## 2022-09-05 DIAGNOSIS — F17210 Nicotine dependence, cigarettes, uncomplicated: Secondary | ICD-10-CM | POA: Diagnosis not present

## 2022-09-05 DIAGNOSIS — Z7901 Long term (current) use of anticoagulants: Secondary | ICD-10-CM | POA: Diagnosis not present

## 2022-09-05 DIAGNOSIS — I82412 Acute embolism and thrombosis of left femoral vein: Secondary | ICD-10-CM | POA: Diagnosis not present

## 2022-09-05 DIAGNOSIS — L03116 Cellulitis of left lower limb: Secondary | ICD-10-CM | POA: Diagnosis not present

## 2022-09-05 DIAGNOSIS — I82402 Acute embolism and thrombosis of unspecified deep veins of left lower extremity: Secondary | ICD-10-CM | POA: Diagnosis not present

## 2022-09-05 DIAGNOSIS — E11628 Type 2 diabetes mellitus with other skin complications: Secondary | ICD-10-CM | POA: Diagnosis not present

## 2022-09-05 DIAGNOSIS — I824Y2 Acute embolism and thrombosis of unspecified deep veins of left proximal lower extremity: Secondary | ICD-10-CM | POA: Diagnosis not present

## 2022-09-05 DIAGNOSIS — I82492 Acute embolism and thrombosis of other specified deep vein of left lower extremity: Secondary | ICD-10-CM | POA: Diagnosis not present

## 2022-09-05 DIAGNOSIS — Z792 Long term (current) use of antibiotics: Secondary | ICD-10-CM | POA: Diagnosis not present

## 2022-09-05 DIAGNOSIS — Z720 Tobacco use: Secondary | ICD-10-CM | POA: Diagnosis not present

## 2022-09-05 DIAGNOSIS — Z880 Allergy status to penicillin: Secondary | ICD-10-CM | POA: Diagnosis not present

## 2022-09-05 DIAGNOSIS — L03115 Cellulitis of right lower limb: Secondary | ICD-10-CM | POA: Diagnosis not present

## 2022-09-05 DIAGNOSIS — E119 Type 2 diabetes mellitus without complications: Secondary | ICD-10-CM | POA: Diagnosis not present

## 2022-09-05 DIAGNOSIS — G894 Chronic pain syndrome: Secondary | ICD-10-CM | POA: Diagnosis not present

## 2022-09-05 DIAGNOSIS — M797 Fibromyalgia: Secondary | ICD-10-CM | POA: Diagnosis not present

## 2022-09-05 DIAGNOSIS — R339 Retention of urine, unspecified: Secondary | ICD-10-CM | POA: Diagnosis not present

## 2022-09-07 ENCOUNTER — Ambulatory Visit: Payer: 59 | Admitting: Gastroenterology

## 2022-09-08 DIAGNOSIS — F429 Obsessive-compulsive disorder, unspecified: Secondary | ICD-10-CM | POA: Diagnosis not present

## 2022-09-08 DIAGNOSIS — F17210 Nicotine dependence, cigarettes, uncomplicated: Secondary | ICD-10-CM | POA: Diagnosis not present

## 2022-09-08 DIAGNOSIS — F411 Generalized anxiety disorder: Secondary | ICD-10-CM | POA: Diagnosis not present

## 2022-09-08 DIAGNOSIS — F331 Major depressive disorder, recurrent, moderate: Secondary | ICD-10-CM | POA: Diagnosis not present

## 2022-09-14 DIAGNOSIS — N3941 Urge incontinence: Secondary | ICD-10-CM | POA: Diagnosis not present

## 2022-09-15 DIAGNOSIS — G2581 Restless legs syndrome: Secondary | ICD-10-CM | POA: Diagnosis not present

## 2022-09-15 DIAGNOSIS — M5137 Other intervertebral disc degeneration, lumbosacral region: Secondary | ICD-10-CM | POA: Diagnosis not present

## 2022-09-15 DIAGNOSIS — J4489 Other specified chronic obstructive pulmonary disease: Secondary | ICD-10-CM | POA: Diagnosis not present

## 2022-09-15 DIAGNOSIS — G4710 Hypersomnia, unspecified: Secondary | ICD-10-CM | POA: Diagnosis not present

## 2022-09-15 DIAGNOSIS — E1142 Type 2 diabetes mellitus with diabetic polyneuropathy: Secondary | ICD-10-CM | POA: Diagnosis not present

## 2022-09-15 DIAGNOSIS — M1990 Unspecified osteoarthritis, unspecified site: Secondary | ICD-10-CM | POA: Diagnosis not present

## 2022-09-15 DIAGNOSIS — G43909 Migraine, unspecified, not intractable, without status migrainosus: Secondary | ICD-10-CM | POA: Diagnosis not present

## 2022-09-15 DIAGNOSIS — I824Y2 Acute embolism and thrombosis of unspecified deep veins of left proximal lower extremity: Secondary | ICD-10-CM | POA: Diagnosis not present

## 2022-09-15 DIAGNOSIS — M797 Fibromyalgia: Secondary | ICD-10-CM | POA: Diagnosis not present

## 2022-09-19 DIAGNOSIS — L03115 Cellulitis of right lower limb: Secondary | ICD-10-CM | POA: Diagnosis not present

## 2022-09-19 DIAGNOSIS — E1165 Type 2 diabetes mellitus with hyperglycemia: Secondary | ICD-10-CM | POA: Diagnosis not present

## 2022-09-19 DIAGNOSIS — L03116 Cellulitis of left lower limb: Secondary | ICD-10-CM | POA: Diagnosis not present

## 2022-09-19 DIAGNOSIS — I82402 Acute embolism and thrombosis of unspecified deep veins of left lower extremity: Secondary | ICD-10-CM | POA: Diagnosis not present

## 2022-09-19 NOTE — Progress Notes (Unsigned)
Referring Provider: Benetta Spar* Primary Care Physician:  Benetta Spar, MD Primary GI Physician: Dr. Marletta Lor  Chief Complaint  Patient presents with   Follow-up    Still having diarrhea.     HPI:   Alexis Harris is a 52 y.o. female with history of GERD, numerous large, greater than 3 cm, polyps in the colon, adenomatous colon polyps, s/p total colectomy 04/10/2022 with multiple tubular adenomas on pathology, presenting today for follow-up. Dr. Marletta Lor previously recommended flex sig 1 year after total colectomy.   Today:  1-4 Bms per day. Consistency is usually mushy. Overall, satisfied with her current bowel regimen.  She is taking up to 4 loperamide a day and Lomotil 1 or 2 times a day.  Sometimes, she does not take any Lomotil.  She is not currently taking Metamucil as it was not dissolving completely and water and she did not like the taste.  She is eating well.  Weight is stable.  No abdominal pain .  No BRBPR or melena.  GERD is well controlled. Reports some recurrent dysphagia over the last month to bulky items like bread and cake. Wants to monitor for now.   Recently found to have a blood clot in her left lower extremity (June), now on Eliquis.    Past Medical History:  Diagnosis Date   Anxiety    Arthritis    Asthma    Back pain    Carpal tunnel syndrome    COPD (chronic obstructive pulmonary disease) (HCC)    Degenerative cervical disc    Depression    Diabetes mellitus without complication (HCC)    DJD (degenerative joint disease), lumbosacral    Fibromyalgia    GERD (gastroesophageal reflux disease)    Hypersomnia    IBS (irritable bowel syndrome)    Interstitial cystitis    Migraine    Migraines    Neuropathy    OCD (obsessive compulsive disorder)    PONV (postoperative nausea and vomiting)    PTSD (post-traumatic stress disorder)    Restless leg syndrome    Sleep apnea     Past Surgical History:  Procedure Laterality Date    ABDOMINAL HYSTERECTOMY     ABDOMINAL SURGERY     BALLOON DILATION N/A 04/18/2021   Procedure: BALLOON DILATION;  Surgeon: Lanelle Bal, DO;  [facility];  Service: Endoscopy;  Laterality: N/A;   BIOPSY  04/18/2021   Procedure: BIOPSY;  Surgeon: Lanelle Bal, DO;  [facility];  Service: Endoscopy;;   CATARACT EXTRACTION W/PHACO Right 11/10/2014   Procedure: CATARACT EXTRACTION PHACO AND INTRAOCULAR LENS PLACEMENT (IOC);  Surgeon: Jethro Bolus, MD;  Location: [facility];  Service: Ophthalmology;  Laterality: Right;  CDE 2.04   CATARACT EXTRACTION W/PHACO Left 11/24/2014   Procedure: CATARACT EXTRACTION PHACO AND INTRAOCULAR LENS PLACEMENT (IOC);  Surgeon: Jethro Bolus, MD;  Location: [facility];  Service: Ophthalmology;  Laterality: Left;  CDE:1.04   COLONOSCOPY WITH PROPOFOL N/A 01/30/2022   Procedure: COLONOSCOPY WITH PROPOFOL;  Surgeon: Lanelle Bal, DO;  [facility];  Service: Endoscopy;  Laterality: N/A;  11:30am, asa 3   ESOPHAGOGASTRODUODENOSCOPY  09/2015   Dr. Devona Konig, [facility]: esopahgeal lumen appears dilated and diffusely coated with old food. s/p brushing/bx and dilation. Benign squamous mucosal showing surface mucosal necrosis and focal hyperkeratosis. no fungal elements or EOE   ESOPHAGOGASTRODUODENOSCOPY (EGD) WITH PROPOFOL N/A 04/18/2021   Procedure: ESOPHAGOGASTRODUODENOSCOPY (EGD) WITH PROPOFOL;  Surgeon:  Lanelle Bal, DO;  [facility];  Service: Endoscopy;  Laterality: N/A;   EXPLORATORY LAPAROTOMY     x3   FLEXIBLE SIGMOIDOSCOPY  04/18/2021   Procedure: FLEXIBLE SIGMOIDOSCOPY;  Surgeon: Lanelle Bal, DO;  [facility];  Service: Endoscopy;;   interstim medical device implanted     in lower back-to control bladder   MOUTH SURGERY     PARTIAL COLECTOMY N/A 04/10/2022   Procedure: TOTAL COLECTOMY;  Surgeon: Lucretia Roers, MD;  Location: [facility];  Service: General;  Laterality: N/A;    SUBMUCOSAL TATTOO INJECTION  01/30/2022   Procedure: SUBMUCOSAL TATTOO INJECTION;  Surgeon: Lanelle Bal, DO;  [facility];  Service: Endoscopy;;    Current Outpatient Medications  Medication Sig Dispense Refill   albuterol (VENTOLIN HFA) 108 (90 Base) MCG/ACT inhaler Inhale 2 puffs into the lungs every 6 (six) hours as needed for wheezing or shortness of breath.     apixaban (ELIQUIS) 5 MG TABS tablet Take by mouth.     ARIPiprazole (ABILIFY) 10 MG tablet Take 10 mg by mouth in the morning.     Ascorbic Acid (VITAMIN C) 1000 MG tablet Take 1,000 mg by mouth in the morning.     atorvastatin (LIPITOR) 20 MG tablet Take 20 mg by mouth at bedtime.     buPROPion (WELLBUTRIN SR) 150 MG 12 hr tablet Take 150 mg by mouth 2 (two) times daily.     busPIRone (BUSPAR) 15 MG tablet Take 15 mg by mouth 3 (three) times daily.     cetirizine (ZYRTEC) 10 MG tablet Take 10 mg by mouth in the morning.     CINNAMON PO Take 2 tablets by mouth in the morning.     cyclobenzaprine (FLEXERIL) 10 MG tablet Take 10 mg by mouth 3 (three) times daily as needed for muscle spasms.     DULERA 100-5 MCG/ACT AERO Inhale 2 puffs into the lungs 2 (two) times daily.  10   estradiol (ESTRACE) 0.1 MG/GM vaginal cream Place 1 g vaginally 3 (three) times a week.     famotidine (PEPCID) 20 MG tablet TAKE 1 TABLET BY MOUTH EVERYDAY AT BEDTIME 90 tablet 2   fluvoxaMINE (LUVOX) 100 MG tablet Take 150 mg by mouth 2 (two) times daily.     furosemide (LASIX) 20 MG tablet Take 20 mg by mouth in the morning and at bedtime.     gabapentin (NEURONTIN) 400 MG capsule Take 400 mg by mouth 3 (three) times daily.     hydrOXYzine (ATARAX) 50 MG tablet Take 50-100 mg by mouth at bedtime as needed (sleep).     lamoTRIgine (LAMICTAL) 200 MG tablet Take 200 mg by mouth every evening.     loperamide (IMODIUM) 2 MG capsule TAKE 1 CAPSULE (2 MG TOTAL) BY MOUTH 2 (TWO) TIMES DAILY BEFORE A MEAL. 60 capsule 3   metFORMIN (GLUCOPHAGE) 500  MG tablet Take 500 mg by mouth 2 (two) times daily.     montelukast (SINGULAIR) 10 MG tablet Take 10 mg by mouth at bedtime.     Multiple Vitamin (MULTIVITAMIN WITH MINERALS) TABS tablet Take 1 tablet by mouth in the morning.     MYRBETRIQ 50 MG TB24 tablet Take 50 mg by mouth in the morning.     OMEGA 3 1200 MG CAPS Take 1,200 mg by mouth in the morning.     oxymetazoline (AFRIN) 0.05 % nasal spray Place 1 spray into both nostrils 2 (two) times  daily as needed for congestion.     pantoprazole (PROTONIX) 40 MG tablet TAKE 1 TABLET (40 MG TOTAL) BY MOUTH TWICE A DAY BEFORE MEALS 180 tablet 2   prochlorperazine (COMPAZINE) 5 MG tablet Take 1 tablet (5 mg total) by mouth every 6 (six) hours as needed for nausea or vomiting. 30 tablet 1   SUMAtriptan (IMITREX) 100 MG tablet Take 100 mg by mouth every 2 (two) hours as needed for migraine. *TAKE ONE TABLET BY MOUTH AT ONSET OF MIGRAINE. IF MIGRAINE PERSISTS AFTER 2 HOURS, TAKE ONE ADDITIONAL TABLET. *DO NOT EXCEED MORE THAN 2 TABLETS IN A 24 HOUR PERIOD AND DO NOT USE NO MORE THAN 5 TABLETS WITHIN THE PERIOD OF 1 WEEK*     topiramate (TOPAMAX) 50 MG tablet Take 150 mg by mouth in the morning.     traZODone (DESYREL) 50 MG tablet Take 50-150 mg by mouth at bedtime.     UNABLE TO FIND Apply topically as needed. Med Name: **HOME TENS UNIT: FOR LOWER CHRONIC BACK PAIN/BACK PROBLEMS      valACYclovir (VALTREX) 500 MG tablet Take 500 mg by mouth 2 (two) times daily as needed (outbreaks).     buprenorphine (BUTRANS) 10 MCG/HR PTWK Place 1 patch onto the skin every Sunday. (Patient not taking: Reported on 09/21/2022)     diphenoxylate-atropine (LOMOTIL) 2.5-0.025 MG tablet Take 1 tablet by mouth 1-2 times daily to control diarrhea. 60 tablet 5   Phenylephrine-Acetaminophen (SINUS + HEADACHE PO) Take 2 tablets by mouth daily as needed (sinus headaches).     No current facility-administered medications for this visit.    Allergies as of 09/21/2022 - Review  Complete 09/21/2022  Allergen Reaction Noted   Advair diskus [fluticasone-salmeterol] Anaphylaxis 10/20/2011   Other Hives 12/20/2015   Codeine Nausea And Vomiting 10/20/2011   Penicillins Rash 10/20/2011    Family History  Adopted: Yes    Social History   Socioeconomic History   Marital status: Divorced    Spouse name: Not on file   Number of children: Not on file   Years of education: Not on file   Highest education level: Not on file  Occupational History   Not on file  Tobacco Use   Smoking status: Every Day    Packs/day: 0.50    Years: 25.00    Additional pack years: 0.00    Total pack years: 12.50    Types: Cigarettes   Smokeless tobacco: Never  Vaping Use   Vaping Use: Former  Substance and Sexual Activity   Alcohol use: Yes    Comment: occ   Drug use: No   Sexual activity: Yes    Birth control/protection: Surgical    Comment: hyst  Other Topics Concern   Not on file  Social History Narrative   Not on file   Social Determinants of Health   Financial Resource Strain: Medium Risk (08/25/2020)   Overall Financial Resource Strain (CARDIA)    Difficulty of Paying Living Expenses: Somewhat hard  Food Insecurity: No Food Insecurity (04/16/2022)   Hunger Vital Sign    Worried About Running Out of Food in the Last Year: Never true    Ran Out of Food in the Last Year: Never true  Transportation Needs: No Transportation Needs (04/16/2022)   PRAPARE - Administrator, Civil Service (Medical): No    Lack of Transportation (Non-Medical): No  Physical Activity: Inactive (08/25/2020)   Exercise Vital Sign    Days of Exercise per Week: 0  days    Minutes of Exercise per Session: 0 min  Stress: Stress Concern Present (08/25/2020)   [employer] of Occupational Health - Occupational Stress Questionnaire    Feeling of Stress : Very much  Social Connections: Socially Isolated (08/25/2020)   Social Connection and Isolation Panel [NHANES]    Frequency of  Communication with Friends and Family: Twice a week    Frequency of Social Gatherings with Friends and Family: Once a week    Attends Religious Services: Never    Database administrator or Organizations: No    Attends Engineer, structural: Never    Marital Status: Divorced    Review of Systems: Gen: Denies fever, chills, cold or flulike symptoms, presyncope, syncope. CV: Denies chest pain, palpitations. Resp: Denies dyspnea at rest, cough. GI: See HPI Heme: See HPI  Physical Exam: BP (!) 93/59 (BP Location: Right Arm, Patient Position: Sitting, Cuff Size: Normal)   Pulse 91   Temp 98.1 F (36.7 C) (Temporal)   Ht 5\' 4"  (1.626 m)   Wt 190 lb (86.2 kg)   SpO2 95%   BMI 32.61 kg/m  General:   Alert and oriented. No distress noted. Pleasant and cooperative.  Head:  Normocephalic and atraumatic. Eyes:  Conjuctiva clear without scleral icterus. Heart:  S1, S2 present without murmurs appreciated. Lungs:  Clear to auscultation bilaterally. No wheezes, rales, or rhonchi. No distress.  Abdomen:  +BS, soft, non-tender and non-distended. No rebound or guarding. No HSM or masses noted. Msk:  Symmetrical without gross deformities. Normal posture. Extremities:  With 1+ pitting edema in the left lower extremity. Neurologic:  Alert and  oriented x4 Psych:  Normal mood and affect.    Assessment:  52 y.o. female with history of GERD, numerous large, greater than 3 cm, polyps in the colon, adenomatous colon polyps, s/p total colectomy 04/10/2022 with multiple tubular adenomas on pathology, presenting today for follow-up. Dr. Marletta Lor previously recommended flex sig 1 year after total colectomy.  Since colectomy, she developed chronic diarrhea, but this is currently well-controlled with Lomotil and loperamide.  No unintentional weight loss, abdominal pain, BRBPR, or melena.   Her chronic GERD is also well-controlled on pantoprazole 40 mg twice daily.  She does report some recurrent  dysphagia over the last month to bulky items like bread and cake, but prefers to monitor this for now.  Last EGD in January 2023 with no endoscopic abnormality to explain dysphagia s/p empiric dilation.  She does report improvement with last dilation.  Of note, she was recently diagnosed with left lower extremity DVT in June and is now on Eliquis.  If EGD is pursued, would prefer to hold off at least 3 months to allow DVT resolution when she can safely stop Eliquis.  Plan:  Continue Lomotil and loperamide to control diarrhea. Start Benefiber or Metamucil to help bulk up stool. Continue pantoprazole 40 mg twice daily. Dysphagia precautions discussed.  She will let us know if she would like to pursue endoscopy.  Will need to wait at least 3 months, possibly longer to allow for DVT resolution so she can safely discontinue Eliquis for esophageal dilation. Due for flex sig in January 2025. Follow-up in 6 months or sooner if needed.   Ermalinda Memos, PA-C [facility] 09/21/2022

## 2022-09-21 ENCOUNTER — Encounter: Payer: Self-pay | Admitting: Gastroenterology

## 2022-09-21 ENCOUNTER — Ambulatory Visit: Payer: Medicare PPO | Admitting: Gastroenterology

## 2022-09-21 VITALS — BP 93/59 | HR 91 | Temp 98.1°F | Ht 64.0 in | Wt 190.0 lb

## 2022-09-21 DIAGNOSIS — K219 Gastro-esophageal reflux disease without esophagitis: Secondary | ICD-10-CM | POA: Diagnosis not present

## 2022-09-21 DIAGNOSIS — R1310 Dysphagia, unspecified: Secondary | ICD-10-CM

## 2022-09-21 DIAGNOSIS — R197 Diarrhea, unspecified: Secondary | ICD-10-CM | POA: Diagnosis not present

## 2022-09-21 MED ORDER — DIPHENOXYLATE-ATROPINE 2.5-0.025 MG PO TABS: ORAL_TABLET | ORAL | 5 refills | Status: DC

## 2022-09-21 NOTE — Patient Instructions (Signed)
Continue using Lomotil and loperamide to control your diarrhea.    Take 1-2 Lomotil daily.   You can take up to 4 imodium a day.   Start Metamucil or Benefiber as this will help bulk up her stool as well and slow down diarrhea.  You can follow the instructions on the container.  Continue pantoprazole 40 mg twice daily to control your reflux symptoms.  Swallowing precautions:  Eat slowly, take small bites, chew thoroughly, drink plenty of liquids throughout meals.  Avoid trough textures All meats should be chopped finely.  If something gets hung in your esophagus and will not come up or go down, proceed to the emergency room.    Let me know if you have any worsening trouble with swallowing and like to pursue an upper endoscopy.  It was nice to see you today!   Ermalinda Memos, PA-C [facility]

## 2022-09-27 DIAGNOSIS — M1990 Unspecified osteoarthritis, unspecified site: Secondary | ICD-10-CM | POA: Diagnosis not present

## 2022-09-27 DIAGNOSIS — E1142 Type 2 diabetes mellitus with diabetic polyneuropathy: Secondary | ICD-10-CM | POA: Diagnosis not present

## 2022-09-27 DIAGNOSIS — M797 Fibromyalgia: Secondary | ICD-10-CM | POA: Diagnosis not present

## 2022-09-27 DIAGNOSIS — G4710 Hypersomnia, unspecified: Secondary | ICD-10-CM | POA: Diagnosis not present

## 2022-09-27 DIAGNOSIS — G43909 Migraine, unspecified, not intractable, without status migrainosus: Secondary | ICD-10-CM | POA: Diagnosis not present

## 2022-09-27 DIAGNOSIS — J4489 Other specified chronic obstructive pulmonary disease: Secondary | ICD-10-CM | POA: Diagnosis not present

## 2022-09-27 DIAGNOSIS — I824Y2 Acute embolism and thrombosis of unspecified deep veins of left proximal lower extremity: Secondary | ICD-10-CM | POA: Diagnosis not present

## 2022-09-27 DIAGNOSIS — G2581 Restless legs syndrome: Secondary | ICD-10-CM | POA: Diagnosis not present

## 2022-09-27 DIAGNOSIS — M5137 Other intervertebral disc degeneration, lumbosacral region: Secondary | ICD-10-CM | POA: Diagnosis not present

## 2022-09-29 DIAGNOSIS — G43909 Migraine, unspecified, not intractable, without status migrainosus: Secondary | ICD-10-CM | POA: Diagnosis not present

## 2022-09-29 DIAGNOSIS — G4710 Hypersomnia, unspecified: Secondary | ICD-10-CM | POA: Diagnosis not present

## 2022-09-29 DIAGNOSIS — G2581 Restless legs syndrome: Secondary | ICD-10-CM | POA: Diagnosis not present

## 2022-09-29 DIAGNOSIS — M1990 Unspecified osteoarthritis, unspecified site: Secondary | ICD-10-CM | POA: Diagnosis not present

## 2022-09-29 DIAGNOSIS — E1142 Type 2 diabetes mellitus with diabetic polyneuropathy: Secondary | ICD-10-CM | POA: Diagnosis not present

## 2022-09-29 DIAGNOSIS — J4489 Other specified chronic obstructive pulmonary disease: Secondary | ICD-10-CM | POA: Diagnosis not present

## 2022-09-29 DIAGNOSIS — I824Y2 Acute embolism and thrombosis of unspecified deep veins of left proximal lower extremity: Secondary | ICD-10-CM | POA: Diagnosis not present

## 2022-09-29 DIAGNOSIS — M5137 Other intervertebral disc degeneration, lumbosacral region: Secondary | ICD-10-CM | POA: Diagnosis not present

## 2022-09-29 DIAGNOSIS — M797 Fibromyalgia: Secondary | ICD-10-CM | POA: Diagnosis not present

## 2022-10-04 DIAGNOSIS — G2581 Restless legs syndrome: Secondary | ICD-10-CM | POA: Diagnosis not present

## 2022-10-04 DIAGNOSIS — G43909 Migraine, unspecified, not intractable, without status migrainosus: Secondary | ICD-10-CM | POA: Diagnosis not present

## 2022-10-04 DIAGNOSIS — M1990 Unspecified osteoarthritis, unspecified site: Secondary | ICD-10-CM | POA: Diagnosis not present

## 2022-10-04 DIAGNOSIS — G4710 Hypersomnia, unspecified: Secondary | ICD-10-CM | POA: Diagnosis not present

## 2022-10-04 DIAGNOSIS — E1142 Type 2 diabetes mellitus with diabetic polyneuropathy: Secondary | ICD-10-CM | POA: Diagnosis not present

## 2022-10-04 DIAGNOSIS — M5137 Other intervertebral disc degeneration, lumbosacral region: Secondary | ICD-10-CM | POA: Diagnosis not present

## 2022-10-04 DIAGNOSIS — M797 Fibromyalgia: Secondary | ICD-10-CM | POA: Diagnosis not present

## 2022-10-04 DIAGNOSIS — J4489 Other specified chronic obstructive pulmonary disease: Secondary | ICD-10-CM | POA: Diagnosis not present

## 2022-10-04 DIAGNOSIS — I824Y2 Acute embolism and thrombosis of unspecified deep veins of left proximal lower extremity: Secondary | ICD-10-CM | POA: Diagnosis not present

## 2022-10-15 DIAGNOSIS — J4489 Other specified chronic obstructive pulmonary disease: Secondary | ICD-10-CM | POA: Diagnosis not present

## 2022-10-15 DIAGNOSIS — E1142 Type 2 diabetes mellitus with diabetic polyneuropathy: Secondary | ICD-10-CM | POA: Diagnosis not present

## 2022-10-15 DIAGNOSIS — G43909 Migraine, unspecified, not intractable, without status migrainosus: Secondary | ICD-10-CM | POA: Diagnosis not present

## 2022-10-15 DIAGNOSIS — M1990 Unspecified osteoarthritis, unspecified site: Secondary | ICD-10-CM | POA: Diagnosis not present

## 2022-10-15 DIAGNOSIS — G4710 Hypersomnia, unspecified: Secondary | ICD-10-CM | POA: Diagnosis not present

## 2022-10-15 DIAGNOSIS — I824Y2 Acute embolism and thrombosis of unspecified deep veins of left proximal lower extremity: Secondary | ICD-10-CM | POA: Diagnosis not present

## 2022-10-15 DIAGNOSIS — M5137 Other intervertebral disc degeneration, lumbosacral region: Secondary | ICD-10-CM | POA: Diagnosis not present

## 2022-10-15 DIAGNOSIS — G2581 Restless legs syndrome: Secondary | ICD-10-CM | POA: Diagnosis not present

## 2022-10-15 DIAGNOSIS — M797 Fibromyalgia: Secondary | ICD-10-CM | POA: Diagnosis not present

## 2022-10-17 DIAGNOSIS — M797 Fibromyalgia: Secondary | ICD-10-CM | POA: Diagnosis not present

## 2022-10-17 DIAGNOSIS — J449 Chronic obstructive pulmonary disease, unspecified: Secondary | ICD-10-CM | POA: Diagnosis not present

## 2022-10-19 DIAGNOSIS — E1165 Type 2 diabetes mellitus with hyperglycemia: Secondary | ICD-10-CM | POA: Diagnosis not present

## 2022-10-19 DIAGNOSIS — J449 Chronic obstructive pulmonary disease, unspecified: Secondary | ICD-10-CM | POA: Diagnosis not present

## 2022-10-22 ENCOUNTER — Other Ambulatory Visit: Payer: Self-pay | Admitting: General Surgery

## 2022-10-23 NOTE — Progress Notes (Deleted)
[facility] [place]  ASSESSMENT / PLAN: 52 y.o. female with *** - ***  CHIEF COMPLAINT: ***  HISTORY OF PRESENT ILLNESS: Alexis Harris is a 52 y.o. female ***  VASCULAR SURGICAL HISTORY: ***  VASCULAR RISK FACTORS: {FINDINGS; POSITIVE [phone]} history of stroke / transient ischemic attack. {FINDINGS; POSITIVE [phone]} history of coronary artery disease. *** history of PCI. *** history of CABG.  {FINDINGS; POSITIVE [phone]} history of diabetes mellitus. Last A1c ***. {FINDINGS; POSITIVE [phone]} history of smoking. *** actively smoking. {FINDINGS; POSITIVE [phone]} history of hypertension. *** drug regimen with *** control. {FINDINGS; POSITIVE [phone]} history of chronic kidney disease.  Last GFR ***. CKD {stage:30421363}. {FINDINGS; POSITIVE [phone]} history of chronic obstructive pulmonary disease, treated with ***.  FUNCTIONAL STATUS: ECOG performance status: {findings; ecog performance status:31780} Ambulatory status: {TNHAmbulation:25868}  CAREY 1 AND 3 YEAR INDEX Female (2pts) 75-79 or 80-84 (2pts) >84 (3pts) Dependence in toileting (1pt) Partial or full dependence in dressing (1pt) History of malignant neoplasm (2pts) CHF (3pts) COPD (1pts) CKD (3pts)  0-3 pts 6% 1 year mortality ; 21% 3 year mortality 4-5 pts 12% 1 year mortality ; 36% 3 year mortality >5 pts 21% 1 year mortality; 54% 3 year mortality   Past Medical History:  Diagnosis Date   Anxiety    Arthritis    Asthma    Back pain    Carpal tunnel syndrome    COPD (chronic obstructive pulmonary disease) (HCC)    Degenerative cervical disc    Depression    Diabetes mellitus without complication (HCC)    DJD (degenerative joint disease), lumbosacral    Fibromyalgia    GERD (gastroesophageal reflux disease)    Hypersomnia    IBS (irritable bowel syndrome)    Interstitial cystitis    Migraine     Migraines    Neuropathy    OCD (obsessive compulsive disorder)    PONV (postoperative nausea and vomiting)    PTSD (post-traumatic stress disorder)    Restless leg syndrome    Sleep apnea     Past Surgical History:  Procedure Laterality Date   ABDOMINAL HYSTERECTOMY     ABDOMINAL SURGERY     BALLOON DILATION N/A 04/18/2021   Procedure: BALLOON DILATION;  Surgeon: Lanelle Bal, DO;  [facility];  Service: Endoscopy;  Laterality: N/A;   BIOPSY  04/18/2021   Procedure: BIOPSY;  Surgeon: Lanelle Bal, DO;  [facility];  Service: Endoscopy;;   CATARACT EXTRACTION W/PHACO Right 11/10/2014   Procedure: CATARACT EXTRACTION PHACO AND INTRAOCULAR LENS PLACEMENT (IOC);  Surgeon: Jethro Bolus, MD;  Location: [facility];  Service: Ophthalmology;  Laterality: Right;  CDE 2.04   CATARACT EXTRACTION W/PHACO Left 11/24/2014   Procedure: CATARACT EXTRACTION PHACO AND INTRAOCULAR LENS PLACEMENT (IOC);  Surgeon: Jethro Bolus, MD;  Location: [facility];  Service: Ophthalmology;  Laterality: Left;  CDE:1.04   COLONOSCOPY WITH PROPOFOL N/A 01/30/2022   Procedure: COLONOSCOPY WITH PROPOFOL;  Surgeon: Lanelle Bal, DO;  [facility];  Service: Endoscopy;  Laterality: N/A;  11:30am, asa 3   ESOPHAGOGASTRODUODENOSCOPY  09/2015   Dr. Devona Konig, [facility]: esopahgeal lumen appears dilated and diffusely coated with old food. s/p brushing/bx and dilation. Benign squamous mucosal showing surface mucosal necrosis and focal hyperkeratosis. no fungal elements or EOE   ESOPHAGOGASTRODUODENOSCOPY (EGD) WITH PROPOFOL N/A 04/18/2021   Procedure: ESOPHAGOGASTRODUODENOSCOPY (EGD) WITH PROPOFOL;  Surgeon: Lanelle Bal, DO;  [facility];  Service: Endoscopy;  Laterality: N/A;   EXPLORATORY LAPAROTOMY     x3   FLEXIBLE SIGMOIDOSCOPY  04/18/2021   Procedure: FLEXIBLE SIGMOIDOSCOPY;  Surgeon: Lanelle Bal, DO;  [facility];  Service:  Endoscopy;;   interstim medical device implanted     in lower back-to control bladder   MOUTH SURGERY     PARTIAL COLECTOMY N/A 04/10/2022   Procedure: TOTAL COLECTOMY;  Surgeon: Lucretia Roers, MD;  Location: [facility];  Service: General;  Laterality: N/A;   SUBMUCOSAL TATTOO INJECTION  01/30/2022   Procedure: SUBMUCOSAL TATTOO INJECTION;  Surgeon: Lanelle Bal, DO;  [facility];  Service: Endoscopy;;    Family History  Adopted: Yes    Social History   Socioeconomic History   Marital status: Divorced    Spouse name: Not on file   Number of children: Not on file   Years of education: Not on file   Highest education level: Not on file  Occupational History   Not on file  Tobacco Use   Smoking status: Every Day    Current packs/day: 0.50    Average packs/day: 0.5 packs/day for 25.0 years (12.5 ttl pk-yrs)    Types: Cigarettes   Smokeless tobacco: Never  Vaping Use   Vaping status: Former  Substance and Sexual Activity   Alcohol use: Yes    Comment: occ   Drug use: No   Sexual activity: Yes    Birth control/protection: Surgical    Comment: hyst  Other Topics Concern   Not on file  Social History Narrative   Not on file   Social Determinants of Health   Financial Resource Strain: Low Risk  (09/06/2022)   Received from [facility], [facility]   Overall Financial Resource Strain (CARDIA)    Difficulty of Paying Living Expenses: Not hard at all  Food Insecurity: No Food Insecurity (09/06/2022)   Received from [facility], [facility]   Hunger Vital Sign    Worried About Running Out of Food in the Last Year: Never true    Ran Out of Food in the Last Year: Never true  Transportation Needs: No Transportation Needs (09/06/2022)   Received from [facility], [facility]   [facility] - Transportation    Lack of Transportation (Medical): No    Lack of Transportation (Non-Medical): No  Physical Activity: Inactive (08/25/2020)   Exercise  Vital Sign    Days of Exercise per Week: 0 days    Minutes of Exercise per Session: 0 min  Stress: Stress Concern Present (08/25/2020)   [employer] of Occupational Health - Occupational Stress Questionnaire    Feeling of Stress : Very much  Social Connections: Socially Isolated (08/25/2020)   Social Connection and Isolation Panel [NHANES]    Frequency of Communication with Friends and Family: Twice a week    Frequency of Social Gatherings with Friends and Family: Once a week    Attends Religious Services: Never    Database administrator or Organizations: No    Attends Banker Meetings: Never    Marital Status: Divorced  Catering manager Violence: Not At Risk (04/16/2022)   Humiliation, Afraid, Rape, and Kick questionnaire    Fear of Current or Ex-Partner: No    Emotionally Abused: No    Physically Abused: No    Sexually Abused: No    Allergies  Allergen Reactions   Advair Diskus [Fluticasone-Salmeterol] Anaphylaxis   Other Hives    wool  Codeine Nausea And Vomiting   Penicillins Rash    Current Outpatient Medications  Medication Sig Dispense Refill   albuterol (VENTOLIN HFA) 108 (90 Base) MCG/ACT inhaler Inhale 2 puffs into the lungs every 6 (six) hours as needed for wheezing or shortness of breath.     apixaban (ELIQUIS) 5 MG TABS tablet Take by mouth.     ARIPiprazole (ABILIFY) 10 MG tablet Take 10 mg by mouth in the morning.     Ascorbic Acid (VITAMIN C) 1000 MG tablet Take 1,000 mg by mouth in the morning.     atorvastatin (LIPITOR) 20 MG tablet Take 20 mg by mouth at bedtime.     buprenorphine (BUTRANS) 10 MCG/HR PTWK Place 1 patch onto the skin every Sunday. (Patient not taking: Reported on 09/21/2022)     buPROPion (WELLBUTRIN SR) 150 MG 12 hr tablet Take 150 mg by mouth 2 (two) times daily.     busPIRone (BUSPAR) 15 MG tablet Take 15 mg by mouth 3 (three) times daily.     cetirizine (ZYRTEC) 10 MG tablet Take 10 mg by mouth in the morning.      CINNAMON PO Take 2 tablets by mouth in the morning.     cyclobenzaprine (FLEXERIL) 10 MG tablet Take 10 mg by mouth 3 (three) times daily as needed for muscle spasms.     diphenoxylate-atropine (LOMOTIL) 2.5-0.025 MG tablet Take 1 tablet by mouth 1-2 times daily to control diarrhea. 60 tablet 5   DULERA 100-5 MCG/ACT AERO Inhale 2 puffs into the lungs 2 (two) times daily.  10   estradiol (ESTRACE) 0.1 MG/GM vaginal cream Place 1 g vaginally 3 (three) times a week.     famotidine (PEPCID) 20 MG tablet TAKE 1 TABLET BY MOUTH EVERYDAY AT BEDTIME 90 tablet 2   fluvoxaMINE (LUVOX) 100 MG tablet Take 150 mg by mouth 2 (two) times daily.     furosemide (LASIX) 20 MG tablet Take 20 mg by mouth in the morning and at bedtime.     gabapentin (NEURONTIN) 400 MG capsule Take 400 mg by mouth 3 (three) times daily.     hydrOXYzine (ATARAX) 50 MG tablet Take 50-100 mg by mouth at bedtime as needed (sleep).     lamoTRIgine (LAMICTAL) 200 MG tablet Take 200 mg by mouth every evening.     loperamide (IMODIUM) 2 MG capsule TAKE 1 CAPSULE (2 MG TOTAL) BY MOUTH 2 (TWO) TIMES DAILY BEFORE A MEAL. 60 capsule 3   metFORMIN (GLUCOPHAGE) 500 MG tablet Take 500 mg by mouth 2 (two) times daily.     montelukast (SINGULAIR) 10 MG tablet Take 10 mg by mouth at bedtime.     Multiple Vitamin (MULTIVITAMIN WITH MINERALS) TABS tablet Take 1 tablet by mouth in the morning.     MYRBETRIQ 50 MG TB24 tablet Take 50 mg by mouth in the morning.     OMEGA 3 1200 MG CAPS Take 1,200 mg by mouth in the morning.     oxymetazoline (AFRIN) 0.05 % nasal spray Place 1 spray into both nostrils 2 (two) times daily as needed for congestion.     pantoprazole (PROTONIX) 40 MG tablet TAKE 1 TABLET (40 MG TOTAL) BY MOUTH TWICE A DAY BEFORE MEALS 180 tablet 2   Phenylephrine-Acetaminophen (SINUS + HEADACHE PO) Take 2 tablets by mouth daily as needed (sinus headaches).     prochlorperazine (COMPAZINE) 5 MG tablet Take 1 tablet (5 mg total) by mouth every  6 (six) hours as needed for nausea  or vomiting. 30 tablet 1   SUMAtriptan (IMITREX) 100 MG tablet Take 100 mg by mouth every 2 (two) hours as needed for migraine. *TAKE ONE TABLET BY MOUTH AT ONSET OF MIGRAINE. IF MIGRAINE PERSISTS AFTER 2 HOURS, TAKE ONE ADDITIONAL TABLET. *DO NOT EXCEED MORE THAN 2 TABLETS IN A 24 HOUR PERIOD AND DO NOT USE NO MORE THAN 5 TABLETS WITHIN THE PERIOD OF 1 WEEK*     topiramate (TOPAMAX) 50 MG tablet Take 150 mg by mouth in the morning.     traZODone (DESYREL) 50 MG tablet Take 50-150 mg by mouth at bedtime.     UNABLE TO FIND Apply topically as needed. Med Name: **HOME TENS UNIT: FOR LOWER CHRONIC BACK PAIN/BACK PROBLEMS      valACYclovir (VALTREX) 500 MG tablet Take 500 mg by mouth 2 (two) times daily as needed (outbreaks).     No current facility-administered medications for this visit.    PHYSICAL EXAM There were no vitals filed for this visit.  Constitutional: *** appearing. *** distress. Appears *** nourished.  Neurologic: CN ***. *** focal findings. *** sensory loss. Psychiatric: *** Mood and affect symmetric and appropriate. Eyes: *** No icterus. No conjunctival pallor. Ears, nose, throat: *** mucous membranes moist. Midline trachea.  Cardiac: *** rate and rhythm.  Respiratory: *** unlabored. Abdominal: *** soft, non-tender, non-distended.  Peripheral vascular: *** Extremity: *** edema. *** cyanosis. *** pallor.  Skin: *** gangrene. *** ulceration.  Lymphatic: *** Stemmer's sign. *** palpable lymphadenopathy.    PERTINENT LABORATORY AND RADIOLOGIC DATA  Most recent CBC    Latest Ref Rng & Units 04/19/2022    3:04 AM 04/18/2022    4:05 AM 04/17/2022    4:43 AM  CBC  WBC 4.0 - 10.5 K/uL 10.5  9.4  10.5   Hemoglobin 12.0 - 15.0 g/dL 57.8  46.9  62.9   Hematocrit 36.0 - 46.0 % 35.4  37.0  39.2   Platelets 150 - 400 K/uL 422  389  421      Most recent CMP    Latest Ref Rng & Units 04/21/2022    3:13 AM 04/20/2022    3:15 AM 04/19/2022     3:04 AM  CMP  Glucose 70 - 99 mg/dL 85  528  413   BUN 6 - 20 mg/dL 9  11  14    Creatinine 0.44 - 1.00 mg/dL 2.44  0.10  2.72   Sodium 135 - 145 mmol/L 131  129  128   Potassium 3.5 - 5.1 mmol/L 4.0  3.1  3.5   Chloride 98 - 111 mmol/L 99  98  97   CO2 22 - 32 mmol/L 20  21  20    Calcium 8.9 - 10.3 mg/dL 8.6  8.4  8.2     Renal function CrCl cannot be calculated (Patient's most recent lab result is older than the maximum 21 days allowed.).  Hgb A1c MFr Bld (%)  Date Value  04/07/2022 6.6 (H)    No results found for: "LDLCALC", "LDLC", "HIRISKLDL", "POCLDL", "LDLDIRECT", "REALLDLC", "TOTLDLC"   Vascular Imaging: ***   N. Lenell Antu, MD FACS [facility] Phone Number: [phone] 10/23/2022 2:20 PM   Total time spent on preparing this encounter including chart review, data review, collecting history, examining the patient, coordinating care for this {tnhtimebilling:26202}  Portions of this report may have been transcribed using voice recognition software.  Every effort has been made to ensure accuracy; however, inadvertent computerized transcription errors  may still be present.

## 2022-10-24 ENCOUNTER — Encounter: Payer: Medicare PPO | Admitting: Vascular Surgery

## 2022-10-24 DIAGNOSIS — G2581 Restless legs syndrome: Secondary | ICD-10-CM | POA: Diagnosis not present

## 2022-10-24 DIAGNOSIS — M1990 Unspecified osteoarthritis, unspecified site: Secondary | ICD-10-CM | POA: Diagnosis not present

## 2022-10-24 DIAGNOSIS — I824Y2 Acute embolism and thrombosis of unspecified deep veins of left proximal lower extremity: Secondary | ICD-10-CM | POA: Diagnosis not present

## 2022-10-24 DIAGNOSIS — E1142 Type 2 diabetes mellitus with diabetic polyneuropathy: Secondary | ICD-10-CM | POA: Diagnosis not present

## 2022-10-24 DIAGNOSIS — M797 Fibromyalgia: Secondary | ICD-10-CM | POA: Diagnosis not present

## 2022-10-24 DIAGNOSIS — M5137 Other intervertebral disc degeneration, lumbosacral region: Secondary | ICD-10-CM | POA: Diagnosis not present

## 2022-10-24 DIAGNOSIS — G43909 Migraine, unspecified, not intractable, without status migrainosus: Secondary | ICD-10-CM | POA: Diagnosis not present

## 2022-10-24 DIAGNOSIS — G4710 Hypersomnia, unspecified: Secondary | ICD-10-CM | POA: Diagnosis not present

## 2022-10-24 DIAGNOSIS — J4489 Other specified chronic obstructive pulmonary disease: Secondary | ICD-10-CM | POA: Diagnosis not present

## 2022-11-01 ENCOUNTER — Other Ambulatory Visit: Payer: Self-pay | Admitting: Gastroenterology

## 2022-11-01 ENCOUNTER — Encounter: Payer: Self-pay | Admitting: *Deleted

## 2022-11-01 ENCOUNTER — Telehealth: Payer: Self-pay | Admitting: *Deleted

## 2022-11-01 DIAGNOSIS — R197 Diarrhea, unspecified: Secondary | ICD-10-CM

## 2022-11-01 MED ORDER — LOPERAMIDE HCL 2 MG PO CAPS
2.0000 mg | ORAL_CAPSULE | Freq: Two times a day (BID) | ORAL | 3 refills | Status: DC
Start: 2022-11-01 — End: 2023-02-27

## 2022-11-01 NOTE — Telephone Encounter (Signed)
Discontinue loperamide or Lomotil?  I prescribed Lomotil at her last office visit.

## 2022-11-01 NOTE — Telephone Encounter (Signed)
Noted, informed pt.  °

## 2022-11-01 NOTE — Telephone Encounter (Signed)
Pt needs loperamide 2mg  sent to [facility] in [place]. Pt last OV 09/21/2022

## 2022-11-01 NOTE — Telephone Encounter (Signed)
Rx sent 

## 2022-11-01 NOTE — Telephone Encounter (Signed)
Spoke to pt, she informed me that she is taking loperamide twice daily and needs a refill. She states she is taking both.

## 2022-11-03 DIAGNOSIS — F411 Generalized anxiety disorder: Secondary | ICD-10-CM | POA: Diagnosis not present

## 2022-11-03 DIAGNOSIS — F429 Obsessive-compulsive disorder, unspecified: Secondary | ICD-10-CM | POA: Diagnosis not present

## 2022-11-03 DIAGNOSIS — F17210 Nicotine dependence, cigarettes, uncomplicated: Secondary | ICD-10-CM | POA: Diagnosis not present

## 2022-11-03 DIAGNOSIS — F331 Major depressive disorder, recurrent, moderate: Secondary | ICD-10-CM | POA: Diagnosis not present

## 2022-11-13 DIAGNOSIS — J4489 Other specified chronic obstructive pulmonary disease: Secondary | ICD-10-CM | POA: Diagnosis not present

## 2022-11-13 DIAGNOSIS — G43909 Migraine, unspecified, not intractable, without status migrainosus: Secondary | ICD-10-CM | POA: Diagnosis not present

## 2022-11-13 DIAGNOSIS — G4710 Hypersomnia, unspecified: Secondary | ICD-10-CM | POA: Diagnosis not present

## 2022-11-13 DIAGNOSIS — E1142 Type 2 diabetes mellitus with diabetic polyneuropathy: Secondary | ICD-10-CM | POA: Diagnosis not present

## 2022-11-13 DIAGNOSIS — M1990 Unspecified osteoarthritis, unspecified site: Secondary | ICD-10-CM | POA: Diagnosis not present

## 2022-11-13 DIAGNOSIS — I824Y2 Acute embolism and thrombosis of unspecified deep veins of left proximal lower extremity: Secondary | ICD-10-CM | POA: Diagnosis not present

## 2022-11-13 DIAGNOSIS — G2581 Restless legs syndrome: Secondary | ICD-10-CM | POA: Diagnosis not present

## 2022-11-13 DIAGNOSIS — M5137 Other intervertebral disc degeneration, lumbosacral region: Secondary | ICD-10-CM | POA: Diagnosis not present

## 2022-11-13 DIAGNOSIS — M797 Fibromyalgia: Secondary | ICD-10-CM | POA: Diagnosis not present

## 2022-11-14 ENCOUNTER — Ambulatory Visit: Payer: Medicare PPO | Admitting: Vascular Surgery

## 2022-11-14 ENCOUNTER — Encounter: Payer: Self-pay | Admitting: Vascular Surgery

## 2022-11-14 VITALS — BP 104/74 | HR 82 | Temp 98.4°F | Resp 16 | Ht 64.0 in | Wt 210.0 lb

## 2022-11-14 DIAGNOSIS — I82412 Acute embolism and thrombosis of left femoral vein: Secondary | ICD-10-CM | POA: Diagnosis not present

## 2022-11-14 NOTE — Progress Notes (Signed)
Office Note     CC: Bilateral lower extremity edema with left-sided venous thrombosis Requesting Provider:  Benetta Spar*  HPI: Alexis Harris is a 52 y.o. ([birth year]) female who presents at the request of Fanta, Tesfaye Demissie, MD for evaluation of bilateral lower extremity edema with left lower extremity deep venous thrombosis.  On exam today, Alexis Harris was doing well, accompanied by her sister.  She is a native of [facility], and has lived there her entire life.  Alexis Harris presented to the emergency room with left lower extremity swelling in June, and was found to have a DVT.  She was started on Eliquis, and asked to follow-up with vascular surgery.  She continues to be compliant with her Eliquis, but has not appreciated progression in left lower extremity swelling.  She notes heaviness and sometimes aching by days end.  The leg is relatively normal in size in the mornings, and is worse by days end.  She has not worn compression stockings.  No prior vascular venous surgeries.  Lifelong smoker    Past Medical History:  Diagnosis Date   Anxiety    Arthritis    Asthma    Back pain    Carpal tunnel syndrome    COPD (chronic obstructive pulmonary disease) (HCC)    Degenerative cervical disc    Depression    Diabetes mellitus without complication (HCC)    DJD (degenerative joint disease), lumbosacral    Fibromyalgia    GERD (gastroesophageal reflux disease)    Hypersomnia    IBS (irritable bowel syndrome)    Interstitial cystitis    Migraine    Migraines    Neuropathy    OCD (obsessive compulsive disorder)    PONV (postoperative nausea and vomiting)    PTSD (post-traumatic stress disorder)    Restless leg syndrome    Sleep apnea    reports resolved    Past Surgical History:  Procedure Laterality Date   ABDOMINAL HYSTERECTOMY     ABDOMINAL SURGERY     BALLOON DILATION N/A 04/18/2021   Procedure: BALLOON DILATION;  Surgeon: Lanelle Bal, DO;   [facility];  Service: Endoscopy;  Laterality: N/A;   BIOPSY  04/18/2021   Procedure: BIOPSY;  Surgeon: Lanelle Bal, DO;  [facility];  Service: Endoscopy;;   CATARACT EXTRACTION W/PHACO Right 11/10/2014   Procedure: CATARACT EXTRACTION PHACO AND INTRAOCULAR LENS PLACEMENT (IOC);  Surgeon: Jethro Bolus, MD;  Location: [facility];  Service: Ophthalmology;  Laterality: Right;  CDE 2.04   CATARACT EXTRACTION W/PHACO Left 11/24/2014   Procedure: CATARACT EXTRACTION PHACO AND INTRAOCULAR LENS PLACEMENT (IOC);  Surgeon: Jethro Bolus, MD;  Location: [facility];  Service: Ophthalmology;  Laterality: Left;  CDE:1.04   COLONOSCOPY WITH PROPOFOL N/A 01/30/2022   Procedure: COLONOSCOPY WITH PROPOFOL;  Surgeon: Lanelle Bal, DO;  [facility];  Service: Endoscopy;  Laterality: N/A;  11:30am, asa 3   ESOPHAGOGASTRODUODENOSCOPY  09/2015   Dr. Devona Konig, [facility]: esopahgeal lumen appears dilated and diffusely coated with old food. s/p brushing/bx and dilation. Benign squamous mucosal showing surface mucosal necrosis and focal hyperkeratosis. no fungal elements or EOE   ESOPHAGOGASTRODUODENOSCOPY (EGD) WITH PROPOFOL N/A 04/18/2021   Procedure: ESOPHAGOGASTRODUODENOSCOPY (EGD) WITH PROPOFOL;  Surgeon: Lanelle Bal, DO;  [facility];  Service: Endoscopy;  Laterality: N/A;   EXPLORATORY LAPAROTOMY     x3   FLEXIBLE SIGMOIDOSCOPY  04/18/2021   Procedure: FLEXIBLE SIGMOIDOSCOPY;  Surgeon: Earnest Bailey  K, DO;  [facility];  Service: Endoscopy;;   interstim medical device implanted     in lower back-to control bladder   MOUTH SURGERY     PARTIAL COLECTOMY N/A 04/10/2022   Procedure: TOTAL COLECTOMY;  Surgeon: Lucretia Roers, MD;  Location: [facility];  Service: General;  Laterality: N/A;   SUBMUCOSAL TATTOO INJECTION  01/30/2022   Procedure: SUBMUCOSAL TATTOO INJECTION;  Surgeon: Lanelle Bal, DO;  [facility];   Service: Endoscopy;;    Social History   Socioeconomic History   Marital status: Divorced    Spouse name: Not on file   Number of children: Not on file   Years of education: Not on file   Highest education level: Not on file  Occupational History   Not on file  Tobacco Use   Smoking status: Every Day    Current packs/day: 0.50    Average packs/day: 0.5 packs/day for 25.0 years (12.5 ttl pk-yrs)    Types: Cigarettes   Smokeless tobacco: Never  Vaping Use   Vaping status: Former  Substance and Sexual Activity   Alcohol use: Yes    Comment: occ   Drug use: No   Sexual activity: Yes    Birth control/protection: Surgical    Comment: hyst  Other Topics Concern   Not on file  Social History Narrative   Not on file   Social Determinants of Health   Financial Resource Strain: Low Risk  (09/06/2022)   Received from [facility], [facility]   Overall Financial Resource Strain (CARDIA)    Difficulty of Paying Living Expenses: Not hard at all  Food Insecurity: No Food Insecurity (09/06/2022)   Received from [facility], [facility]   Hunger Vital Sign    Worried About Running Out of Food in the Last Year: Never true    Ran Out of Food in the Last Year: Never true  Transportation Needs: No Transportation Needs (09/06/2022)   Received from [facility], [facility]   [facility] - Transportation    Lack of Transportation (Medical): No    Lack of Transportation (Non-Medical): No  Physical Activity: Inactive (08/25/2020)   Exercise Vital Sign    Days of Exercise per Week: 0 days    Minutes of Exercise per Session: 0 min  Stress: Stress Concern Present (08/25/2020)   [employer] of Occupational Health - Occupational Stress Questionnaire    Feeling of Stress : Very much  Social Connections: Socially Isolated (08/25/2020)   Social Connection and Isolation Panel [NHANES]    Frequency of Communication with Friends and Family: Twice a week    Frequency of Social  Gatherings with Friends and Family: Once a week    Attends Religious Services: Never    Database administrator or Organizations: No    Attends Banker Meetings: Never    Marital Status: Divorced  Catering manager Violence: Not At Risk (04/16/2022)   Humiliation, Afraid, Rape, and Kick questionnaire    Fear of Current or Ex-Partner: No    Emotionally Abused: No    Physically Abused: No    Sexually Abused: No   Family History  Adopted: Yes    Current Outpatient Medications  Medication Sig Dispense Refill   albuterol (VENTOLIN HFA) 108 (90 Base) MCG/ACT inhaler Inhale 2 puffs into the lungs every 6 (six) hours as needed for wheezing or shortness of breath.     apixaban (ELIQUIS) 5 MG TABS  tablet Take by mouth.     ARIPiprazole (ABILIFY) 10 MG tablet Take 10 mg by mouth in the morning.     Ascorbic Acid (VITAMIN C) 1000 MG tablet Take 1,000 mg by mouth in the morning.     atorvastatin (LIPITOR) 20 MG tablet Take 20 mg by mouth at bedtime.     buprenorphine (BUTRANS) 10 MCG/HR PTWK Place 1 patch onto the skin every Sunday.     buPROPion (WELLBUTRIN SR) 150 MG 12 hr tablet Take 150 mg by mouth 2 (two) times daily.     busPIRone (BUSPAR) 15 MG tablet Take 15 mg by mouth 3 (three) times daily.     cetirizine (ZYRTEC) 10 MG tablet Take 10 mg by mouth in the morning.     CINNAMON PO Take 2 tablets by mouth in the morning.     cyclobenzaprine (FLEXERIL) 10 MG tablet Take 10 mg by mouth 3 (three) times daily as needed for muscle spasms.     diphenoxylate-atropine (LOMOTIL) 2.5-0.025 MG tablet Take 1 tablet by mouth 1-2 times daily to control diarrhea. 60 tablet 5   estradiol (ESTRACE) 0.1 MG/GM vaginal cream Place 1 g vaginally 3 (three) times a week.     famotidine (PEPCID) 20 MG tablet TAKE 1 TABLET BY MOUTH EVERYDAY AT BEDTIME 90 tablet 2   fluticasone furoate-vilanterol (BREO ELLIPTA) 100-25 MCG/ACT AEPB Inhale 1 puff into the lungs daily.     fluvoxaMINE (LUVOX) 100 MG tablet  Take 150 mg by mouth 2 (two) times daily.     furosemide (LASIX) 20 MG tablet Take 20 mg by mouth in the morning and at bedtime.     gabapentin (NEURONTIN) 300 MG capsule Take 300 mg by mouth 3 (three) times daily.     guaiFENesin (MUCINEX) 600 MG 12 hr tablet Take 600 mg by mouth daily as needed.     hydrOXYzine (ATARAX) 50 MG tablet Take 50-100 mg by mouth at bedtime as needed (sleep).     lamoTRIgine (LAMICTAL) 200 MG tablet Take 200 mg by mouth every evening.     loperamide (IMODIUM) 2 MG capsule Take 1 capsule (2 mg total) by mouth 2 (two) times daily before a meal. 60 capsule 3   metFORMIN (GLUCOPHAGE) 500 MG tablet Take 500 mg by mouth 2 (two) times daily.     montelukast (SINGULAIR) 10 MG tablet Take 10 mg by mouth at bedtime.     Multiple Vitamin (MULTIVITAMIN WITH MINERALS) TABS tablet Take 1 tablet by mouth in the morning.     MYRBETRIQ 50 MG TB24 tablet Take 50 mg by mouth in the morning.     OMEGA 3 1200 MG CAPS Take 1,200 mg by mouth in the morning.     oxymetazoline (AFRIN) 0.05 % nasal spray Place 1 spray into both nostrils 2 (two) times daily as needed for congestion.     pantoprazole (PROTONIX) 40 MG tablet TAKE 1 TABLET (40 MG TOTAL) BY MOUTH TWICE A DAY BEFORE MEALS 180 tablet 2   Phenylephrine-Acetaminophen (SINUS + HEADACHE PO) Take 2 tablets by mouth daily as needed (sinus headaches).     SUMAtriptan (IMITREX) 100 MG tablet Take 100 mg by mouth every 2 (two) hours as needed for migraine. *TAKE ONE TABLET BY MOUTH AT ONSET OF MIGRAINE. IF MIGRAINE PERSISTS AFTER 2 HOURS, TAKE ONE ADDITIONAL TABLET. *DO NOT EXCEED MORE THAN 2 TABLETS IN A 24 HOUR PERIOD AND DO NOT USE NO MORE THAN 5 TABLETS WITHIN THE PERIOD OF 1 WEEK*  topiramate (TOPAMAX) 50 MG tablet Take 150 mg by mouth in the morning.     traZODone (DESYREL) 50 MG tablet Take 50-150 mg by mouth at bedtime.     UNABLE TO FIND Apply topically as needed. Med Name: **HOME TENS UNIT: FOR LOWER CHRONIC BACK PAIN/BACK PROBLEMS       valACYclovir (VALTREX) 500 MG tablet Take 500 mg by mouth 2 (two) times daily as needed (outbreaks).     No current facility-administered medications for this visit.    Allergies  Allergen Reactions   Advair Diskus [Fluticasone-Salmeterol] Anaphylaxis   Other Hives    wool   Codeine Nausea And Vomiting   Penicillins Rash     REVIEW OF SYSTEMS:  [X]  denotes positive finding, [ ]  denotes negative finding Cardiac  Comments:  Chest pain or chest pressure:    Shortness of breath upon exertion:    Short of breath when lying flat:    Irregular heart rhythm:        Vascular    Pain in calf, thigh, or hip brought on by ambulation:    Pain in feet at night that wakes you up from your sleep:     Blood clot in your veins:    Leg swelling:         Pulmonary    Oxygen at home:    Productive cough:     Wheezing:         Neurologic    Sudden weakness in arms or legs:     Sudden numbness in arms or legs:     Sudden onset of difficulty speaking or slurred speech:    Temporary loss of vision in one eye:     Problems with dizziness:         Gastrointestinal    Blood in stool:     Vomited blood:         Genitourinary    Burning when urinating:     Blood in urine:        Psychiatric    Major depression:         Hematologic    Bleeding problems:    Problems with blood clotting too easily:        Skin    Rashes or ulcers:        Constitutional    Fever or chills:      PHYSICAL EXAMINATION:  There were no vitals filed for this visit.  General:  WDWN in NAD; vital signs documented above Gait: Not observed HENT: WNL, normocephalic Pulmonary: normal non-labored breathing , without Rales, rhonchi,  wheezing Cardiac: regular HR Abdomen: soft, NT, no masses Skin: without rashes Vascular Exam/Pulses:  Right Left  Radial 2+ (normal) 2+ (normal)  Ulnar    Femoral    Popliteal    DP    PT 2+ (normal) 2+ (normal)   Extremities: without ischemic changes, without  Gangrene , without cellulitis; without open wounds;  Edema in bilateral lower extremities extending from the calf to the ankles.  No chronic skin changes Musculoskeletal: no muscle wasting or atrophy  Neurologic: A&O X 3;  No focal weakness or paresthesias are detected Psychiatric:  The pt has Normal affect.   Non-Invasive Vascular Imaging:   No new imaging    ASSESSMENT/PLAN:: 52 y.o. female presenting with known left lower extremity DVT, which was diagnosed on 09/07/2022 when she presented to the hospital with bilateral lower extremity cellulitis.  Since discharge, she has been doing well.  She continues  to have some bilateral lower extremity swelling, left greater than right.  She denies symptoms of claudication, ischemic rest pain, tissue loss.  Cayse has bilateral lower extremity edema and symptoms consistent with chronic venous insufficiency.  On the left, this is magnified with recent DVT, now with symptoms consistent with post thrombotic syndrome.  We discussed the importance of wearing compression stockings -she was sized and given a pair in our office today.  I have evaluated all of her paperwork, and have discussed the events leading up to her DVT.  At this time, I think the DVT should be treated is unprovoked.  Interestingly her faxed paper chart, notes a history of diffuse large B-cell lymphoma.  I do not see any history in epic, nor was the patient aware of any lymphoma history.    I plan to refer her to hematology oncology for hypercoagulable workup versus malignancy.  Recent CTA of the chest demonstrates lung RADS 2 lesion.  I have also ordered a repeat ultrasound of the left lower extremity to assess the DVT.  I will call her with these results.  Being that her symptoms remain mild, will defer chronic venous insufficiency workup at this time.  She was asked to call my office should symptoms worsen.  I plan to call her in the coming weeks.   Victorino Sparrow, MD [facility] [phone]

## 2022-11-15 ENCOUNTER — Other Ambulatory Visit: Payer: Self-pay

## 2022-11-15 DIAGNOSIS — F17200 Nicotine dependence, unspecified, uncomplicated: Secondary | ICD-10-CM | POA: Diagnosis not present

## 2022-11-15 DIAGNOSIS — E669 Obesity, unspecified: Secondary | ICD-10-CM | POA: Diagnosis not present

## 2022-11-15 DIAGNOSIS — R062 Wheezing: Secondary | ICD-10-CM | POA: Diagnosis not present

## 2022-11-15 DIAGNOSIS — I82412 Acute embolism and thrombosis of left femoral vein: Secondary | ICD-10-CM

## 2022-11-16 ENCOUNTER — Ambulatory Visit (HOSPITAL_COMMUNITY)
Admission: RE | Admit: 2022-11-16 | Discharge: 2022-11-16 | Disposition: A | Discharge status: Home / Self Care | Payer: Medicare PPO | Source: Ambulatory Visit | Attending: Surgery | Admitting: Surgery

## 2022-11-16 DIAGNOSIS — I82412 Acute embolism and thrombosis of left femoral vein: Secondary | ICD-10-CM

## 2022-11-19 DIAGNOSIS — E1165 Type 2 diabetes mellitus with hyperglycemia: Secondary | ICD-10-CM | POA: Diagnosis not present

## 2022-11-19 DIAGNOSIS — J449 Chronic obstructive pulmonary disease, unspecified: Secondary | ICD-10-CM | POA: Diagnosis not present

## 2022-11-21 ENCOUNTER — Ambulatory Visit (INDEPENDENT_AMBULATORY_CARE_PROVIDER_SITE_OTHER): Payer: Self-pay | Admitting: Vascular Surgery

## 2022-11-21 DIAGNOSIS — M7989 Other specified soft tissue disorders: Secondary | ICD-10-CM

## 2022-11-21 NOTE — Progress Notes (Signed)
Patient's left lower extremity venous ultrasound reviewed demonstrating widely patent venous system, no thrombus. Eulalah's lower extremity symptoms are likely due to bilateral venous insufficiency magnified by left-sided post thrombotic syndrome. Symptoms remain mild.   I called her and updated her on the above.  I asked her to continue her Eliquis for the time being, and will let hematology oncology decide if the medication should be discontinued pending malignancy/hypercoagulable workup as to the DVT was unprovoked.  Victorino Sparrow MD

## 2022-11-29 ENCOUNTER — Encounter: Payer: Self-pay | Admitting: Oncology

## 2022-11-29 ENCOUNTER — Other Ambulatory Visit: Payer: Self-pay | Admitting: Oncology

## 2022-11-29 ENCOUNTER — Inpatient Hospital Stay: Payer: Medicare PPO | Attending: Oncology | Admitting: Oncology

## 2022-11-29 VITALS — BP 117/74 | HR 92 | Temp 98.2°F | Resp 18 | Ht 64.0 in | Wt 206.5 lb

## 2022-11-29 DIAGNOSIS — F17210 Nicotine dependence, cigarettes, uncomplicated: Secondary | ICD-10-CM | POA: Diagnosis not present

## 2022-11-29 DIAGNOSIS — D369 Benign neoplasm, unspecified site: Secondary | ICD-10-CM | POA: Diagnosis not present

## 2022-11-29 DIAGNOSIS — I82462 Acute embolism and thrombosis of left calf muscular vein: Secondary | ICD-10-CM | POA: Diagnosis not present

## 2022-11-29 DIAGNOSIS — Z720 Tobacco use: Secondary | ICD-10-CM | POA: Insufficient documentation

## 2022-11-29 DIAGNOSIS — I82409 Acute embolism and thrombosis of unspecified deep veins of unspecified lower extremity: Secondary | ICD-10-CM | POA: Insufficient documentation

## 2022-11-29 DIAGNOSIS — Z79899 Other long term (current) drug therapy: Secondary | ICD-10-CM | POA: Diagnosis not present

## 2022-11-29 DIAGNOSIS — R296 Repeated falls: Secondary | ICD-10-CM

## 2022-11-29 NOTE — Assessment & Plan Note (Signed)
Patient has a 20-year pack history Was smoking 1/2 pack/day for several years and recently cut down a little  Discussed smoking cessation in detail and offered to provide alternative support Patient said she is working on it and would ask [place] if she needs any more support

## 2022-11-29 NOTE — Assessment & Plan Note (Signed)
Likely provoked with multiple risk [cellulitis, smoking, diabetes] patient Patient also reports falling frequently increasing the risk of bleeding Risks versus benefits of anticoagulation (AC) discussed in detail with the patient Patient will need 3 months of anticoagulation in the setting of provoked clot But considering patient is a current smoker, can repeat a d-dimer at the 3 month mark If D-dimer is normal, with the recent US showing no clot, can DC AC at that time If D-dimer is elevated, can discuss risks Vs benefits of continuing AC or doing a prophylactic dose RTC in 3 weeks with labs

## 2022-11-29 NOTE — Assessment & Plan Note (Signed)
Patient reports frequent falls due to balance loss Normal vitamin B12 recently Was working with physical therapy before but is not doing it anymore Denies dizziness or loss of consciousness For management of primary care

## 2022-11-29 NOTE — Progress Notes (Signed)
[facility] at [facility] HEMATOLOGY NEW VISIT  Alexis Harris, Alexis Salinas, MD  REASON FOR REFERRAL: DVT  SUMMARY OF HEMATOLOGIC HISTORY: 09/05/22: Left leg DVT Risk factors: B/L cellulitis, smoking Repeat US: 11/16/22: Showed resolution of DVT   HISTORY OF PRESENT ILLNESS: Alexis Harris 52 y.o. female referred for DVT.  She is accompanied by a friend today who she also considers a sister.  She has no complaints today.  She has a past medical history of anxiety, depression, colon polyps, cellulitis. Patient was recently admitted to the hospital in June 2024 for for bilateral cellulitis and a left leg DVT was discovered at that time.  She was started on Eliquis and was discharged on the same.  Cellulitis is resolved at this time patient reports frequent cellulitis.  She was asked to see a vascular surgeon for her DVT who repeated an ultrasound which showed resolution of DVT for thrombophilia and referred for the same.  Patient stated that she is compliant with her Eliquis and has not problems taking it.  She reports no acute bleeding, abdominal pain, melena.  She reported that she falls often, due to balance issues and usually hold onto stuff so that she does not fall.  She has never lost consciousness or had dizziness.  She spends most of her time in a couch or in her bed and is only active when she feeds her cats and does her daily chores.  Patient does not know her family history because she was adopted.  She has a 20-pack-year smoking history.  She is nonalcoholic.  I have reviewed the past medical history, past surgical history, social history and family history with the patient   ALLERGIES:  is allergic to advair diskus [fluticasone-salmeterol], other, codeine, and penicillins.  MEDICATIONS:  Current Outpatient Medications  Medication Sig Dispense Refill   albuterol (VENTOLIN HFA) 108 (90 Base) MCG/ACT inhaler Inhale 2 puffs into the lungs every 6 (six) hours as  needed for wheezing or shortness of breath.     apixaban (ELIQUIS) 5 MG TABS tablet Take by mouth.     ARIPiprazole (ABILIFY) 10 MG tablet Take 10 mg by mouth in the morning.     Ascorbic Acid (VITAMIN C) 1000 MG tablet Take 1,000 mg by mouth in the morning.     atorvastatin (LIPITOR) 20 MG tablet Take 20 mg by mouth at bedtime.     buprenorphine (BUTRANS) 10 MCG/HR PTWK Place 1 patch onto the skin every Sunday.     buPROPion (WELLBUTRIN SR) 150 MG 12 hr tablet Take 150 mg by mouth 2 (two) times daily.     busPIRone (BUSPAR) 15 MG tablet Take 15 mg by mouth 3 (three) times daily.     cetirizine (ZYRTEC) 10 MG tablet Take 10 mg by mouth in the morning.     CINNAMON PO Take 2 tablets by mouth in the morning.     cyclobenzaprine (FLEXERIL) 10 MG tablet Take 10 mg by mouth 3 (three) times daily as needed for muscle spasms.     diphenoxylate-atropine (LOMOTIL) 2.5-0.025 MG tablet Take 1 tablet by mouth 1-2 times daily to control diarrhea. 60 tablet 5   estradiol (ESTRACE) 0.1 MG/GM vaginal cream Place 1 g vaginally 3 (three) times a week.     famotidine (PEPCID) 20 MG tablet TAKE 1 TABLET BY MOUTH EVERYDAY AT BEDTIME 90 tablet 2   fluticasone furoate-vilanterol (BREO ELLIPTA) 100-25 MCG/ACT AEPB Inhale 1 puff into the lungs daily.  fluvoxaMINE (LUVOX) 100 MG tablet Take 150 mg by mouth 2 (two) times daily.     furosemide (LASIX) 20 MG tablet Take 20 mg by mouth in the morning and at bedtime.     gabapentin (NEURONTIN) 300 MG capsule Take 300 mg by mouth 3 (three) times daily.     guaiFENesin (MUCINEX) 600 MG 12 hr tablet Take 600 mg by mouth daily as needed.     hydrOXYzine (ATARAX) 50 MG tablet Take 50-100 mg by mouth at bedtime as needed (sleep).     lamoTRIgine (LAMICTAL) 200 MG tablet Take 200 mg by mouth every evening.     loperamide (IMODIUM) 2 MG capsule Take 1 capsule (2 mg total) by mouth 2 (two) times daily before a meal. 60 capsule 3   metFORMIN (GLUCOPHAGE) 500 MG tablet Take 500 mg  by mouth 2 (two) times daily.     montelukast (SINGULAIR) 10 MG tablet Take 10 mg by mouth at bedtime.     Multiple Vitamin (MULTIVITAMIN WITH MINERALS) TABS tablet Take 1 tablet by mouth in the morning.     MYRBETRIQ 50 MG TB24 tablet Take 50 mg by mouth in the morning.     OMEGA 3 1200 MG CAPS Take 1,200 mg by mouth in the morning.     oxymetazoline (AFRIN) 0.05 % nasal spray Place 1 spray into both nostrils 2 (two) times daily as needed for congestion.     pantoprazole (PROTONIX) 40 MG tablet TAKE 1 TABLET (40 MG TOTAL) BY MOUTH TWICE A DAY BEFORE MEALS 180 tablet 2   Phenylephrine-Acetaminophen (SINUS + HEADACHE PO) Take 2 tablets by mouth daily as needed (sinus headaches).     SUMAtriptan (IMITREX) 100 MG tablet Take 100 mg by mouth every 2 (two) hours as needed for migraine. *TAKE ONE TABLET BY MOUTH AT ONSET OF MIGRAINE. IF MIGRAINE PERSISTS AFTER 2 HOURS, TAKE ONE ADDITIONAL TABLET. *DO NOT EXCEED MORE THAN 2 TABLETS IN A 24 HOUR PERIOD AND DO NOT USE NO MORE THAN 5 TABLETS WITHIN THE PERIOD OF 1 WEEK*     topiramate (TOPAMAX) 50 MG tablet Take 150 mg by mouth in the morning.     traZODone (DESYREL) 50 MG tablet Take 50-150 mg by mouth at bedtime.     UNABLE TO FIND Apply topically as needed. Med Name: **HOME TENS UNIT: FOR LOWER CHRONIC BACK PAIN/BACK PROBLEMS      valACYclovir (VALTREX) 500 MG tablet Take 500 mg by mouth 2 (two) times daily as needed (outbreaks).     No current facility-administered medications for this visit.     REVIEW OF SYSTEMS:   Constitutional: Denies fevers, chills or night sweats Eyes: Denies blurriness of vision Ears, nose, mouth, throat, and face: Denies mucositis or sore throat Respiratory: Denies cough, dyspnea or wheezes Cardiovascular: Denies palpitation, chest discomfort Gastrointestinal:  Denies nausea, heartburn or change in bowel habits Skin: Denies abnormal skin rashes Lymphatics: Denies new lymphadenopathy or easy bruising Neurological:Denies  numbness, tingling or new weaknesses Behavioral/Psych: Mood is stable, no new changes  All other systems were reviewed with the patient and are negative.  PHYSICAL EXAMINATION:   Vitals:   11/29/22 1428  BP: 117/74  Pulse: 92  Resp: 18  Temp: 98.2 F (36.8 C)  SpO2: 96%    GENERAL:alert, no distress and comfortable SKIN: skin color, texture, turgor are normal, no rashes or significant lesions Musculoskeletal:no cyanosis of digits and no clubbing, no swelling NEURO: alert & oriented x 3 with fluent speech  LABORATORY  DATA:  I have reviewed the data as listed and also from care every where  Lab Results  Component Value Date   WBC 10.5 04/19/2022   NEUTROABS 4.9 04/12/2022   HGB 12.0 04/19/2022   HCT 35.4 (L) 04/19/2022   MCV 91.0 04/19/2022   PLT 422 (H) 04/19/2022      Component Value Date/Time   NA 131 (L) 04/21/2022 0313   K 4.0 04/21/2022 0313   CL 99 04/21/2022 0313   CO2 20 (L) 04/21/2022 0313   GLUCOSE 85 04/21/2022 0313   BUN 9 04/21/2022 0313   CREATININE 0.65 04/21/2022 0313   CALCIUM 8.6 (L) 04/21/2022 0313   PROT 7.2 04/16/2022 0312   ALBUMIN 3.4 (L) 04/16/2022 0312   AST 15 04/16/2022 0312   ALT 21 04/16/2022 0312   ALKPHOS 75 04/16/2022 0312   BILITOT 0.8 04/16/2022 0312   GFRNONAA >60 04/21/2022 0313   GFRAA >60 02/19/2015 1736       Chemistry      Component Value Date/Time   NA 131 (L) 04/21/2022 0313   K 4.0 04/21/2022 0313   CL 99 04/21/2022 0313   CO2 20 (L) 04/21/2022 0313   BUN 9 04/21/2022 0313   CREATININE 0.65 04/21/2022 0313      Component Value Date/Time   CALCIUM 8.6 (L) 04/21/2022 0313   ALKPHOS 75 04/16/2022 0312   AST 15 04/16/2022 0312   ALT 21 04/16/2022 0312   BILITOT 0.8 04/16/2022 0312     CBC, Iron panel, Ferritin, Vitamin B12: WNL (09/2022)  RADIOGRAPHIC STUDIES: Venous US 11/16/22: No evidence of thrombus  ASSESSMENT & PLAN:  [name] a 52 year old female referred for DVT.  We discussed in detail  the diagnosis of DVT and probably that it is provoked and would require anticoagulation for at least 3 months.  We also discussed risk versus benefits in detail, with patient's risk of falling down risks may outweigh benefits of anticoagulation and might have to limit the 6-month mark.  I do not see a necessity of thrombophilia testing in this patient especially when she is not a candidate for lifelong anticoagulation.  She reported that she has no kids,so determining familial thrombophilia is not going to change treatment in anyway in this particular patient.  DVT (deep venous thrombosis) (HCC) Likely provoked with multiple risk [cellulitis, smoking, diabetes] patient Patient also reports falling frequently increasing the risk of bleeding Risks versus benefits of anticoagulation [facility]) discussed in detail with the patient Patient will need 3 months of anticoagulation in the setting of provoked clot But considering patient is a current smoker, can repeat a d-dimer at the 3 month mark If D-dimer is normal, with the recent US showing no clot, can DC AC at that time If D-dimer is elevated, can discuss risks Vs benefits of continuing AC or doing a prophylactic dose RTC in 3 weeks with labs   Tobacco use Patient has a 20-year pack history Was smoking 1/2 pack/day for several years and recently cut down a little  Discussed smoking cessation in detail and offered to provide alternative support Patient said she is working on it and would ask [place] if she needs any more support  Adenomatous polyps Patient had a colectomy in January 24 for multiple polyps Path report showed at least 30 polyps no carcinoma Has a follow-up with GI in December  Falls frequently Patient reports frequent falls due to balance loss Normal vitamin B12 recently Was working with physical therapy before but is not  doing it anymore Denies dizziness or loss of consciousness For management of primary care   No orders of the  defined types were placed in this encounter.   The total time spent in the appointment was 55 minutes encounter with patients including review of chart and various tests results, discussions about plan of care and coordination of care plan and documentation   All questions were answered. The patient knows to call the clinic with any problems, questions or concerns. No barriers to learning was detected.   Cindie Crumbly, MD 8/28/20243:10 PM

## 2022-11-29 NOTE — Assessment & Plan Note (Addendum)
Patient had a colectomy in January 24 for multiple polyps Path report showed at least 30 polyps no carcinoma Has a follow-up with GI in December

## 2022-12-05 ENCOUNTER — Encounter: Payer: Medicare PPO | Admitting: Vascular Surgery

## 2022-12-06 DIAGNOSIS — J449 Chronic obstructive pulmonary disease, unspecified: Secondary | ICD-10-CM | POA: Diagnosis not present

## 2022-12-06 DIAGNOSIS — F339 Major depressive disorder, recurrent, unspecified: Secondary | ICD-10-CM | POA: Diagnosis not present

## 2022-12-06 DIAGNOSIS — Z23 Encounter for immunization: Secondary | ICD-10-CM | POA: Diagnosis not present

## 2022-12-06 DIAGNOSIS — E1165 Type 2 diabetes mellitus with hyperglycemia: Secondary | ICD-10-CM | POA: Diagnosis not present

## 2022-12-14 DIAGNOSIS — N3941 Urge incontinence: Secondary | ICD-10-CM | POA: Diagnosis not present

## 2022-12-18 ENCOUNTER — Inpatient Hospital Stay: Payer: Medicare PPO | Attending: Oncology

## 2022-12-18 DIAGNOSIS — Z79899 Other long term (current) drug therapy: Secondary | ICD-10-CM | POA: Insufficient documentation

## 2022-12-18 DIAGNOSIS — Z7901 Long term (current) use of anticoagulants: Secondary | ICD-10-CM | POA: Diagnosis not present

## 2022-12-18 DIAGNOSIS — I82462 Acute embolism and thrombosis of left calf muscular vein: Secondary | ICD-10-CM

## 2022-12-18 DIAGNOSIS — R296 Repeated falls: Secondary | ICD-10-CM | POA: Insufficient documentation

## 2022-12-18 DIAGNOSIS — Z86718 Personal history of other venous thrombosis and embolism: Secondary | ICD-10-CM | POA: Insufficient documentation

## 2022-12-18 DIAGNOSIS — F17210 Nicotine dependence, cigarettes, uncomplicated: Secondary | ICD-10-CM | POA: Insufficient documentation

## 2022-12-18 LAB — CBC WITH DIFFERENTIAL/PLATELET
Abs Immature Granulocytes: 0.03 10*3/uL (ref 0.00–0.07)
Basophils Absolute: 0 10*3/uL (ref 0.0–0.1)
Basophils Relative: 1 %
Eosinophils Absolute: 0.4 10*3/uL (ref 0.0–0.5)
Eosinophils Relative: 4 %
HCT: 40.6 % (ref 36.0–46.0)
Hemoglobin: 13.7 g/dL (ref 12.0–15.0)
Immature Granulocytes: 0 %
Lymphocytes Relative: 21 %
Lymphs Abs: 1.7 10*3/uL (ref 0.7–4.0)
MCH: 30.3 pg (ref 26.0–34.0)
MCHC: 33.7 g/dL (ref 30.0–36.0)
MCV: 89.8 fL (ref 80.0–100.0)
Monocytes Absolute: 0.9 10*3/uL (ref 0.1–1.0)
Monocytes Relative: 11 %
Neutro Abs: 5.2 10*3/uL (ref 1.7–7.7)
Neutrophils Relative %: 63 %
Platelets: 267 10*3/uL (ref 150–400)
RBC: 4.52 MIL/uL (ref 3.87–5.11)
RDW: 14.4 % (ref 11.5–15.5)
WBC: 8.2 10*3/uL (ref 4.0–10.5)
nRBC: 0 % (ref 0.0–0.2)

## 2022-12-18 LAB — D-DIMER, QUANTITATIVE: D-Dimer, Quant: 0.6 ug{FEU}/mL — ABNORMAL HIGH (ref 0.00–0.50)

## 2022-12-20 ENCOUNTER — Other Ambulatory Visit (HOSPITAL_COMMUNITY): Payer: Self-pay | Admitting: Gerontology

## 2022-12-20 DIAGNOSIS — N63 Unspecified lump in unspecified breast: Secondary | ICD-10-CM

## 2022-12-21 ENCOUNTER — Encounter: Payer: Self-pay | Admitting: Oncology

## 2022-12-21 ENCOUNTER — Inpatient Hospital Stay: Payer: Medicare PPO | Admitting: Oncology

## 2022-12-21 VITALS — BP 118/83 | HR 84 | Temp 98.2°F | Resp 20 | Ht 64.0 in | Wt 213.3 lb

## 2022-12-21 DIAGNOSIS — L03116 Cellulitis of left lower limb: Secondary | ICD-10-CM

## 2022-12-21 DIAGNOSIS — R296 Repeated falls: Secondary | ICD-10-CM | POA: Diagnosis not present

## 2022-12-21 DIAGNOSIS — Z86718 Personal history of other venous thrombosis and embolism: Secondary | ICD-10-CM | POA: Diagnosis not present

## 2022-12-21 DIAGNOSIS — Z720 Tobacco use: Secondary | ICD-10-CM | POA: Diagnosis not present

## 2022-12-21 DIAGNOSIS — F17210 Nicotine dependence, cigarettes, uncomplicated: Secondary | ICD-10-CM | POA: Diagnosis not present

## 2022-12-21 DIAGNOSIS — I82462 Acute embolism and thrombosis of left calf muscular vein: Secondary | ICD-10-CM | POA: Diagnosis not present

## 2022-12-21 DIAGNOSIS — Z79899 Other long term (current) drug therapy: Secondary | ICD-10-CM | POA: Diagnosis not present

## 2022-12-21 DIAGNOSIS — E1165 Type 2 diabetes mellitus with hyperglycemia: Secondary | ICD-10-CM | POA: Diagnosis not present

## 2022-12-21 DIAGNOSIS — D369 Benign neoplasm, unspecified site: Secondary | ICD-10-CM

## 2022-12-21 DIAGNOSIS — J449 Chronic obstructive pulmonary disease, unspecified: Secondary | ICD-10-CM | POA: Diagnosis not present

## 2022-12-21 DIAGNOSIS — Z7901 Long term (current) use of anticoagulants: Secondary | ICD-10-CM | POA: Diagnosis not present

## 2022-12-21 NOTE — Patient Instructions (Signed)
Please discontinue Eliquis Smoking cessation Return to clinic in 3 months

## 2022-12-21 NOTE — Progress Notes (Signed)
[facility] at [facility] HEMATOLOGY NEW VISIT  Alexis Harris, Alexis Salinas, MD  REASON FOR REFERRAL: DVT  SUMMARY OF HEMATOLOGIC HISTORY: 09/05/22: Left leg DVT Risk factors: B/L cellulitis, smoking Repeat US: 11/16/22: Showed resolution of DVT Treatment: Eliquis started 09/2022   INTERVAL HISTORY: Alexis Harris is here for follow-up.  She is compliant with Eliquis.  She reported some left leg pain and erythema.  She has no other complaints.  Denies fever, chills, nausea, vomiting, abdominal pain.  Patient reported that she is not falling anymore.  We discussed the D-dimer result which is 0.60, that is slightly higher than the normal level.  Discussed that since she completed 3 months of treatment for provoked clot we can discontinue Eliquis at this time.  She is also at high risk of recurrence because of history of smoking so also offered the option to do Eliquis prophylactic dose.  Patient preferred to discontinue Eliquis at this time.  I explained in detail signs to look out for for PE and DVT including bilateral or unilateral leg pain, shortness of breath, chest pain.  Risk versus benefits discussed in detail and all the questions and concerns were answered.   I have reviewed the past medical history, past surgical history, social history and family history with the patient   ALLERGIES:  is allergic to advair diskus [fluticasone-salmeterol], other, codeine, and penicillins.  MEDICATIONS:  Current Outpatient Medications  Medication Sig Dispense Refill   albuterol (VENTOLIN HFA) 108 (90 Base) MCG/ACT inhaler Inhale 2 puffs into the lungs every 6 (six) hours as needed for wheezing or shortness of breath.     apixaban (ELIQUIS) 5 MG TABS tablet Take by mouth.     ARIPiprazole (ABILIFY) 10 MG tablet Take 10 mg by mouth in the morning.     Ascorbic Acid (VITAMIN C) 1000 MG tablet Take 1,000 mg by mouth in the morning.     atorvastatin (LIPITOR) 20 MG tablet Take 20 mg  by mouth at bedtime.     buprenorphine (BUTRANS) 10 MCG/HR PTWK Place 1 patch onto the skin every Sunday.     buPROPion (WELLBUTRIN SR) 150 MG 12 hr tablet Take 150 mg by mouth 2 (two) times daily.     busPIRone (BUSPAR) 15 MG tablet Take 15 mg by mouth 3 (three) times daily.     cetirizine (ZYRTEC) 10 MG tablet Take 10 mg by mouth in the morning.     CINNAMON PO Take 2 tablets by mouth in the morning.     cyclobenzaprine (FLEXERIL) 10 MG tablet Take 10 mg by mouth 3 (three) times daily as needed for muscle spasms.     diphenoxylate-atropine (LOMOTIL) 2.5-0.025 MG tablet Take 1 tablet by mouth 1-2 times daily to control diarrhea. 60 tablet 5   estradiol (ESTRACE) 0.1 MG/GM vaginal cream Place 1 g vaginally 3 (three) times a week.     famotidine (PEPCID) 20 MG tablet TAKE 1 TABLET BY MOUTH EVERYDAY AT BEDTIME 90 tablet 2   fluticasone furoate-vilanterol (BREO ELLIPTA) 100-25 MCG/ACT AEPB Inhale 1 puff into the lungs daily.     fluvoxaMINE (LUVOX) 100 MG tablet Take 150 mg by mouth 2 (two) times daily.     furosemide (LASIX) 20 MG tablet Take 20 mg by mouth in the morning and at bedtime.     gabapentin (NEURONTIN) 300 MG capsule Take 300 mg by mouth 3 (three) times daily.     guaiFENesin (MUCINEX) 600 MG 12 hr tablet Take 600 mg by mouth  daily as needed.     hydrOXYzine (ATARAX) 50 MG tablet Take 50-100 mg by mouth at bedtime as needed (sleep).     lamoTRIgine (LAMICTAL) 200 MG tablet Take 200 mg by mouth every evening.     loperamide (IMODIUM) 2 MG capsule Take 1 capsule (2 mg total) by mouth 2 (two) times daily before a meal. 60 capsule 3   metFORMIN (GLUCOPHAGE) 500 MG tablet Take 500 mg by mouth 2 (two) times daily.     montelukast (SINGULAIR) 10 MG tablet Take 10 mg by mouth at bedtime.     Multiple Vitamin (MULTIVITAMIN WITH MINERALS) TABS tablet Take 1 tablet by mouth in the morning.     MYRBETRIQ 50 MG TB24 tablet Take 50 mg by mouth in the morning.     OMEGA 3 1200 MG CAPS Take 1,200 mg  by mouth in the morning.     oxymetazoline (AFRIN) 0.05 % nasal spray Place 1 spray into both nostrils 2 (two) times daily as needed for congestion.     pantoprazole (PROTONIX) 40 MG tablet TAKE 1 TABLET (40 MG TOTAL) BY MOUTH TWICE A DAY BEFORE MEALS 180 tablet 2   Phenylephrine-Acetaminophen (SINUS + HEADACHE PO) Take 2 tablets by mouth daily as needed (sinus headaches).     SUMAtriptan (IMITREX) 100 MG tablet Take 100 mg by mouth every 2 (two) hours as needed for migraine. *TAKE ONE TABLET BY MOUTH AT ONSET OF MIGRAINE. IF MIGRAINE PERSISTS AFTER 2 HOURS, TAKE ONE ADDITIONAL TABLET. *DO NOT EXCEED MORE THAN 2 TABLETS IN A 24 HOUR PERIOD AND DO NOT USE NO MORE THAN 5 TABLETS WITHIN THE PERIOD OF 1 WEEK*     topiramate (TOPAMAX) 50 MG tablet Take 150 mg by mouth in the morning.     traZODone (DESYREL) 50 MG tablet Take 50-150 mg by mouth at bedtime.     UNABLE TO FIND Apply topically as needed. Med Name: **HOME TENS UNIT: FOR LOWER CHRONIC BACK PAIN/BACK PROBLEMS      valACYclovir (VALTREX) 500 MG tablet Take 500 mg by mouth 2 (two) times daily as needed (outbreaks).     No current facility-administered medications for this visit.     REVIEW OF SYSTEMS:   Constitutional: Denies fevers, chills or night sweats Eyes: Denies blurriness of vision Ears, nose, mouth, throat, and face: Denies mucositis or sore throat Respiratory: Denies cough, dyspnea or wheezes Cardiovascular: Denies palpitation, chest discomfort or lower extremity swelling Gastrointestinal:  Denies nausea, heartburn or change in bowel habits Skin: Denies abnormal skin rashes Lymphatics: Denies new lymphadenopathy or easy bruising Neurological:Denies numbness, tingling or new weaknesses Behavioral/Psych: Mood is stable, no new changes  All other systems were reviewed with the patient and are negative.  PHYSICAL EXAMINATION:   Vitals:   12/21/22 0908  BP: 118/83  Pulse: 84  Resp: 20  Temp: 98.2 F (36.8 C)  SpO2: 97%     GENERAL:alert, no distress and comfortable LUNGS: clear to auscultation and percussion with normal breathing effort HEART: regular rate & rhythm and no murmurs and no lower extremity edema Musculoskeletal:no cyanosis of digits and no clubbing, there is erythema around the left ankle, nontender, warm, pitting edema extending up to the knee.  Right leg examination normal. NEURO: alert & oriented x 3 with fluent speech, no focal motor/sensory deficits  LABORATORY DATA:  I have reviewed the data as listed  Lab Results  Component Value Date   WBC 8.2 12/18/2022   NEUTROABS 5.2 12/18/2022   HGB 13.7  12/18/2022   HCT 40.6 12/18/2022   MCV 89.8 12/18/2022   PLT 267 12/18/2022      Component Value Date/Time   NA 131 (L) 04/21/2022 0313   K 4.0 04/21/2022 0313   CL 99 04/21/2022 0313   CO2 20 (L) 04/21/2022 0313   GLUCOSE 85 04/21/2022 0313   BUN 9 04/21/2022 0313   CREATININE 0.65 04/21/2022 0313   CALCIUM 8.6 (L) 04/21/2022 0313   PROT 7.2 04/16/2022 0312   ALBUMIN 3.4 (L) 04/16/2022 0312   AST 15 04/16/2022 0312   ALT 21 04/16/2022 0312   ALKPHOS 75 04/16/2022 0312   BILITOT 0.8 04/16/2022 0312   GFRNONAA >60 04/21/2022 0313   GFRAA >60 02/19/2015 1736    Latest Reference Range & Units 12/18/22 14:59  D-Dimer, Quant 0.00 - 0.50 ug/mL-FEU 0.60 (H)  (H): Data is abnormally high    ASSESSMENT & PLAN:  [name] a 52 year old female referred for DVT.   DVT (deep venous thrombosis) (HCC) -Likely provoked with multiple risk factors [cellulitis, smoking, diabetes]  -Patient also reports falling frequently increasing the risk of bleeding.  Did not fall since her last visit -Patient completed 3 months of anticoagulation with Eliquis with recent scan of the left leg showing resolution of the clot -D-dimer is 0.60, slightly higher than normal but can also be associated with history of smoking -Offered patient choice of discontinuing Eliquis versus doing a prophylactic dose,  patient wanted to discontinue it at this time.  Treatment of provoked clot is only 3 months but since patient has risk factor of smoking offered to do a prophylactic dose of Eliquis. -As the patient has multiple risk factors, no children that would benefit from thrombophilia testing, I do not see benefit in thrombophilia testing as it would not change the management of this patient. -Will repeat D-dimer in 3 months, if it is stable or lower we will discharge the patient from the clinic to follow-up with primary care  RTC in 3 months with labs   Tobacco use -Patient has a 20-year pack history -Was smoking 1/2 pack/day for several years and recently cut down a little.  Patient reported cutting down smoking this last visit. -Discussed smoking cessation in detail and offered to provide alternative support -Patient said she is working on it and would ask [place] if she needs any more support  Adenomatous polyps -Patient had a colectomy in January 24 for multiple polyps -Path report showed at least 30 polyps no carcinoma -Has a follow-up with GI in December  Falls frequently -Patient reports frequent falls due to balance loss.  Did not fall since the last visit. -Normal vitamin B12 recently -Was working with physical therapy before but is not doing it anymore -Denies dizziness or loss of consciousness -Defer management to primary care  Cellulitis of left leg -Patient has erythema of left ankle, suspicious for cellulitis especially with a history of prior cellulitis -Patient received antibiotics before but does not remember which ones -She stated that she can get in touch with primary care today and request for antibiotics -Discussed to look out for fever, chills and reach out to primary care as soon as possible -Defer management to primary care at this time   No orders of the defined types were placed in this encounter.   The total time spent in the appointment was 20 minutes encounter with  patients including review of chart and various tests results, discussions about plan of care and coordination of care plan  All questions were answered. The patient knows to call the clinic with any problems, questions or concerns. No barriers to learning was detected.   Cindie Crumbly, MD 9/19/20249:46 AM

## 2022-12-21 NOTE — Assessment & Plan Note (Addendum)
Patient had a colectomy in January 24 for multiple polyps Path report showed at least 30 polyps no carcinoma Has a follow-up with GI in December

## 2022-12-21 NOTE — Assessment & Plan Note (Addendum)
-  Likely provoked with multiple risk factors [cellulitis, smoking, diabetes]  -Patient also reports falling frequently increasing the risk of bleeding.  Did not fall since her last visit -Patient completed 3 months of anticoagulation with Eliquis with recent scan of the left leg showing resolution of the clot -D-dimer is 0.60, slightly higher than normal but can also be associated with history of smoking -Offered patient choice of discontinuing Eliquis versus doing a prophylactic dose, patient wanted to discontinue it at this time.  Treatment of provoked clot is only 3 months but since patient has risk factor of smoking offered to do a prophylactic dose of Eliquis. -As the patient has multiple risk factors, no children that would benefit from thrombophilia testing, I do not see benefit in thrombophilia testing as it would not change the management of this patient. -Will repeat D-dimer in 3 months, if it is stable or lower we will discharge the patient from the clinic to follow-up with primary care  RTC in 3 months with labs

## 2022-12-21 NOTE — Assessment & Plan Note (Addendum)
-  Patient reports frequent falls due to balance loss.  Did not fall since the last visit. -Normal vitamin B12 recently -Was working with physical therapy before but is not doing it anymore -Denies dizziness or loss of consciousness -Defer management to primary care

## 2022-12-21 NOTE — Assessment & Plan Note (Addendum)
-  Patient has a 20-year pack history -Was smoking 1/2 pack/day for several years and recently cut down a little.  Patient reported cutting down smoking this last visit. -Discussed smoking cessation in detail and offered to provide alternative support -Patient said she is working on it and would ask [place] if she needs any more support

## 2022-12-21 NOTE — Assessment & Plan Note (Signed)
-  Patient has erythema of left ankle, suspicious for cellulitis especially with a history of prior cellulitis -Patient received antibiotics before but does not remember which ones -She stated that she can get in touch with primary care today and request for antibiotics -Discussed to look out for fever, chills and reach out to primary care as soon as possible -Defer management to primary care at this time

## 2023-01-02 DIAGNOSIS — F331 Major depressive disorder, recurrent, moderate: Secondary | ICD-10-CM | POA: Diagnosis not present

## 2023-01-02 DIAGNOSIS — F429 Obsessive-compulsive disorder, unspecified: Secondary | ICD-10-CM | POA: Diagnosis not present

## 2023-01-02 DIAGNOSIS — F411 Generalized anxiety disorder: Secondary | ICD-10-CM | POA: Diagnosis not present

## 2023-01-02 DIAGNOSIS — F17210 Nicotine dependence, cigarettes, uncomplicated: Secondary | ICD-10-CM | POA: Diagnosis not present

## 2023-01-11 DIAGNOSIS — N3941 Urge incontinence: Secondary | ICD-10-CM | POA: Diagnosis not present

## 2023-01-11 DIAGNOSIS — N3945 Continuous leakage: Secondary | ICD-10-CM | POA: Diagnosis not present

## 2023-01-11 DIAGNOSIS — R350 Frequency of micturition: Secondary | ICD-10-CM | POA: Diagnosis not present

## 2023-01-11 DIAGNOSIS — R82998 Other abnormal findings in urine: Secondary | ICD-10-CM | POA: Diagnosis not present

## 2023-01-18 IMAGING — MG DIGITAL DIAGNOSTIC BILAT W/ TOMO W/ CAD
8 series · 8 of 24 positions shown · non-contrast
Comparison: Previous exams.

CLINICAL DATA: Short-term follow-up for probably benign mass in the
lower outer quadrant of the left breast.

EXAM:
DIGITAL DIAGNOSTIC BILATERAL MAMMOGRAM WITH TOMOSYNTHESIS AND CAD
TECHNIQUE: Bilateral digital diagnostic mammography and breast tomosynthesis
was performed. The images were evaluated with computer-aided
detection.

[L CC synth-2D]
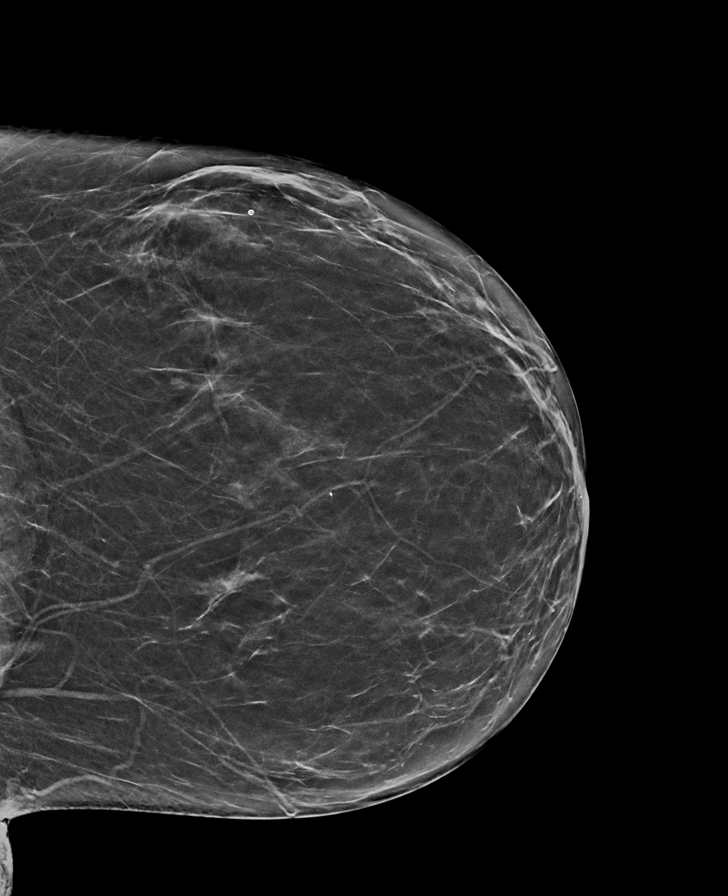

[R MLO synth-2D]
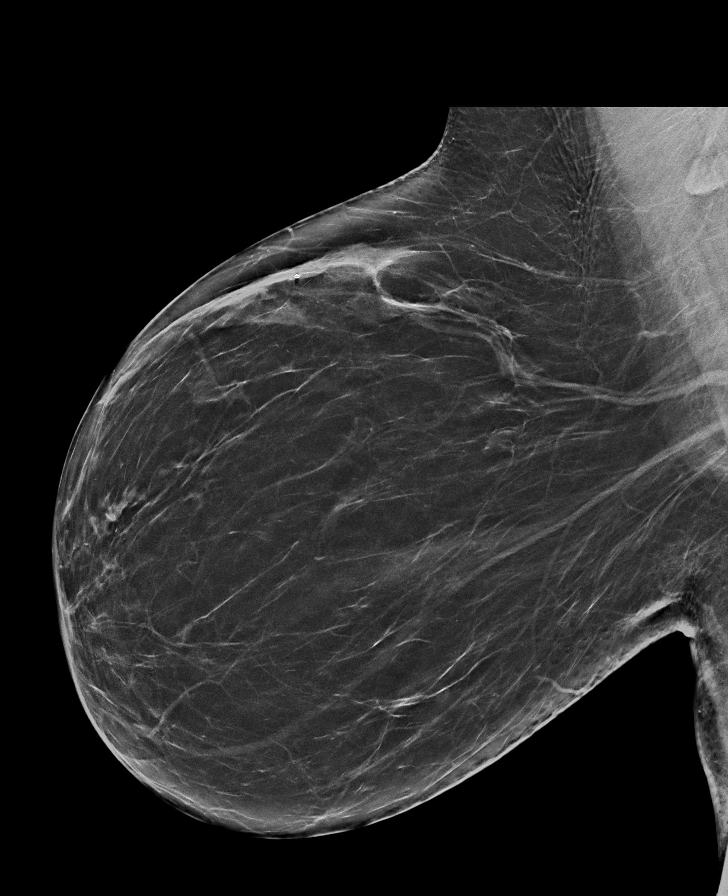

[R CC synth-2D]
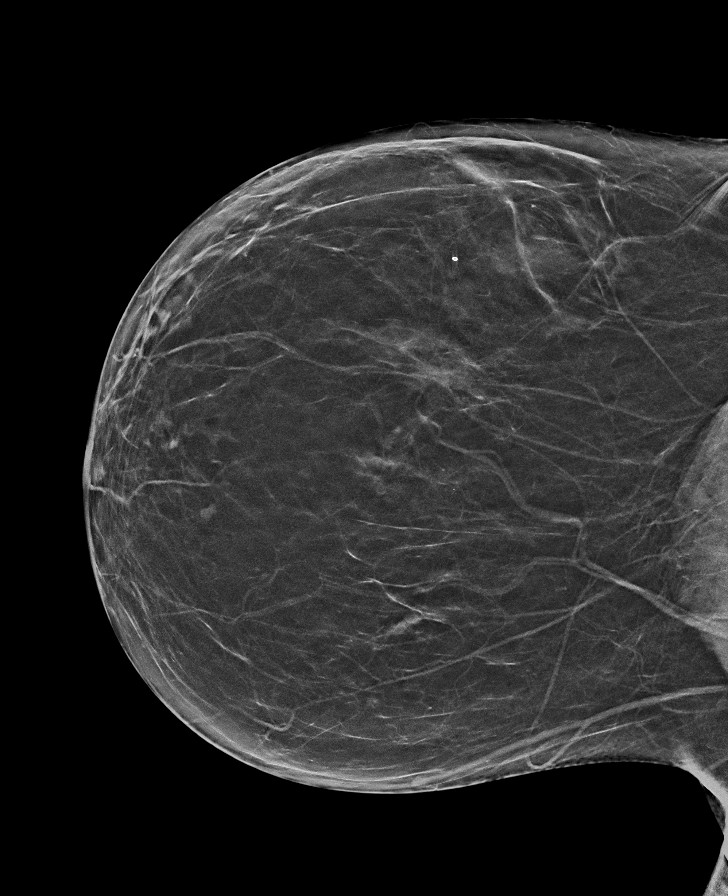

[L MLO synth-2D]
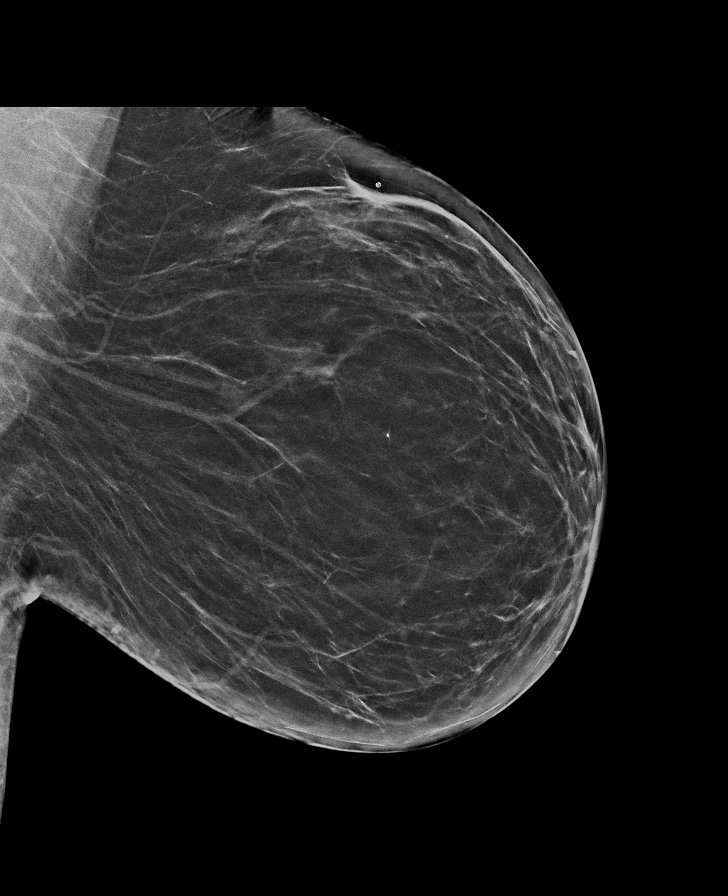

[L CC tomo · tomo slice 30/59.0]
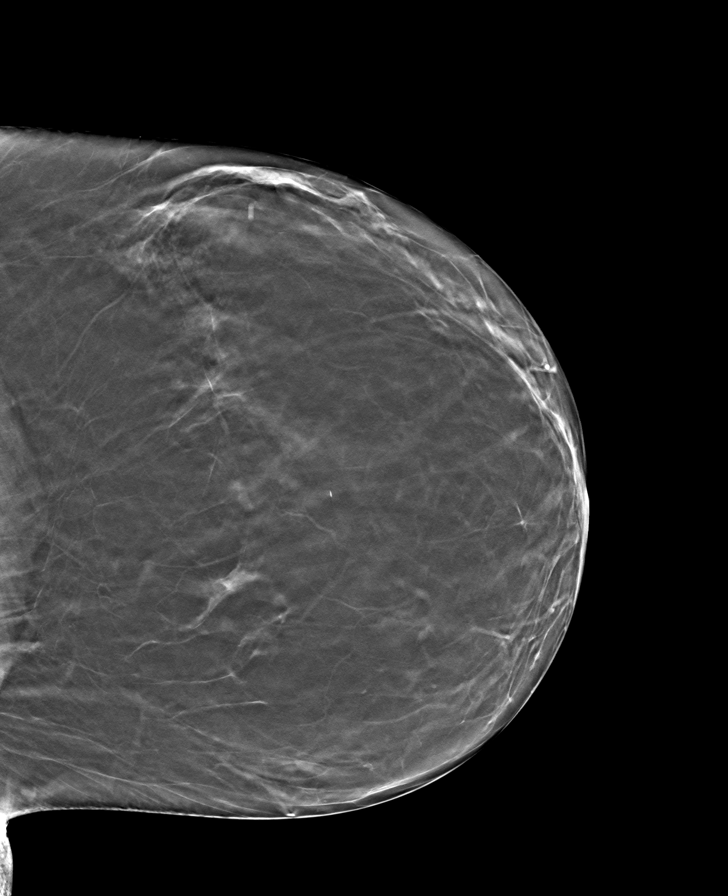

[R CC tomo · tomo slice 30/59.0]
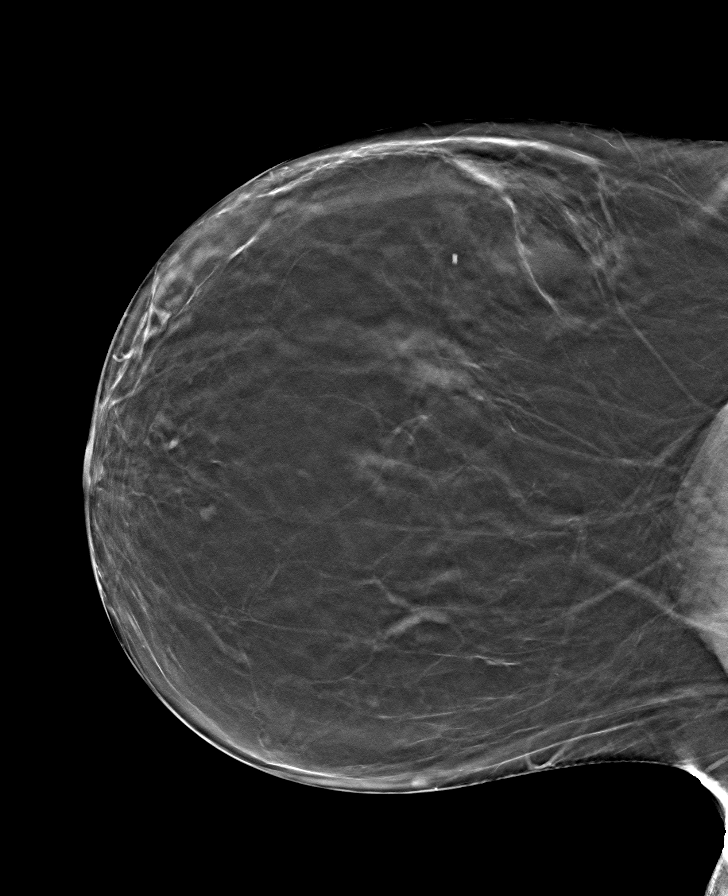

[L MLO tomo · tomo slice 34/67.0]
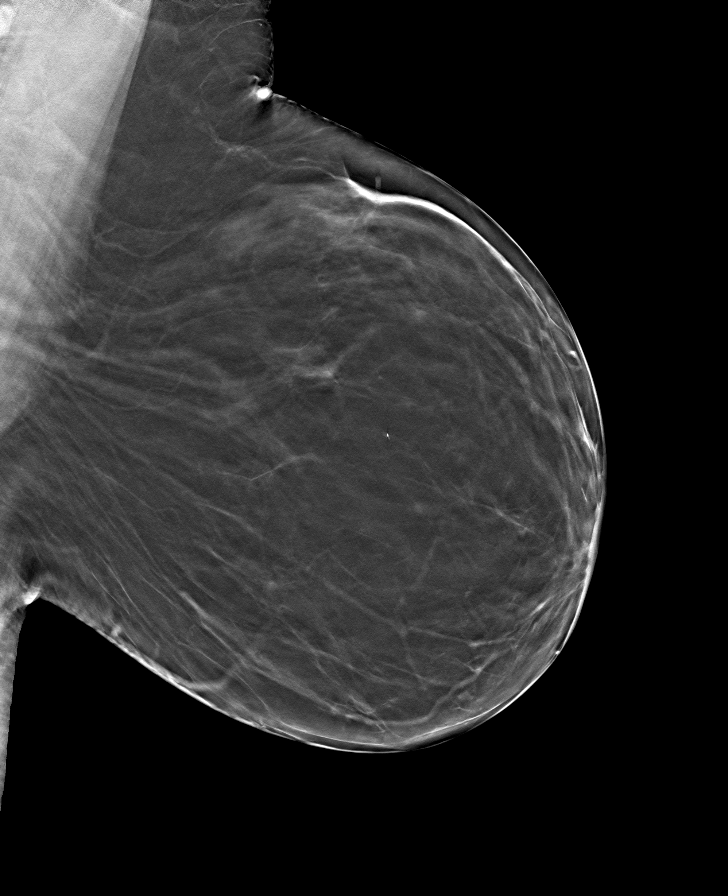

[R MLO tomo · tomo slice 35/68.0]
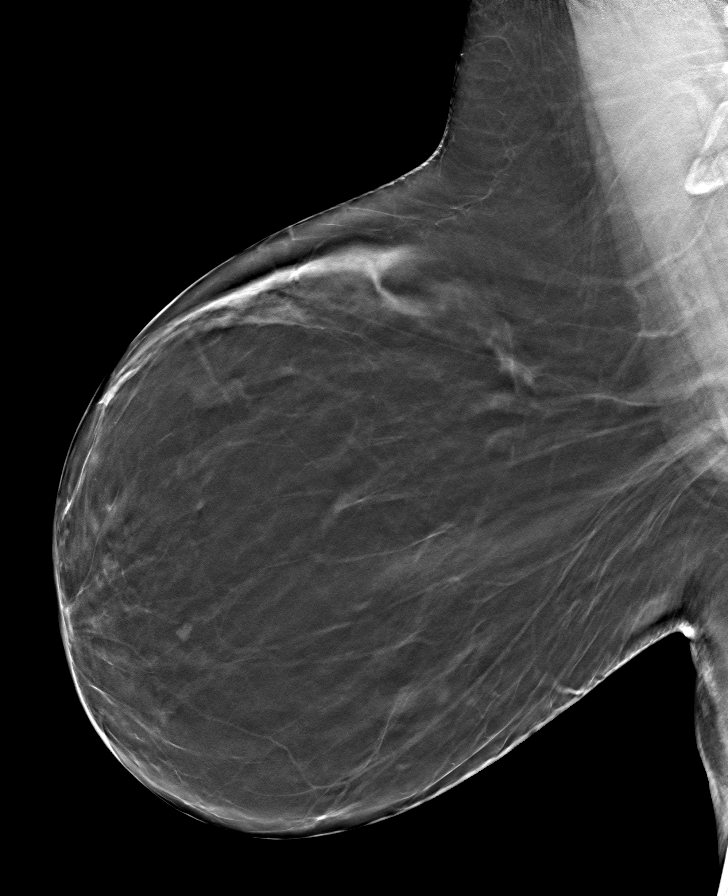

[8 of 24 positions shown; findings below may reference images not displayed]

ACR Breast Density Category b: There are scattered areas of
fibroglandular density.
FINDINGS: No suspicious masses or calcifications are seen in either breast.
The oval circumscribed mass in the lower outer left breast measuring
0.5 cm appears unchanged. This is considered benign given 2 years of
stability. There is no mammographic evidence of malignancy in either
breast.
IMPRESSION: No mammographic evidence of malignancy in either breast.

RECOMMENDATION:
Screening mammogram in one year.(Code:[id number])

I have discussed the findings and recommendations with the patient.
If applicable, a reminder letter will be sent to the patient
regarding the next appointment.

BI-RADS CATEGORY  2: Benign.

## 2023-01-20 DIAGNOSIS — E1165 Type 2 diabetes mellitus with hyperglycemia: Secondary | ICD-10-CM | POA: Diagnosis not present

## 2023-01-20 DIAGNOSIS — J449 Chronic obstructive pulmonary disease, unspecified: Secondary | ICD-10-CM | POA: Diagnosis not present

## 2023-01-23 ENCOUNTER — Ambulatory Visit (HOSPITAL_COMMUNITY)
Admission: RE | Admit: 2023-01-23 | Discharge: 2023-01-23 | Disposition: A | Discharge status: Home / Self Care | Payer: Medicare PPO | Source: Ambulatory Visit | Attending: Gerontology | Admitting: Gerontology

## 2023-01-23 DIAGNOSIS — R921 Mammographic calcification found on diagnostic imaging of breast: Secondary | ICD-10-CM | POA: Diagnosis not present

## 2023-01-23 DIAGNOSIS — N6310 Unspecified lump in the right breast, unspecified quadrant: Secondary | ICD-10-CM | POA: Diagnosis present

## 2023-01-23 DIAGNOSIS — R92323 Mammographic fibroglandular density, bilateral breasts: Secondary | ICD-10-CM | POA: Diagnosis not present

## 2023-01-23 DIAGNOSIS — N63 Unspecified lump in unspecified breast: Secondary | ICD-10-CM

## 2023-01-23 DIAGNOSIS — N641 Fat necrosis of breast: Secondary | ICD-10-CM | POA: Diagnosis not present

## 2023-01-25 DIAGNOSIS — Z7984 Long term (current) use of oral hypoglycemic drugs: Secondary | ICD-10-CM | POA: Diagnosis not present

## 2023-01-25 DIAGNOSIS — E119 Type 2 diabetes mellitus without complications: Secondary | ICD-10-CM | POA: Diagnosis not present

## 2023-01-25 DIAGNOSIS — Z961 Presence of intraocular lens: Secondary | ICD-10-CM | POA: Diagnosis not present

## 2023-01-25 DIAGNOSIS — H35353 Cystoid macular degeneration, bilateral: Secondary | ICD-10-CM | POA: Diagnosis not present

## 2023-01-30 DIAGNOSIS — F331 Major depressive disorder, recurrent, moderate: Secondary | ICD-10-CM | POA: Diagnosis not present

## 2023-01-30 DIAGNOSIS — F17210 Nicotine dependence, cigarettes, uncomplicated: Secondary | ICD-10-CM | POA: Diagnosis not present

## 2023-01-30 DIAGNOSIS — F429 Obsessive-compulsive disorder, unspecified: Secondary | ICD-10-CM | POA: Diagnosis not present

## 2023-01-30 DIAGNOSIS — F411 Generalized anxiety disorder: Secondary | ICD-10-CM | POA: Diagnosis not present

## 2023-02-02 DIAGNOSIS — H43813 Vitreous degeneration, bilateral: Secondary | ICD-10-CM | POA: Diagnosis not present

## 2023-02-02 DIAGNOSIS — H35383 Toxic maculopathy, bilateral: Secondary | ICD-10-CM | POA: Diagnosis not present

## 2023-02-02 DIAGNOSIS — E119 Type 2 diabetes mellitus without complications: Secondary | ICD-10-CM | POA: Diagnosis not present

## 2023-02-02 DIAGNOSIS — H353132 Nonexudative age-related macular degeneration, bilateral, intermediate dry stage: Secondary | ICD-10-CM | POA: Diagnosis not present

## 2023-02-15 DIAGNOSIS — R5383 Other fatigue: Secondary | ICD-10-CM | POA: Diagnosis not present

## 2023-02-15 DIAGNOSIS — J449 Chronic obstructive pulmonary disease, unspecified: Secondary | ICD-10-CM | POA: Diagnosis not present

## 2023-02-15 DIAGNOSIS — G894 Chronic pain syndrome: Secondary | ICD-10-CM | POA: Diagnosis not present

## 2023-02-15 DIAGNOSIS — Z515 Encounter for palliative care: Secondary | ICD-10-CM | POA: Diagnosis not present

## 2023-02-15 DIAGNOSIS — M797 Fibromyalgia: Secondary | ICD-10-CM | POA: Diagnosis not present

## 2023-02-15 DIAGNOSIS — G4700 Insomnia, unspecified: Secondary | ICD-10-CM | POA: Diagnosis not present

## 2023-02-20 DIAGNOSIS — J449 Chronic obstructive pulmonary disease, unspecified: Secondary | ICD-10-CM | POA: Diagnosis not present

## 2023-02-20 DIAGNOSIS — E1165 Type 2 diabetes mellitus with hyperglycemia: Secondary | ICD-10-CM | POA: Diagnosis not present

## 2023-02-27 ENCOUNTER — Other Ambulatory Visit: Payer: Self-pay | Admitting: Gastroenterology

## 2023-02-27 DIAGNOSIS — R197 Diarrhea, unspecified: Secondary | ICD-10-CM

## 2023-03-09 ENCOUNTER — Other Ambulatory Visit (HOSPITAL_COMMUNITY): Payer: Self-pay | Admitting: Gerontology

## 2023-03-09 ENCOUNTER — Ambulatory Visit (HOSPITAL_COMMUNITY)
Admission: RE | Admit: 2023-03-09 | Discharge: 2023-03-09 | Disposition: A | Discharge status: Home / Self Care | Payer: Medicare PPO | Source: Ambulatory Visit | Attending: Gerontology | Admitting: Gerontology

## 2023-03-09 DIAGNOSIS — J449 Chronic obstructive pulmonary disease, unspecified: Secondary | ICD-10-CM | POA: Diagnosis not present

## 2023-03-09 DIAGNOSIS — R059 Cough, unspecified: Secondary | ICD-10-CM | POA: Insufficient documentation

## 2023-03-09 DIAGNOSIS — E1165 Type 2 diabetes mellitus with hyperglycemia: Secondary | ICD-10-CM | POA: Diagnosis not present

## 2023-03-09 DIAGNOSIS — J9811 Atelectasis: Secondary | ICD-10-CM | POA: Diagnosis not present

## 2023-03-09 DIAGNOSIS — J441 Chronic obstructive pulmonary disease with (acute) exacerbation: Secondary | ICD-10-CM | POA: Diagnosis not present

## 2023-03-15 ENCOUNTER — Other Ambulatory Visit: Payer: Self-pay | Admitting: Gastroenterology

## 2023-03-15 DIAGNOSIS — K219 Gastro-esophageal reflux disease without esophagitis: Secondary | ICD-10-CM

## 2023-03-16 DIAGNOSIS — N3010 Interstitial cystitis (chronic) without hematuria: Secondary | ICD-10-CM | POA: Diagnosis not present

## 2023-03-16 DIAGNOSIS — N3941 Urge incontinence: Secondary | ICD-10-CM | POA: Diagnosis not present

## 2023-03-22 NOTE — Progress Notes (Signed)
Referring Provider: Benetta Spar* Primary Care Physician:  Benetta Spar, MD Primary GI Physician: Dr. Marletta Lor  Chief Complaint  Patient presents with   Follow-up    Follow up. Diarrhea after she eats.     HPI:   Alexis Harris is a 52 y.o. female with history of GERD, numerous large, greater than 3 cm, polyps in the colon, adenomatous colon polyps, s/p total colectomy 04/10/2022 with multiple tubular adenomas on pathology, recommended flex sig 1 year after total colectomy, presenting today for follow-up.  Last seen in the office 09/21/2022.  She was having anywhere from 1-4 bowel movements per day taking for loperamide and 0-2 Lomotil per day and was satisfied with her current bowel regimen.  She was not taking Metamucil as she did not like the taste.  Denies abdominal pain, BRBPR, melena.  Weight was stable.  GERD controlled on pantoprazole twice daily.  She had experienced recurrent dysphagia over the last month to items like bread and cake, but preferred to monitor.  She was found to have left lower extremity DVT earlier in June and was on Eliquis.  She is advised to continue her current medication regimen, start Benefiber or Metamucil daily to help bulk up the stool, follow-up in 6 months.   Today: For the last 1-2 months, she has had increased diarrhea.  Having 2-3 Bms per day. Stools are mushy. Some pain when she has a BM, but gets better thereafter. Didn't have pain previously.  Has had BM in the middle of the night. Had had accidents in the middle of the night. No brbpr or melena. Does have some increased gassiness.   Was taking imodium and lomotil once a day, but now waking both about 3 times a day.  Was taking benefiber here and there. Last took months ago. Can't get it to not be so gritty.  Before diarrhea picked up, was having 1 BM a day.    Started Cymbalta recently.  This does have potential for diarrhea as a side effect 6-9% of the time. Was on an  antibiotic for cellulitis a couple months ago. Had a z pack a  couple weeks ago.   No fever, chills, nausea, vomiting.   GERD well controlled on pantoprazole BID and pepcid at bedtime. No dysphagia issues at this time.    Past Medical History:  Diagnosis Date   Anxiety    Arthritis    Asthma    Back pain    Carpal tunnel syndrome    COPD (chronic obstructive pulmonary disease) (HCC)    Degenerative cervical disc    Depression    Diabetes mellitus without complication (HCC)    DJD (degenerative joint disease), lumbosacral    Fibromyalgia    GERD (gastroesophageal reflux disease)    Hypersomnia    IBS (irritable bowel syndrome)    Interstitial cystitis    Migraine    Migraines    Neuropathy    OCD (obsessive compulsive disorder)    PONV (postoperative nausea and vomiting)    PTSD (post-traumatic stress disorder)    Restless leg syndrome    Sleep apnea    reports resolved    Past Surgical History:  Procedure Laterality Date   ABDOMINAL HYSTERECTOMY     ABDOMINAL SURGERY     BALLOON DILATION N/A 04/18/2021   Procedure: BALLOON DILATION;  Surgeon: Lanelle Bal, DO;  [facility];  Service: Endoscopy;  Laterality: N/A;   BIOPSY  04/18/2021   Procedure:  BIOPSY;  Surgeon: Lanelle Bal, DO;  [facility];  Service: Endoscopy;;   CATARACT EXTRACTION W/PHACO Right 11/10/2014   Procedure: CATARACT EXTRACTION PHACO AND INTRAOCULAR LENS PLACEMENT (IOC);  Surgeon: Jethro Bolus, MD;  Location: [facility];  Service: Ophthalmology;  Laterality: Right;  CDE 2.04   CATARACT EXTRACTION W/PHACO Left 11/24/2014   Procedure: CATARACT EXTRACTION PHACO AND INTRAOCULAR LENS PLACEMENT (IOC);  Surgeon: Jethro Bolus, MD;  Location: [facility];  Service: Ophthalmology;  Laterality: Left;  CDE:1.04   COLONOSCOPY WITH PROPOFOL N/A 01/30/2022   Procedure: COLONOSCOPY WITH PROPOFOL;  Surgeon: Lanelle Bal, DO;  [facility];  Service: Endoscopy;  Laterality: N/A;   11:30am, asa 3   ESOPHAGOGASTRODUODENOSCOPY  09/2015   Dr. Devona Konig, [facility]: esopahgeal lumen appears dilated and diffusely coated with old food. s/p brushing/bx and dilation. Benign squamous mucosal showing surface mucosal necrosis and focal hyperkeratosis. no fungal elements or EOE   ESOPHAGOGASTRODUODENOSCOPY (EGD) WITH PROPOFOL N/A 04/18/2021   Procedure: ESOPHAGOGASTRODUODENOSCOPY (EGD) WITH PROPOFOL;  Surgeon: Lanelle Bal, DO;  [facility];  Service: Endoscopy;  Laterality: N/A;   EXPLORATORY LAPAROTOMY     x3   FLEXIBLE SIGMOIDOSCOPY  04/18/2021   Procedure: FLEXIBLE SIGMOIDOSCOPY;  Surgeon: Lanelle Bal, DO;  [facility];  Service: Endoscopy;;   interstim medical device implanted     in lower back-to control bladder   MOUTH SURGERY     PARTIAL COLECTOMY N/A 04/10/2022   Procedure: TOTAL COLECTOMY;  Surgeon: Lucretia Roers, MD;  Location: [facility];  Service: General;  Laterality: N/A;   SUBMUCOSAL TATTOO INJECTION  01/30/2022   Procedure: SUBMUCOSAL TATTOO INJECTION;  Surgeon: Lanelle Bal, DO;  [facility];  Service: Endoscopy;;    Current Outpatient Medications  Medication Sig Dispense Refill   albuterol (VENTOLIN HFA) 108 (90 Base) MCG/ACT inhaler Inhale 2 puffs into the lungs every 6 (six) hours as needed for wheezing or shortness of breath.     ARIPiprazole (ABILIFY) 10 MG tablet Take 10 mg by mouth in the morning.     Ascorbic Acid (VITAMIN C) 1000 MG tablet Take 1,000 mg by mouth in the morning.     atorvastatin (LIPITOR) 20 MG tablet Take 20 mg by mouth at bedtime.     buprenorphine (BUTRANS) 10 MCG/HR PTWK Place 1 patch onto the skin every Sunday.     buPROPion (WELLBUTRIN SR) 150 MG 12 hr tablet Take 150 mg by mouth 2 (two) times daily.     busPIRone (BUSPAR) 15 MG tablet Take 15 mg by mouth 3 (three) times daily.     cetirizine (ZYRTEC) 10 MG tablet Take 10 mg by mouth in the morning.     CINNAMON PO  Take 2 tablets by mouth in the morning.     cyclobenzaprine (FLEXERIL) 10 MG tablet Take 10 mg by mouth 3 (three) times daily as needed for muscle spasms.     DULoxetine (CYMBALTA) 60 MG capsule Take 120 mg by mouth daily.     estradiol (ESTRACE) 0.1 MG/GM vaginal cream Place 1 g vaginally 3 (three) times a week.     famotidine (PEPCID) 20 MG tablet TAKE 1 TABLET BY MOUTH EVERYDAY AT BEDTIME 90 tablet 2   fluticasone furoate-vilanterol (BREO ELLIPTA) 100-25 MCG/ACT AEPB Inhale 1 puff into the lungs daily.     furosemide (LASIX) 20 MG tablet Take 20 mg by mouth in the morning and at bedtime.  gabapentin (NEURONTIN) 300 MG capsule Take 300 mg by mouth 3 (three) times daily.     hydrOXYzine (ATARAX) 50 MG tablet Take 50-100 mg by mouth at bedtime as needed (sleep).     metFORMIN (GLUCOPHAGE) 500 MG tablet Take 500 mg by mouth 2 (two) times daily.     montelukast (SINGULAIR) 10 MG tablet Take 10 mg by mouth at bedtime.     Multiple Vitamin (MULTIVITAMIN WITH MINERALS) TABS tablet Take 1 tablet by mouth in the morning.     MYRBETRIQ 50 MG TB24 tablet Take 50 mg by mouth in the morning.     OMEGA 3 1200 MG CAPS Take 1,200 mg by mouth in the morning.     oxymetazoline (AFRIN) 0.05 % nasal spray Place 1 spray into both nostrils 2 (two) times daily as needed for congestion.     pantoprazole (PROTONIX) 40 MG tablet TAKE 1 TABLET (40 MG TOTAL) BY MOUTH TWICE A DAY BEFORE MEALS 180 tablet 2   Phenylephrine-Acetaminophen (SINUS + HEADACHE PO) Take 2 tablets by mouth daily as needed (sinus headaches).     SUMAtriptan (IMITREX) 100 MG tablet Take 100 mg by mouth every 2 (two) hours as needed for migraine. *TAKE ONE TABLET BY MOUTH AT ONSET OF MIGRAINE. IF MIGRAINE PERSISTS AFTER 2 HOURS, TAKE ONE ADDITIONAL TABLET. *DO NOT EXCEED MORE THAN 2 TABLETS IN A 24 HOUR PERIOD AND DO NOT USE NO MORE THAN 5 TABLETS WITHIN THE PERIOD OF 1 WEEK*     topiramate (TOPAMAX) 50 MG tablet Take 150 mg by mouth in the morning.      traZODone (DESYREL) 50 MG tablet Take 50-150 mg by mouth at bedtime.     UNABLE TO FIND Apply topically as needed. Med Name: **HOME TENS UNIT: FOR LOWER CHRONIC BACK PAIN/BACK PROBLEMS      valACYclovir (VALTREX) 500 MG tablet Take 500 mg by mouth 2 (two) times daily as needed (outbreaks).     diphenoxylate-atropine (LOMOTIL) 2.5-0.025 MG tablet Take 1 tablet by mouth 1-2 times daily to control diarrhea. 60 tablet 5   lamoTRIgine (LAMICTAL) 200 MG tablet Take 200 mg by mouth every evening.     loperamide (IMODIUM) 2 MG capsule Take 1 capsule (2 mg total) by mouth 2 (two) times daily before a meal. 60 capsule 3   No current facility-administered medications for this visit.    Allergies as of 03/23/2023 - Review Complete 03/23/2023  Allergen Reaction Noted   Advair diskus [fluticasone-salmeterol] Anaphylaxis 10/20/2011   Other Hives 12/20/2015   Codeine Nausea And Vomiting 10/20/2011   Penicillins Rash 10/20/2011    Family History  Adopted: Yes    Social History   Socioeconomic History   Marital status: Divorced    Spouse name: Not on file   Number of children: Not on file   Years of education: Not on file   Highest education level: Not on file  Occupational History   Not on file  Tobacco Use   Smoking status: Every Day    Current packs/day: 0.50    Average packs/day: 0.5 packs/day for 25.0 years (12.5 ttl pk-yrs)    Types: Cigarettes   Smokeless tobacco: Never  Vaping Use   Vaping status: Former  Substance and Sexual Activity   Alcohol use: Yes    Comment: occ   Drug use: No   Sexual activity: Yes    Birth control/protection: Surgical    Comment: hyst  Other Topics Concern   Not on file  Social History  Narrative   Not on file   Social Drivers of Health   Financial Resource Strain: Low Risk  (09/06/2022)   Received from [facility], [facility]   Overall Financial Resource Strain (CARDIA)    Difficulty of Paying Living Expenses: Not hard at all  Food  Insecurity: No Food Insecurity (09/06/2022)   Received from [facility], [facility]   Hunger Vital Sign    Worried About Running Out of Food in the Last Year: Never true    Ran Out of Food in the Last Year: Never true  Transportation Needs: No Transportation Needs (09/06/2022)   Received from [facility], [facility]   [facility] - Transportation    Lack of Transportation (Medical): No    Lack of Transportation (Non-Medical): No  Physical Activity: Inactive (08/25/2020)   Exercise Vital Sign    Days of Exercise per Week: 0 days    Minutes of Exercise per Session: 0 min  Stress: Stress Concern Present (08/25/2020)   [employer] of Occupational Health - Occupational Stress Questionnaire    Feeling of Stress : Very much  Social Connections: Socially Isolated (08/25/2020)   Social Connection and Isolation Panel [NHANES]    Frequency of Communication with Friends and Family: Twice a week    Frequency of Social Gatherings with Friends and Family: Once a week    Attends Religious Services: Never    Database administrator or Organizations: No    Attends Engineer, structural: Never    Marital Status: Divorced    Review of Systems: Gen: Denies fever, chills, cold or flulike symptoms, presyncope, syncope. CV: Denies chest pain, palpitations. Resp: Admits to shortness of breath due to history of COPD. GI: See HPI Heme: See HPI  Physical Exam: BP 110/68 (BP Location: Right Arm, Patient Position: Sitting, Cuff Size: Large)   Pulse (!) 116   Temp 97.6 F (36.4 C) (Temporal)   Ht 5\' 4"  (1.626 m)   Wt 222 lb 9.6 oz (101 kg)   BMI 38.21 kg/m  General:   Alert and oriented. No distress noted. Pleasant and cooperative.  Head:  Normocephalic and atraumatic. Eyes:  Conjuctiva clear without scleral icterus. Heart:  S1, S2 present without murmurs appreciated. Lungs:  Clear to auscultation bilaterally. No wheezes, rales, or rhonchi. No distress.  Abdomen:  +BS, soft, and  non-distended.  Very mild TTP across lower abdomen.  No rebound or guarding. No HSM or masses noted. Msk:  Symmetrical without gross deformities. Normal posture. Extremities:  Without edema. Neurologic:  Alert and  oriented x4 Psych:  Normal mood and affect.    Assessment:  52 year old female with history of GERD, numerous large, greater than 3 cm, polyps in the colon, adenomatous colon polyps s/p total colectomy 04/10/2022 with multiple tubular adenomas on pathology with recommendations for flex sig 1 year after total colectomy, presenting today for follow-up.  Diarrhea: Previously developed diarrhea after total colectomy that was well-controlled with 1 Lomotil and 1 Imodium, but for the last 1-2 months, she has had increased diarrhea, now taking 3 Lomotil and 3 Imodium daily, having 2-3 bowel movements per day and also occasionally nocturnal stool with accidents.  No BRBPR, melena, unintentional weight loss.  She does have some mild lower abdominal pain related to her bowel movements that improved thereafter.  Recently on antibiotics recently concern for infectious etiology.  She was also recently started on Cymbalta which has possible side effect of diarrhea 6-9% of the  time.  At this time, we will plan for stool studies, will check thyroid function and also screen for celiac disease.  She will continue Lomotil and Imodium for now and also start Benefiber.  History of multiple large adenomatous colon polyps: S/p total colectomy January 2024, due for surveillance flex sig next month.  GERD: Well-controlled on pantoprazole 40 mg twice daily and Pepcid at bedtime.   Plan:  Proceed with sigmoidoscopy with propofol by Dr. Marletta Lor in near future. The risks, benefits, and alternatives have been discussed with the patient in detail. The patient states understanding and desires to proceed.  ASA 3 [name] not currently on Eliquis.  She will let us know if this was resumed prior to flex sig. No  morning diabetes medications day of procedure. Stool studies-GI panel and C. difficile, TSH, IgA, TTG IgA Continue Imodium and Lomotil Resume Benefiber 3 teaspoons daily x 2 weeks, then increase to twice daily.  Okay to mix this into soft foods. Continue pantoprazole twice daily. Continue Pepcid at bedtime. Follow-up after flex sig.   Ermalinda Memos, PA-C [facility] 03/23/2023

## 2023-03-23 ENCOUNTER — Encounter: Payer: Self-pay | Admitting: Gastroenterology

## 2023-03-23 ENCOUNTER — Ambulatory Visit: Payer: Medicare PPO | Admitting: Gastroenterology

## 2023-03-23 VITALS — BP 110/68 | HR 116 | Temp 97.6°F | Ht 64.0 in | Wt 222.6 lb

## 2023-03-23 DIAGNOSIS — Z860101 Personal history of adenomatous and serrated colon polyps: Secondary | ICD-10-CM

## 2023-03-23 DIAGNOSIS — K219 Gastro-esophageal reflux disease without esophagitis: Secondary | ICD-10-CM | POA: Diagnosis not present

## 2023-03-23 DIAGNOSIS — R197 Diarrhea, unspecified: Secondary | ICD-10-CM

## 2023-03-23 MED ORDER — DIPHENOXYLATE-ATROPINE 2.5-0.025 MG PO TABS: ORAL_TABLET | ORAL | 5 refills | Status: DC

## 2023-03-23 MED ORDER — LOPERAMIDE HCL 2 MG PO CAPS: 2.0000 mg | ORAL_CAPSULE | Freq: Two times a day (BID) | ORAL | 3 refills | Status: DC

## 2023-03-23 NOTE — Patient Instructions (Signed)
Please complete stool studies and lab work at [facility].  Continue using Imodium and Lomotil.   Start Benefiber 3 teaspoons daily x 2 weeks, then increase to twice daily.  As we discussed, you can mix this into soft foods.  We will get you scheduled for a flexible sigmoidoscopy with Dr. Marletta Lor at [facility].  I will plan to follow-up with you in the office after your flexible sigmoidoscopy, but we will be in contact with you with your lab and stool study results.  It was good to see you again today!  Happy birthday! I hope you have a fantastic Christmas!   Ermalinda Memos, PA-C [facility]

## 2023-03-26 DIAGNOSIS — R197 Diarrhea, unspecified: Secondary | ICD-10-CM | POA: Diagnosis not present

## 2023-03-29 LAB — TSH: TSH: 0.41 m[IU]/L

## 2023-03-29 LAB — TISSUE TRANSGLUTAMINASE, IGA: (tTG) Ab, IgA: <1 U/mL

## 2023-03-29 LAB — IGA: Immunoglobulin A: 179 mg/dL (ref 47–310)

## 2023-03-30 ENCOUNTER — Other Ambulatory Visit: Payer: Self-pay

## 2023-03-30 DIAGNOSIS — F429 Obsessive-compulsive disorder, unspecified: Secondary | ICD-10-CM | POA: Diagnosis not present

## 2023-03-30 DIAGNOSIS — F17210 Nicotine dependence, cigarettes, uncomplicated: Secondary | ICD-10-CM | POA: Diagnosis not present

## 2023-03-30 DIAGNOSIS — I82462 Acute embolism and thrombosis of left calf muscular vein: Secondary | ICD-10-CM

## 2023-03-30 DIAGNOSIS — F411 Generalized anxiety disorder: Secondary | ICD-10-CM | POA: Diagnosis not present

## 2023-03-30 DIAGNOSIS — F331 Major depressive disorder, recurrent, moderate: Secondary | ICD-10-CM | POA: Diagnosis not present

## 2023-03-30 LAB — GIARDIA ANTIGEN
MICRO NUMBER:: 15887248
RESULT:: NOT DETECTED
SPECIMEN QUALITY:: ADEQUATE

## 2023-03-30 LAB — C. DIFFICILE GDH AND TOXIN A/B
GDH ANTIGEN: NOT DETECTED
MICRO NUMBER:: 15886370
SPECIMEN QUALITY:: ADEQUATE
TOXIN A AND B: NOT DETECTED

## 2023-03-30 LAB — GASTROINTESTINAL PATHOGEN PNL
CampyloBacter Group: NOT DETECTED
Norovirus GI/GII: NOT DETECTED
Rotavirus A: NOT DETECTED
Salmonella species: NOT DETECTED
Shiga Toxin 1: NOT DETECTED
Shiga Toxin 2: NOT DETECTED
Shigella Species: NOT DETECTED
Vibrio Group: NOT DETECTED
Yersinia enterocolitica: NOT DETECTED

## 2023-03-30 LAB — CRYPTOSPORIDIUM ANTIGEN, EIA
Specimen Quality:: ADEQUATE
micro Number:: 15887247

## 2023-03-30 NOTE — Progress Notes (Signed)
D-Dimer ordered per Cindie Crumbly, MD note on 12/21/2022 -Will repeat D-dimer in 3 months, if it is stable or lower we will discharge the patient from the clinic to follow-up with primary care.

## 2023-04-02 ENCOUNTER — Inpatient Hospital Stay: Payer: Medicare PPO | Attending: Oncology

## 2023-04-02 DIAGNOSIS — I82462 Acute embolism and thrombosis of left calf muscular vein: Secondary | ICD-10-CM | POA: Diagnosis not present

## 2023-04-02 DIAGNOSIS — Z86718 Personal history of other venous thrombosis and embolism: Secondary | ICD-10-CM | POA: Diagnosis not present

## 2023-04-02 LAB — D-DIMER, QUANTITATIVE: D-Dimer, Quant: 0.54 ug{FEU}/mL — ABNORMAL HIGH (ref 0.00–0.50)

## 2023-04-03 ENCOUNTER — Encounter: Payer: Self-pay | Admitting: *Deleted

## 2023-04-05 ENCOUNTER — Encounter: Payer: Self-pay | Admitting: *Deleted

## 2023-04-09 ENCOUNTER — Ambulatory Visit: Payer: Medicare PPO | Admitting: Oncology

## 2023-04-09 DIAGNOSIS — E1165 Type 2 diabetes mellitus with hyperglycemia: Secondary | ICD-10-CM | POA: Diagnosis not present

## 2023-04-09 DIAGNOSIS — J449 Chronic obstructive pulmonary disease, unspecified: Secondary | ICD-10-CM | POA: Diagnosis not present

## 2023-04-12 ENCOUNTER — Inpatient Hospital Stay: Payer: Medicare PPO | Admitting: Oncology

## 2023-04-12 NOTE — Patient Instructions (Signed)
 Alexis Harris  04/12/2023     @PREFPERIOPPHARMACY @   Your procedure is scheduled on  04/23/2023.   Report to [facility] at  1100  A.M.    Call this number if you have problems the morning of surgery:  [phone]  If you experience any cold or flu symptoms such as cough, fever, chills, shortness of breath, etc. between now and your scheduled surgery, please notify us  at the above number.   Remember:        DO NOT take any medications for diabetes the morning of your procedure.       Use your inhaler before you come and bring your rescue inhaler with you.   Follow the diet and prep instructions given to you by the office.   You may drink clear liquids until 0900 am on 04/23/2023.    Clear liquids allowed are:                    Water, Juice (No red color; non-citric and without pulp; diabetics please choose diet or no sugar options), Carbonated beverages (diabetics please choose diet or no sugar options), Clear Tea (No creamer, milk, or cream, including half & half and powdered creamer), Black Coffee Only (No creamer, milk or cream, including half & half and powdered creamer), and Clear Sports drink (No red color; diabetics please choose diet or no sugar options)    Take these medicines the morning of surgery with A SIP OF WATER        abilify , bupropion , buspirone , cyclebenzaprine, duloxetine, gabapentin , pantoprazole , sumatriptan , topiramate .     Do not wear jewelry, make-up or nail polish, including gel polish,  artificial nails, or any other type of covering on natural nails (fingers and  toes).  Do not wear lotions, powders, or perfumes, or deodorant.  Do not shave 48 hours prior to surgery.  Men may shave face and neck.  Do not bring valuables to the hospital.  [facility] is not responsible for any belongings or valuables.  Contacts, dentures or bridgework may not be worn into surgery.  Leave your suitcase in the car.  After surgery it may be brought to your  room.  For patients admitted to the hospital, discharge time will be determined by your treatment team.  Patients discharged the day of surgery will not be allowed to drive home and must have someone with them for 24 hours.    Special instructions:   DO NOT smoke tobacco or vape for 24 hours before your procedure.  Please read over the following fact sheets that you were given. Anesthesia Post-op Instructions and Care and Recovery After Surgery       Flexible Sigmoidoscopy, Care After The following information offers guidance on how to care for yourself after your procedure. Your health care provider may also give you more specific instructions. If you have problems or questions, contact your health care provider. What can I expect after the procedure? After the procedure, it is common to have: Cramping or pain in your abdomen. Bloating. A small amount of blood with your stool. This may happen if a sample of tissue was removed for testing (biopsy). Follow these instructions at home: Eating and drinking Drink enough fluid to keep your urine pale yellow. Follow instructions from your health care provider about what you may eat and drink. Go back to your normal diet as told by your health care provider. Avoid heavy or fried foods. These  may be hard to digest. Activity  If you were given a sedative during the procedure, it can affect you for several hours. Do not drive or operate machinery until your health care provider says that it is safe. Rest as told by your health care provider. Do not sit for a long time without moving. Get up to take short walks every 1-2 hours. This will improve blood flow and breathing. Ask for help if you feel weak or unsteady. Return to your normal activities as told by your health care provider. Ask your health care provider what activities are safe for you. General instructions Take over-the-counter and prescription medicines only as told by your health  care provider. Try walking around when you have cramps or feel bloated. Contact a health care provider if: You have pain or cramping in your abdomen that gets worse or is not helped with medicine. You have a small amount of bleeding from your rectum for more than 24 hours after your procedure. You have nausea or vomiting. You feel weak or dizzy. You have a fever. Get help right away if: You pass large blood clots or see a large amount of blood in the toilet after having a bowel movement. You have severe pain in your abdomen. This information is not intended to replace advice given to you by your health care provider. Make sure you discuss any questions you have with your health care provider. Document Revised: 08/24/2021 Document Reviewed: 08/24/2021 Elsevier Patient Education  2024 Else[employer] Monitored Anesthesia Care, Care After The following information offers guidance on how to care for yourself after your procedure. Your health care provider may also give you more specific instructions. If you have problems or questions, contact your health care provider. What can I expect after the procedure? After the procedure, it is common to have: Tiredness. Little or no memory about what happened during or after the procedure. Impaired judgment when it comes to making decisions. Nausea or vomiting. Some trouble with balance. Follow these instructions at home: For the time period you were told by your health care provider:  Rest. Do not participate in activities where you could fall or become injured. Do not drive or use machinery. Do not drink alcohol . Do not take sleeping pills or medicines that cause drowsiness. Do not make important decisions or sign legal documents. Do not take care of children on your own. Medicines Take over-the-counter and prescription medicines only as told by your health care provider. If you were prescribed antibiotics, take them as told by your health care  provider. Do not stop using the antibiotic even if you start to feel better. Eating and drinking Follow instructions from your health care provider about what you may eat and drink. Drink enough fluid to keep your urine pale yellow. If you vomit: Drink clear fluids slowly and in small amounts as you are able. Clear fluids include water, ice chips, low-calorie sports drinks, and fruit juice that has water added to it (diluted fruit juice). Eat light and bland foods in small amounts as you are able. These foods include bananas, applesauce, rice, lean meats, toast, and crackers. General instructions  Have a responsible adult stay with you for the time you are told. It is important to have someone help care for you until you are awake and alert. If you have sleep apnea, surgery and some medicines can increase your risk for breathing problems. Follow instructions from your health care provider about wearing your sleep device: When  you are sleeping. This includes during daytime naps. While taking prescription pain medicines, sleeping medicines, or medicines that make you drowsy. Do not use any products that contain nicotine or tobacco. These products include cigarettes, chewing tobacco, and vaping devices, such as e-cigarettes. If you need help quitting, ask your health care provider. Contact a health care provider if: You feel nauseous or vomit every time you eat or drink. You feel light-headed. You are still sleepy or having trouble with balance after 24 hours. You get a rash. You have a fever. You have redness or swelling around the IV site. Get help right away if: You have trouble breathing. You have new confusion after you get home. These symptoms may be an emergency. Get help right away. Call 911. Do not wait to see if the symptoms will go away. Do not drive yourself to the hospital. This information is not intended to replace advice given to you by your health care provider. Make sure you  discuss any questions you have with your health care provider. Document Revised: 08/15/2021 Document Reviewed: 08/15/2021 Elsevier Patient Education  2024 [employer].

## 2023-04-13 DIAGNOSIS — G629 Polyneuropathy, unspecified: Secondary | ICD-10-CM | POA: Diagnosis not present

## 2023-04-13 DIAGNOSIS — R109 Unspecified abdominal pain: Secondary | ICD-10-CM | POA: Diagnosis not present

## 2023-04-13 DIAGNOSIS — E860 Dehydration: Secondary | ICD-10-CM | POA: Diagnosis not present

## 2023-04-13 DIAGNOSIS — F17210 Nicotine dependence, cigarettes, uncomplicated: Secondary | ICD-10-CM | POA: Diagnosis not present

## 2023-04-13 DIAGNOSIS — K529 Noninfective gastroenteritis and colitis, unspecified: Secondary | ICD-10-CM | POA: Diagnosis not present

## 2023-04-13 DIAGNOSIS — M797 Fibromyalgia: Secondary | ICD-10-CM | POA: Diagnosis not present

## 2023-04-13 DIAGNOSIS — K828 Other specified diseases of gallbladder: Secondary | ICD-10-CM | POA: Diagnosis not present

## 2023-04-13 DIAGNOSIS — K8020 Calculus of gallbladder without cholecystitis without obstruction: Secondary | ICD-10-CM | POA: Diagnosis not present

## 2023-04-13 DIAGNOSIS — Z885 Allergy status to narcotic agent status: Secondary | ICD-10-CM | POA: Diagnosis not present

## 2023-04-13 DIAGNOSIS — E669 Obesity, unspecified: Secondary | ICD-10-CM | POA: Diagnosis not present

## 2023-04-13 DIAGNOSIS — Z880 Allergy status to penicillin: Secondary | ICD-10-CM | POA: Diagnosis not present

## 2023-04-16 ENCOUNTER — Encounter (HOSPITAL_COMMUNITY)
Admission: RE | Admit: 2023-04-16 | Discharge: 2023-04-16 | Disposition: A | Discharge status: Home / Self Care | Payer: Medicare PPO | Source: Ambulatory Visit | Attending: Internal Medicine | Admitting: Internal Medicine

## 2023-04-16 ENCOUNTER — Other Ambulatory Visit: Payer: Self-pay

## 2023-04-16 ENCOUNTER — Encounter (HOSPITAL_COMMUNITY): Payer: Self-pay

## 2023-04-16 DIAGNOSIS — E1165 Type 2 diabetes mellitus with hyperglycemia: Secondary | ICD-10-CM | POA: Insufficient documentation

## 2023-04-16 DIAGNOSIS — Z01818 Encounter for other preprocedural examination: Secondary | ICD-10-CM | POA: Diagnosis not present

## 2023-04-16 DIAGNOSIS — Z720 Tobacco use: Secondary | ICD-10-CM

## 2023-04-16 DIAGNOSIS — F17210 Nicotine dependence, cigarettes, uncomplicated: Secondary | ICD-10-CM | POA: Insufficient documentation

## 2023-04-20 ENCOUNTER — Encounter: Payer: Self-pay | Admitting: *Deleted

## 2023-04-23 ENCOUNTER — Encounter (HOSPITAL_COMMUNITY)
Admission: RE | Disposition: A | Discharge status: Home / Self Care | Payer: Self-pay | Source: Home / Self Care | Attending: Internal Medicine

## 2023-04-23 ENCOUNTER — Ambulatory Visit (HOSPITAL_COMMUNITY)
Admission: RE | Admit: 2023-04-23 | Discharge: 2023-04-23 | Disposition: A | Discharge status: Home / Self Care | Payer: Medicare PPO | Attending: Internal Medicine | Admitting: Internal Medicine

## 2023-04-23 ENCOUNTER — Ambulatory Visit (HOSPITAL_COMMUNITY): Payer: Medicare PPO | Admitting: Anesthesiology

## 2023-04-23 DIAGNOSIS — Z980 Intestinal bypass and anastomosis status: Secondary | ICD-10-CM | POA: Diagnosis not present

## 2023-04-23 DIAGNOSIS — K5989 Other specified functional intestinal disorders: Secondary | ICD-10-CM | POA: Diagnosis not present

## 2023-04-23 DIAGNOSIS — Z9049 Acquired absence of other specified parts of digestive tract: Secondary | ICD-10-CM | POA: Insufficient documentation

## 2023-04-23 DIAGNOSIS — K648 Other hemorrhoids: Secondary | ICD-10-CM

## 2023-04-23 DIAGNOSIS — Z7984 Long term (current) use of oral hypoglycemic drugs: Secondary | ICD-10-CM | POA: Insufficient documentation

## 2023-04-23 DIAGNOSIS — Z1211 Encounter for screening for malignant neoplasm of colon: Secondary | ICD-10-CM | POA: Diagnosis not present

## 2023-04-23 DIAGNOSIS — F17210 Nicotine dependence, cigarettes, uncomplicated: Secondary | ICD-10-CM | POA: Insufficient documentation

## 2023-04-23 DIAGNOSIS — E119 Type 2 diabetes mellitus without complications: Secondary | ICD-10-CM | POA: Insufficient documentation

## 2023-04-23 DIAGNOSIS — G4730 Sleep apnea, unspecified: Secondary | ICD-10-CM | POA: Diagnosis not present

## 2023-04-23 DIAGNOSIS — I10 Essential (primary) hypertension: Secondary | ICD-10-CM | POA: Diagnosis not present

## 2023-04-23 DIAGNOSIS — Z860100 Personal history of colon polyps, unspecified: Secondary | ICD-10-CM | POA: Diagnosis not present

## 2023-04-23 DIAGNOSIS — Z860101 Personal history of adenomatous and serrated colon polyps: Secondary | ICD-10-CM | POA: Diagnosis not present

## 2023-04-23 DIAGNOSIS — J4489 Other specified chronic obstructive pulmonary disease: Secondary | ICD-10-CM | POA: Diagnosis not present

## 2023-04-23 DIAGNOSIS — K529 Noninfective gastroenteritis and colitis, unspecified: Secondary | ICD-10-CM | POA: Diagnosis not present

## 2023-04-23 DIAGNOSIS — F17200 Nicotine dependence, unspecified, uncomplicated: Secondary | ICD-10-CM | POA: Diagnosis not present

## 2023-04-23 DIAGNOSIS — J449 Chronic obstructive pulmonary disease, unspecified: Secondary | ICD-10-CM | POA: Diagnosis not present

## 2023-04-23 HISTORY — PX: FLEXIBLE SIGMOIDOSCOPY: SHX5431

## 2023-04-23 HISTORY — PX: BIOPSY: SHX5522

## 2023-04-23 LAB — GLUCOSE, CAPILLARY: Glucose-Capillary: 132 mg/dL — ABNORMAL HIGH (ref 70–99)

## 2023-04-23 SURGERY — SIGMOIDOSCOPY, FLEXIBLE
Anesthesia: General

## 2023-04-23 MED ORDER — LIDOCAINE HCL (PF) 2 % IJ SOLN
INTRAMUSCULAR | Status: DC | PRN
  Administered 2023-04-23: 100 mg via INTRADERMAL

## 2023-04-23 MED ORDER — LIDOCAINE HCL (PF) 2 % IJ SOLN
INTRAMUSCULAR | Status: AC
  Filled 2023-04-23: qty 5

## 2023-04-23 MED ORDER — PROPOFOL 500 MG/50ML IV EMUL
INTRAVENOUS | Status: AC
  Filled 2023-04-23: qty 50

## 2023-04-23 MED ORDER — PROPOFOL 10 MG/ML IV BOLUS
INTRAVENOUS | Status: DC | PRN
  Administered 2023-04-23: 80 mg via INTRAVENOUS
  Administered 2023-04-23: 70 mg via INTRAVENOUS

## 2023-04-23 MED ORDER — LACTATED RINGERS IV SOLN: INTRAVENOUS | Status: DC

## 2023-04-23 NOTE — Discharge Instructions (Addendum)
  Flexible sigmoidoscopy Discharge Instructions  Read the instructions outlined below and refer to this sheet in the next few weeks. These discharge instructions provide you with general information on caring for yourself after you leave the hospital. Your doctor may also give you specific instructions. While your treatment has been planned according to the most current medical practices available, unavoidable complications occasionally occur.   ACTIVITY You may resume your regular activity, but move at a slower pace for the next 24 hours.  Take frequent rest periods for the next 24 hours.  Walking will help get rid of the air and reduce the bloated feeling in your belly (abdomen).  No driving for 24 hours (because of the medicine (anesthesia) used during the test).   Do not sign any important legal documents or operate any machinery for 24 hours (because of the anesthesia used during the test).  NUTRITION Drink plenty of fluids.  You may resume your normal diet as instructed by your doctor.  Begin with a light meal and progress to your normal diet. Heavy or fried foods are harder to digest and may make you feel sick to your stomach (nauseated).  Avoid alcoholic beverages for 24 hours or as instructed.  MEDICATIONS You may resume your normal medications unless your doctor tells you otherwise.  WHAT YOU CAN EXPECT TODAY Some feelings of bloating in the abdomen.  Passage of more gas than usual.  Spotting of blood in your stool or on the toilet paper.  IF YOU HAD POLYPS REMOVED DURING THE COLONOSCOPY: No aspirin products for 7 days or as instructed.  No alcohol for 7 days or as instructed.  Eat a soft diet for the next 24 hours.  FINDING OUT THE RESULTS OF YOUR TEST Not all test results are available during your visit. If your test results are not back during the visit, make an appointment with your caregiver to find out the results. Do not assume everything is normal if you have not heard  from your caregiver or the medical facility. It is important for you to follow up on all of your test results.  SEEK IMMEDIATE MEDICAL ATTENTION IF: You have more than a spotting of blood in your stool.  Your belly is swollen (abdominal distention).  You are nauseated or vomiting.  You have a temperature over 101.  You have abdominal pain or discomfort that is severe or gets worse throughout the day.   Overall Limited exam today due to extensive amount of stool in your colon and small bowel.  I did examine your small bowel further given enteritis seen on recent CT scan, I found evidence of inflammation and ulcerations.  I took samples of this area.  Await pathology results, office will contact you.  We will likely need to repeat sigmoidoscopy with full colon prep in the near future.  Follow-up in GI office in 4 weeks to discuss further.  I hope you have a great rest of your week!  Hennie Duos. Marletta Lor, D.O. Gastroenterology and Hepatology [facility]

## 2023-04-23 NOTE — Anesthesia Postprocedure Evaluation (Signed)
Anesthesia Post Note  Patient: Alexis Harris  Procedure(s) Performed: FLEXIBLE SIGMOIDOSCOPY BIOPSY  Patient location during evaluation: Phase II Anesthesia Type: General Level of consciousness: awake Pain management: pain level controlled Vital Signs Assessment: post-procedure vital signs reviewed and stable Respiratory status: spontaneous breathing and respiratory function stable Cardiovascular status: blood pressure returned to baseline and stable Postop Assessment: no headache and no apparent nausea or vomiting Anesthetic complications: no Comments: Late entry   No notable events documented.   Last Vitals:  Vitals:   04/23/23 1230 04/23/23 1236  BP: 98/62 101/62  Pulse: 79   Resp: 19   Temp: 36.4 C   SpO2: 99% (!) 75%    Last Pain:  Vitals:   04/23/23 1230  TempSrc: Oral  PainSc: 0-No pain                 Windell Norfolk

## 2023-04-23 NOTE — H&P (Addendum)
Primary Care Physician:  Benetta Spar, MD Primary Gastroenterologist:  Dr. Marletta Lor  Pre-Procedure History & Physical: HPI:  Alexis Harris is a 53 y.o. female is here for a colonoscopy to be performed for personal history of numerous (greater than 30) adenomatous colon polyps status post subtotal colectomy, and diarrhea.  Past Medical History:  Diagnosis Date   Anxiety    Arthritis    Asthma    Back pain    Carpal tunnel syndrome    COPD (chronic obstructive pulmonary disease) (HCC)    Degenerative cervical disc    Depression    Diabetes mellitus without complication (HCC)    DJD (degenerative joint disease), lumbosacral    Fibromyalgia    GERD (gastroesophageal reflux disease)    Hypersomnia    IBS (irritable bowel syndrome)    Interstitial cystitis    Migraine    Migraines    Neuropathy    OCD (obsessive compulsive disorder)    PONV (postoperative nausea and vomiting)    PTSD (post-traumatic stress disorder)    Restless leg syndrome    Sleep apnea    reports resolved    Past Surgical History:  Procedure Laterality Date   ABDOMINAL HYSTERECTOMY     ABDOMINAL SURGERY     BALLOON DILATION N/A 04/18/2021   Procedure: BALLOON DILATION;  Surgeon: Lanelle Bal, DO;  [facility];  Service: Endoscopy;  Laterality: N/A;   BIOPSY  04/18/2021   Procedure: BIOPSY;  Surgeon: Lanelle Bal, DO;  [facility];  Service: Endoscopy;;   CATARACT EXTRACTION W/PHACO Right 11/10/2014   Procedure: CATARACT EXTRACTION PHACO AND INTRAOCULAR LENS PLACEMENT (IOC);  Surgeon: Jethro Bolus, MD;  Location: [facility];  Service: Ophthalmology;  Laterality: Right;  CDE 2.04   CATARACT EXTRACTION W/PHACO Left 11/24/2014   Procedure: CATARACT EXTRACTION PHACO AND INTRAOCULAR LENS PLACEMENT (IOC);  Surgeon: Jethro Bolus, MD;  Location: [facility];  Service: Ophthalmology;  Laterality: Left;  CDE:1.04   COLONOSCOPY WITH PROPOFOL N/A 01/30/2022   Procedure: COLONOSCOPY WITH  PROPOFOL;  Surgeon: Lanelle Bal, DO;  [facility];  Service: Endoscopy;  Laterality: N/A;  11:30am, asa 3   ESOPHAGOGASTRODUODENOSCOPY  09/2015   Dr. Devona Konig, [facility]: esopahgeal lumen appears dilated and diffusely coated with old food. s/p brushing/bx and dilation. Benign squamous mucosal showing surface mucosal necrosis and focal hyperkeratosis. no fungal elements or EOE   ESOPHAGOGASTRODUODENOSCOPY (EGD) WITH PROPOFOL N/A 04/18/2021   Procedure: ESOPHAGOGASTRODUODENOSCOPY (EGD) WITH PROPOFOL;  Surgeon: Lanelle Bal, DO;  [facility];  Service: Endoscopy;  Laterality: N/A;   EXPLORATORY LAPAROTOMY     x3   FLEXIBLE SIGMOIDOSCOPY  04/18/2021   Procedure: FLEXIBLE SIGMOIDOSCOPY;  Surgeon: Lanelle Bal, DO;  [facility];  Service: Endoscopy;;   interstim medical device implanted     in lower back-to control bladder   MOUTH SURGERY     PARTIAL COLECTOMY N/A 04/10/2022   Procedure: TOTAL COLECTOMY;  Surgeon: Lucretia Roers, MD;  Location: [facility];  Service: General;  Laterality: N/A;   SUBMUCOSAL TATTOO INJECTION  01/30/2022   Procedure: SUBMUCOSAL TATTOO INJECTION;  Surgeon: Lanelle Bal, DO;  [facility];  Service: Endoscopy;;    Prior to Admission medications   Medication Sig Start Date End Date Taking? Authorizing Provider  albuterol (VENTOLIN HFA) 108 (90 Base) MCG/ACT inhaler Inhale 2 puffs into the lungs every 6 (six) hours as needed for wheezing or shortness  of breath.   Yes [provider]  ARIPiprazole (ABILIFY) 10 MG tablet Take 10 mg by mouth in the morning.   Yes [provider]  Ascorbic Acid (VITAMIN C) 1000 MG tablet Take 1,000 mg by mouth in the morning.   Yes [provider]  atorvastatin (LIPITOR) 20 MG tablet Take 20 mg by mouth at bedtime.   Yes [provider]  budesonide-formoterol (SYMBICORT) 80-4.5 MCG/ACT inhaler Inhale 2 puffs into the lungs 2  (two) times daily.   Yes [provider]  buPROPion (WELLBUTRIN SR) 150 MG 12 hr tablet Take 150 mg by mouth 2 (two) times daily. 12/26/21  Yes [provider]  busPIRone (BUSPAR) 15 MG tablet Take 15 mg by mouth 3 (three) times daily.   Yes [provider]  cetirizine (ZYRTEC) 10 MG tablet Take 10 mg by mouth in the morning.   Yes [provider]  CINNAMON PO Take 2 tablets by mouth in the morning.   Yes [provider]  cyclobenzaprine (FLEXERIL) 10 MG tablet Take 10 mg by mouth 3 (three) times daily as needed for muscle spasms.   Yes [provider]  diphenoxylate-atropine (LOMOTIL) 2.5-0.025 MG tablet Take 1 tablet by mouth 1-2 times daily to control diarrhea. 03/23/23  Yes Letta Median, PA-C  DULoxetine (CYMBALTA) 60 MG capsule Take 120 mg by mouth daily. 03/22/23  Yes [provider]  estradiol (ESTRACE) 0.1 MG/GM vaginal cream Place 1 g vaginally 3 (three) times a week. 04/01/22  Yes [provider]  famotidine (PEPCID) 20 MG tablet TAKE 1 TABLET BY MOUTH EVERYDAY AT BEDTIME 03/15/23  Yes Carlan, Chelsea L, NP  furosemide (LASIX) 20 MG tablet Take 20 mg by mouth in the morning and at bedtime. 06/28/21  Yes [provider]  gabapentin (NEURONTIN) 300 MG capsule Take 300 mg by mouth 3 (three) times daily.   Yes [provider]  hydrOXYzine (ATARAX) 50 MG tablet Take 50-100 mg by mouth at bedtime as needed (sleep).   Yes [provider]  lamoTRIgine (LAMICTAL) 200 MG tablet Take 200 mg by mouth every evening.   Yes [provider]  loperamide (IMODIUM) 2 MG capsule Take 1 capsule (2 mg total) by mouth 2 (two) times daily before a meal. 03/23/23  Yes Clearance Coots, Kristen S, PA-C  metFORMIN (GLUCOPHAGE) 500 MG tablet Take 500 mg by mouth 2 (two) times daily. 08/06/20  Yes [provider]  montelukast (SINGULAIR) 10 MG tablet Take 10 mg by mouth at bedtime.   Yes [provider]   Multiple Vitamin (MULTIVITAMIN WITH MINERALS) TABS tablet Take 1 tablet by mouth in the morning.   Yes [provider]  MYRBETRIQ 50 MG TB24 tablet Take 50 mg by mouth in the morning. 07/19/20  Yes [provider]  OMEGA 3 1200 MG CAPS Take 1,200 mg by mouth in the morning.   Yes [provider]  oxymetazoline (AFRIN) 0.05 % nasal spray Place 1 spray into both nostrils 2 (two) times daily as needed for congestion.   Yes [provider]  pantoprazole (PROTONIX) 40 MG tablet TAKE 1 TABLET (40 MG TOTAL) BY MOUTH TWICE A DAY BEFORE MEALS 07/21/22  Yes Mahon, Courtney L, NP  Phenylephrine-Acetaminophen (SINUS + HEADACHE PO) Take 2 tablets by mouth daily as needed (sinus headaches).   Yes [provider]  SUMAtriptan (IMITREX) 100 MG tablet Take 100 mg by mouth every 2 (two) hours as needed for migraine. *TAKE ONE TABLET BY MOUTH  AT ONSET OF MIGRAINE. IF MIGRAINE PERSISTS AFTER 2 HOURS, TAKE ONE ADDITIONAL TABLET. *DO NOT EXCEED MORE THAN 2 TABLETS IN A 24 HOUR PERIOD AND DO NOT USE NO MORE THAN 5 TABLETS WITHIN THE PERIOD OF 1 WEEK*   Yes [provider]  topiramate (TOPAMAX) 50 MG tablet Take 150 mg by mouth in the morning. 07/25/20  Yes [provider]  traZODone (DESYREL) 50 MG tablet Take 50-150 mg by mouth at bedtime.   Yes [provider]  UNABLE TO FIND Apply topically as needed. Med Name: **HOME TENS UNIT: FOR LOWER CHRONIC BACK PAIN/BACK PROBLEMS    Yes [provider]  buprenorphine (BUTRANS) 10 MCG/HR PTWK Place 1 patch onto the skin every Sunday.    [provider]  fluticasone furoate-vilanterol (BREO ELLIPTA) 100-25 MCG/ACT AEPB Inhale 1 puff into the lungs daily. Patient not taking: Reported on 04/16/2023 11/02/22   [provider]  valACYclovir (VALTREX) 500 MG tablet Take 500 mg by mouth 2 (two) times daily as needed (outbreaks).    [provider]    Allergies as of 04/03/2023 - Review  Complete 03/23/2023  Allergen Reaction Noted   Advair diskus [fluticasone-salmeterol] Anaphylaxis 10/20/2011   Other Hives 12/20/2015   Codeine Nausea And Vomiting 10/20/2011   Penicillins Rash 10/20/2011    Family History  Adopted: Yes    Social History   Socioeconomic History   Marital status: Divorced    Spouse name: Not on file   Number of children: Not on file   Years of education: Not on file   Highest education level: Not on file  Occupational History   Not on file  Tobacco Use   Smoking status: Every Day    Current packs/day: 0.50    Average packs/day: 0.5 packs/day for 25.0 years (12.5 ttl pk-yrs)    Types: Cigarettes   Smokeless tobacco: Never  Vaping Use   Vaping status: Former  Substance and Sexual Activity   Alcohol use: Yes    Comment: occ   Drug use: No   Sexual activity: Yes    Birth control/protection: Surgical    Comment: hyst  Other Topics Concern   Not on file  Social History Narrative   Not on file   Social Drivers of Health   Financial Resource Strain: Low Risk  (09/06/2022)   Received from [facility], [facility]   Overall Financial Resource Strain (CARDIA)    Difficulty of Paying Living Expenses: Not hard at all  Food Insecurity: No Food Insecurity (09/06/2022)   Received from [facility], [facility]   Hunger Vital Sign    Worried About Running Out of Food in the Last Year: Never true    Ran Out of Food in the Last Year: Never true  Transportation Needs: No Transportation Needs (09/06/2022)   Received from [facility], [facility]   [facility] - Transportation    Lack of Transportation (Medical): No    Lack of Transportation (Non-Medical): No  Physical Activity: Inactive (08/25/2020)   Exercise Vital Sign    Days of Exercise per Week: 0 days    Minutes of Exercise per Session: 0 min  Stress: Stress Concern Present (08/25/2020)   [employer] of Occupational Health - Occupational Stress Questionnaire    Feeling  of Stress : Very much  Social Connections: Socially Isolated (08/25/2020)   Social Connection and Isolation Panel [NHANES]    Frequency of Communication with Friends and Family: Twice a  week    Frequency of Social Gatherings with Friends and Family: Once a week    Attends Religious Services: Never    Database administrator or Organizations: No    Attends Banker Meetings: Never    Marital Status: Divorced  Catering manager Violence: Not At Risk (04/16/2022)   Humiliation, Afraid, Rape, and Kick questionnaire    Fear of Current or Ex-Partner: No    Emotionally Abused: No    Physically Abused: No    Sexually Abused: No    Review of Systems: See HPI, otherwise negative ROS  Physical Exam: Vital signs in last 24 hours: Temp:  [97.7 F (36.5 C)] 97.7 F (36.5 C) (01/20 1133) Pulse Rate:  [82] 82 (01/20 1133) Resp:  [18] 18 (01/20 1133) BP: (129)/(66) 129/66 (01/20 1133) SpO2:  [97 %] 97 % (01/20 1133) Weight:  [99.8 kg] 99.8 kg (01/20 1136)   General:   Alert,  Well-developed, well-nourished, pleasant and cooperative in NAD Head:  Normocephalic and atraumatic. Eyes:  Sclera clear, no icterus.   Conjunctiva pink. Ears:  Normal auditory acuity. Nose:  No deformity, discharge,  or lesions. Msk:  Symmetrical without gross deformities. Normal posture. Extremities:  Without clubbing or edema. Neurologic:  Alert and  oriented x4;  grossly normal neurologically. Skin:  Intact without significant lesions or rashes. Psych:  Alert and cooperative. Normal mood and affect.  Impression/Plan: Alexis Harris is here for a colonoscopy to be performed for personal history of numerous (greater than 30) adenomatous colon polyps status post subtotal colectomy, and diarrhea.  The risks of the procedure including infection, bleed, or perforation as well as benefits, limitations, alternatives and imponderables have been reviewed with the patient. Questions have been answered. All parties  agreeable.

## 2023-04-23 NOTE — Transfer of Care (Signed)
Immediate Anesthesia Transfer of Care Note  Patient: Alexis Harris  Procedure(s) Performed: FLEXIBLE SIGMOIDOSCOPY BIOPSY  Patient Location: Short Stay  Anesthesia Type:General  Level of Consciousness: awake, alert , oriented, and patient cooperative  Airway & Oxygen Therapy: Patient Spontanous Breathing  Post-op Assessment: Report given to RN, Post -op Vital signs reviewed and stable, and Patient moving all extremities X 4  Post vital signs: Reviewed and stable  Last Vitals:  Vitals Value Taken Time  BP 98/62 04/23/23 1230  Temp 36.4 C 04/23/23 1230  Pulse 79 04/23/23 1230  Resp 19 04/23/23 1230  SpO2 99 % 04/23/23 1230    Last Pain:  Vitals:   04/23/23 1230  TempSrc: Oral  PainSc: 0-No pain      Patients Stated Pain Goal: 8 (04/23/23 1133)  Complications: No notable events documented.

## 2023-04-23 NOTE — Anesthesia Preprocedure Evaluation (Signed)
Anesthesia Evaluation  Patient identified by MRN, date of birth, ID band Patient awake    Reviewed: Allergy & Precautions, H&P , NPO status , Patient's Chart, lab work & pertinent test results, reviewed documented beta blocker date and time   History of Anesthesia Complications (+) PONV and history of anesthetic complications  Airway Mallampati: II  TM Distance: >3 FB Neck ROM: full    Dental no notable dental hx.    Pulmonary neg pulmonary ROS, asthma , sleep apnea , COPD, Current Smoker   Pulmonary exam normal breath sounds clear to auscultation       Cardiovascular Exercise Tolerance: Good hypertension, negative cardio ROS  Rhythm:regular Rate:Normal     Neuro/Psych  Headaches PSYCHIATRIC DISORDERS Anxiety Depression     Neuromuscular disease negative neurological ROS  negative psych ROS   GI/Hepatic negative GI ROS, Neg liver ROS,GERD  ,,  Endo/Other  negative endocrine ROSdiabetes    Renal/GU negative Renal ROS  negative genitourinary   Musculoskeletal   Abdominal   Peds  Hematology negative hematology ROS (+)   Anesthesia Other Findings   Reproductive/Obstetrics negative OB ROS                             Anesthesia Physical Anesthesia Plan  ASA: 3  Anesthesia Plan: General   Post-op Pain Management:    Induction:   PONV Risk Score and Plan: Propofol infusion  Airway Management Planned:   Additional Equipment:   Intra-op Plan:   Post-operative Plan:   Informed Consent: I have reviewed the patients History and Physical, chart, labs and discussed the procedure including the risks, benefits and alternatives for the proposed anesthesia with the patient or authorized representative who has indicated his/her understanding and acceptance.     Dental Advisory Given  Plan Discussed with: CRNA  Anesthesia Plan Comments:        Anesthesia Quick Evaluation

## 2023-04-23 NOTE — Op Note (Signed)
[facility] Patient Name: Alexis Harris Procedure Date: 04/23/2023 11:53 AM MRN: [id number] Date of Birth: February 14, 1971 Attending MD: Hennie Duos. Marletta Lor , [place], [id number] CSN: [id number] Age: 53 Admit Type: Outpatient Procedure:                Flexible Sigmoidoscopy Indications:              High risk colon cancer surveillance: Personal                            history of adenoma (10 mm or greater in size), High                            risk colon cancer surveillance: Personal history of                            multiple (3 or more) adenomas, Incidental - Diarrhea Providers:                Hennie Duos. Marletta Lor, DO, Buel Ream. Thomasena Edis RN, RN,                            Dyann Ruddle Referring MD:              Medicines:                See the Anesthesia note for documentation of the                            administered medications Complications:            No immediate complications. Estimated Blood Loss:     Estimated blood loss was minimal. Procedure:                Pre-Anesthesia Assessment:                           - The anesthesia plan was to use monitored                            anesthesia care (MAC).                           After obtaining informed consent, the scope was                            passed under direct vision. The PCF-HQ190L                            ([id number]) scope was introduced through the anus and                            advanced to the the ileo-sigmoid anastomosis. The                            flexible sigmoidoscopy was accomplished without  difficulty. The patient tolerated the procedure                            well. The quality of the bowel preparation was poor. Scope In: 12:10:52 PM Scope Out: 12:23:30 PM Total Procedure Duration: 0 hours 12 minutes 38 seconds  Findings:      Non-bleeding internal hemorrhoids were found during endoscopy.      There was evidence of a prior side-to-side ileo-colonic  anastomosis in       the sigmoid colon. This was patent and was characterized by healthy       appearing mucosa though visualization incomplete due to poor prep. The       anastomosis was traversed.      Approx 15 cm into small bowel, patchy moderate inflammation       characterized by erosions, erythema and shallow ulcerations was found.       ?enteritis Biopsies were taken with a cold forceps for histology. Impression:               - Preparation of the colon was poor.                           - Non-bleeding internal hemorrhoids.                           - Patent side-to-side ileo-colonic anastomosis,                            characterized by healthy appearing mucosa.                           - Patchy moderate inflammation was found in the                            small bowel. Biopsied. Moderate Sedation:      Per Anesthesia Care Recommendation:           - Patient has a contact number available for                            emergencies. The signs and symptoms of potential                            delayed complications were discussed with the                            patient. Return to normal activities tomorrow.                            Written discharge instructions were provided to the                            patient.                           - Resume previous diet.                           -  Continue present medications.                           - Return to GI clinic in 4 weeks.                           - Overall incomplete exam today due to poor prep.                            Approximately 15 cm proximal to what appeared to be                            the ileocolonic anastomosis, evidence of                            ulcerations and inflammation of the small bowel.                            Recent CT reviewed which also showed evidence of                            enteritis. Await biopsy results to determine next                            steps. Will  likely need repeat flexible                            sigmoidoscopy with full colon prep in the next few                            months. Procedure Code(s):        --- Professional ---                           [phone], Sigmoidoscopy, flexible; with biopsy, single                            or multiple Diagnosis Code(s):        --- Professional ---                           K64.8, Other hemorrhoids                           Z98.0, Intestinal bypass and anastomosis status                           K52.9, Noninfective gastroenteritis and colitis,                            unspecified                           Z86.010, Personal history of colonic polyps CPT copyright 2022 [employer]. All rights reserved. The codes documented in this report are preliminary and upon coder review may  be revised to meet current compliance requirements. Hennie Duos. Marletta Lor, DO Hennie Duos. Marletta Lor, DO 04/23/2023 12:34:09 PM This report has been signed electronically. Number of Addenda: 0

## 2023-04-24 ENCOUNTER — Encounter (HOSPITAL_COMMUNITY): Payer: Self-pay | Admitting: Internal Medicine

## 2023-04-24 LAB — SURGICAL PATHOLOGY

## 2023-04-25 ENCOUNTER — Inpatient Hospital Stay: Payer: Medicare PPO | Attending: Oncology | Admitting: Oncology

## 2023-04-25 VITALS — BP 141/75 | HR 97 | Temp 97.2°F | Resp 19 | Ht 64.0 in | Wt 216.0 lb

## 2023-04-25 DIAGNOSIS — Z7901 Long term (current) use of anticoagulants: Secondary | ICD-10-CM | POA: Insufficient documentation

## 2023-04-25 DIAGNOSIS — Z86718 Personal history of other venous thrombosis and embolism: Secondary | ICD-10-CM | POA: Diagnosis not present

## 2023-04-25 DIAGNOSIS — Z720 Tobacco use: Secondary | ICD-10-CM | POA: Diagnosis not present

## 2023-04-25 DIAGNOSIS — I82462 Acute embolism and thrombosis of left calf muscular vein: Secondary | ICD-10-CM | POA: Diagnosis not present

## 2023-04-25 NOTE — Assessment & Plan Note (Signed)
Current smoker with a plan to quit by the end of April. Discussed the impact of smoking on overall health  -Encouraged to quit smoking by the end of April.

## 2023-04-25 NOTE — Patient Instructions (Signed)
VISIT SUMMARY:  You came in for a follow-up visit today. We discussed your history of a blood clot and your recent discontinuation of Eliquis. Your D-dimer levels are stable, which is a good sign. We also talked about your smoking habit and your plan to quit by the end of April, especially considering its impact on your eye health due to macular degeneration.  YOUR PLAN:  -HISTORY OF BLOOD CLOT: A blood clot is a clump of blood that has changed from a liquid to a gel-like or semisolid state. Your D-dimer levels are stable after stopping Eliquis, which is a positive sign. We discussed the risk of another clot and the possibility of needing lifelong anticoagulation if another clot occurs. You should continue to stay off Eliquis and follow up with your primary care provider, Dr. Felecia Shelling.  -TOBACCO USE: S We discussed your plan to quit smoking by the end of April and the benefits it will have on your overall health.  INSTRUCTIONS:  Please follow up with your primary care provider, Dr. Felecia Shelling, as discussed.

## 2023-04-25 NOTE — Progress Notes (Signed)
[facility] at [facility] HEMATOLOGY NEW VISIT  Felecia Shelling, Wayland Salinas, MD  REASON FOR REFERRAL: DVT  SUMMARY OF HEMATOLOGIC HISTORY: 09/05/22: Left leg DVT Risk factors: B/L cellulitis, smoking Repeat US: 11/16/22: Showed resolution of DVT Treatment: Eliquis started 09/2022 Stopped: 12/2022 after completion of 3 months   INTERVAL HISTORY: [name] is here for follow-up.  The patient, with a history of a blood clot and subsequent anticoagulation with Eliquis, presents for follow-up. She has recently discontinued Eliquis and reports feeling good since stopping the medication. The patient's D-dimer levels have been monitored and show a slight decrease from 0.60 to 0.54, indicating a stable or improving condition.  The patient is a current smoker and has expressed a desire to quit by the end of April.  I have reviewed the past medical history, past surgical history, social history and family history with the patient   ALLERGIES:  is allergic to advair diskus [fluticasone-salmeterol], other, codeine, and penicillins.  MEDICATIONS:  Current Outpatient Medications  Medication Sig Dispense Refill   albuterol (VENTOLIN HFA) 108 (90 Base) MCG/ACT inhaler Inhale 2 puffs into the lungs every 6 (six) hours as needed for wheezing or shortness of breath.     ARIPiprazole (ABILIFY) 10 MG tablet Take 10 mg by mouth in the morning.     Ascorbic Acid (VITAMIN C) 1000 MG tablet Take 1,000 mg by mouth in the morning.     atorvastatin (LIPITOR) 20 MG tablet Take 20 mg by mouth at bedtime.     budesonide-formoterol (SYMBICORT) 80-4.5 MCG/ACT inhaler Inhale 2 puffs into the lungs 2 (two) times daily.     buprenorphine (BUTRANS) 10 MCG/HR PTWK Place 1 patch onto the skin every Sunday.     buPROPion (WELLBUTRIN SR) 150 MG 12 hr tablet Take 150 mg by mouth 2 (two) times daily.     busPIRone (BUSPAR) 15 MG tablet Take 15 mg by mouth 3 (three) times daily.     cetirizine (ZYRTEC) 10  MG tablet Take 10 mg by mouth in the morning.     CINNAMON PO Take 2 tablets by mouth in the morning.     cyclobenzaprine (FLEXERIL) 10 MG tablet Take 10 mg by mouth 3 (three) times daily as needed for muscle spasms.     diphenoxylate-atropine (LOMOTIL) 2.5-0.025 MG tablet Take 1 tablet by mouth 1-2 times daily to control diarrhea. 60 tablet 5   DULoxetine (CYMBALTA) 60 MG capsule Take 120 mg by mouth daily.     estradiol (ESTRACE) 0.1 MG/GM vaginal cream Place 1 g vaginally 3 (three) times a week.     famotidine (PEPCID) 20 MG tablet TAKE 1 TABLET BY MOUTH EVERYDAY AT BEDTIME 90 tablet 2   furosemide (LASIX) 20 MG tablet Take 20 mg by mouth in the morning and at bedtime.     gabapentin (NEURONTIN) 300 MG capsule Take 300 mg by mouth 3 (three) times daily.     hydrOXYzine (ATARAX) 50 MG tablet Take 50-100 mg by mouth at bedtime as needed (sleep).     lamoTRIgine (LAMICTAL) 200 MG tablet Take 200 mg by mouth every evening.     loperamide (IMODIUM) 2 MG capsule Take 1 capsule (2 mg total) by mouth 2 (two) times daily before a meal. 60 capsule 3   metFORMIN (GLUCOPHAGE) 500 MG tablet Take 500 mg by mouth 2 (two) times daily.     montelukast (SINGULAIR) 10 MG tablet Take 10 mg by mouth at bedtime.     Multiple Vitamin (MULTIVITAMIN  WITH MINERALS) TABS tablet Take 1 tablet by mouth in the morning.     MYRBETRIQ 50 MG TB24 tablet Take 50 mg by mouth in the morning.     OMEGA 3 1200 MG CAPS Take 1,200 mg by mouth in the morning.     oxymetazoline (AFRIN) 0.05 % nasal spray Place 1 spray into both nostrils 2 (two) times daily as needed for congestion.     pantoprazole (PROTONIX) 40 MG tablet TAKE 1 TABLET (40 MG TOTAL) BY MOUTH TWICE A DAY BEFORE MEALS 180 tablet 2   Phenylephrine-Acetaminophen (SINUS + HEADACHE PO) Take 2 tablets by mouth daily as needed (sinus headaches).     SUMAtriptan (IMITREX) 100 MG tablet Take 100 mg by mouth every 2 (two) hours as needed for migraine. *TAKE ONE TABLET BY MOUTH  AT ONSET OF MIGRAINE. IF MIGRAINE PERSISTS AFTER 2 HOURS, TAKE ONE ADDITIONAL TABLET. *DO NOT EXCEED MORE THAN 2 TABLETS IN A 24 HOUR PERIOD AND DO NOT USE NO MORE THAN 5 TABLETS WITHIN THE PERIOD OF 1 WEEK*     topiramate (TOPAMAX) 50 MG tablet Take 150 mg by mouth in the morning.     traZODone (DESYREL) 50 MG tablet Take 50-150 mg by mouth at bedtime.     UNABLE TO FIND Apply topically as needed. Med Name: **HOME TENS UNIT: FOR LOWER CHRONIC BACK PAIN/BACK PROBLEMS      valACYclovir (VALTREX) 500 MG tablet Take 500 mg by mouth 2 (two) times daily as needed (outbreaks).     No current facility-administered medications for this visit.     REVIEW OF SYSTEMS:   Constitutional: Denies fevers, chills or night sweats Eyes: Denies blurriness of vision Ears, nose, mouth, throat, and face: Denies mucositis or sore throat Respiratory: Denies cough, dyspnea or wheezes Cardiovascular: Denies palpitation, chest discomfort or lower extremity swelling Gastrointestinal:  Denies nausea, heartburn or change in bowel habits Skin: Denies abnormal skin rashes Lymphatics: Denies new lymphadenopathy or easy bruising Neurological:Denies numbness, tingling or new weaknesses Behavioral/Psych: Mood is stable, no new changes  All other systems were reviewed with the patient and are negative.  PHYSICAL EXAMINATION:   Vitals:   04/25/23 1456  BP: (!) 141/75  Pulse: 97  Resp: 19  Temp: (!) 97.2 F (36.2 C)  SpO2: 98%    GENERAL:alert, no distress and comfortable LUNGS: clear to auscultation and percussion with normal breathing effort HEART: regular rate & rhythm and no murmurs and no lower extremity edema Musculoskeletal:no cyanosis of digits and no clubbing.  Right leg examination normal. NEURO: alert & oriented x 3 with fluent speech, no focal motor/sensory deficits  LABORATORY DATA:  I have reviewed the data as listed  Lab Results  Component Value Date   WBC 8.2 12/18/2022   NEUTROABS 5.2  12/18/2022   HGB 13.7 12/18/2022   HCT 40.6 12/18/2022   MCV 89.8 12/18/2022   PLT 267 12/18/2022      Component Value Date/Time   NA 131 (L) 04/21/2022 0313   K 4.0 04/21/2022 0313   CL 99 04/21/2022 0313   CO2 20 (L) 04/21/2022 0313   GLUCOSE 85 04/21/2022 0313   BUN 9 04/21/2022 0313   CREATININE 0.65 04/21/2022 0313   CALCIUM 8.6 (L) 04/21/2022 0313   PROT 7.2 04/16/2022 0312   ALBUMIN 3.4 (L) 04/16/2022 0312   AST 15 04/16/2022 0312   ALT 21 04/16/2022 0312   ALKPHOS 75 04/16/2022 0312   BILITOT 0.8 04/16/2022 0312   GFRNONAA >60 04/21/2022 9629  GFRAA >60 02/19/2015 1736     Latest Reference Range & Units 12/18/22 14:59 04/02/23 14:09  D-Dimer, Quant 0.00 - 0.50 ug/mL-FEU 0.60 (H) 0.54 (H)  (H): Data is abnormally high   ASSESSMENT & PLAN:  [name] a 53 year old female referred for DVT.   DVT (deep venous thrombosis) (HCC) Likely provoked with multiple risk factors [cellulitis, smoking, diabetes]. Stable D-dimer levels (0.54, previously 0.60) after discontinuation of Eliquis. Discussed the risk of recurrent clot and the need for lifelong anticoagulation if another clot occurs.  -Patient also reports falling frequently increasing the risk of bleeding.  Did not fall since her last visit -Patient completed 3 months of anticoagulation with Eliquis with recent scan of the left leg showing resolution of the clot -D-dimer is 0.54, slightly higher than normal but stable ( last one 0.60) can also be associated with history of smoking -As the patient has multiple risk factors, no children that would benefit from thrombophilia testing, I do not see benefit in thrombophilia testing as it would not change the management of this patient.  Will discharge patient from clinic to follow-up with primary care.  Tobacco use Current smoker with a plan to quit by the end of April. Discussed the impact of smoking on overall health  -Encouraged to quit smoking by the end of  April.   The total time spent in the appointment was 20 minutes encounter with patients including review of chart and various tests results, discussions about plan of care and coordination of care plan   All questions were answered. The patient knows to call the clinic with any problems, questions or concerns. No barriers to learning was detected.   Cindie Crumbly, MD 1/22/20255:23 PM

## 2023-04-25 NOTE — Assessment & Plan Note (Signed)
Likely provoked with multiple risk factors [cellulitis, smoking, diabetes]. Stable D-dimer levels (0.54, previously 0.60) after discontinuation of Eliquis. Discussed the risk of recurrent clot and the need for lifelong anticoagulation if another clot occurs.  -Patient also reports falling frequently increasing the risk of bleeding.  Did not fall since her last visit -Patient completed 3 months of anticoagulation with Eliquis with recent scan of the left leg showing resolution of the clot -D-dimer is 0.54, slightly higher than normal but stable ( last one 0.60) can also be associated with history of smoking -As the patient has multiple risk factors, no children that would benefit from thrombophilia testing, I do not see benefit in thrombophilia testing as it would not change the management of this patient.  Will discharge patient from clinic to follow-up with primary care.

## 2023-04-26 DIAGNOSIS — Z515 Encounter for palliative care: Secondary | ICD-10-CM | POA: Diagnosis not present

## 2023-04-26 DIAGNOSIS — K633 Ulcer of intestine: Secondary | ICD-10-CM | POA: Diagnosis not present

## 2023-04-26 DIAGNOSIS — J449 Chronic obstructive pulmonary disease, unspecified: Secondary | ICD-10-CM | POA: Diagnosis not present

## 2023-05-07 NOTE — Progress Notes (Signed)
 Attempted to reach patient regarding follow-up LDCT. Unable to reach patient directly. Detailed VM left asking that the patient return my call.

## 2023-05-09 ENCOUNTER — Other Ambulatory Visit: Payer: Self-pay

## 2023-05-09 DIAGNOSIS — Z122 Encounter for screening for malignant neoplasm of respiratory organs: Secondary | ICD-10-CM

## 2023-05-09 DIAGNOSIS — Z87891 Personal history of nicotine dependence: Secondary | ICD-10-CM

## 2023-05-10 DIAGNOSIS — E1165 Type 2 diabetes mellitus with hyperglycemia: Secondary | ICD-10-CM | POA: Diagnosis not present

## 2023-05-10 DIAGNOSIS — J449 Chronic obstructive pulmonary disease, unspecified: Secondary | ICD-10-CM | POA: Diagnosis not present

## 2023-05-22 DIAGNOSIS — F411 Generalized anxiety disorder: Secondary | ICD-10-CM | POA: Diagnosis not present

## 2023-05-22 DIAGNOSIS — F429 Obsessive-compulsive disorder, unspecified: Secondary | ICD-10-CM | POA: Diagnosis not present

## 2023-05-22 DIAGNOSIS — F17210 Nicotine dependence, cigarettes, uncomplicated: Secondary | ICD-10-CM | POA: Diagnosis not present

## 2023-05-22 DIAGNOSIS — F331 Major depressive disorder, recurrent, moderate: Secondary | ICD-10-CM | POA: Diagnosis not present

## 2023-05-23 ENCOUNTER — Ambulatory Visit: Payer: Medicare PPO | Admitting: Gastroenterology

## 2023-05-23 NOTE — Progress Notes (Deleted)
 Referring Provider: Benetta Spar* Primary Care Physician:  Benetta Spar, MD Primary GI Physician: Dr. Marletta Lor  No chief complaint on file.   HPI:   Alexis Harris is a 53 y.o. female with history of GERD, numerous large, greater than 3 cm, polyps in the colon, adenomatous colon polyps, s/p near total colectomy 04/10/2022 with ileocolonic anastomosis (rectosigmoid), multiple tubular adenomas on pathology, presenting today for follow-up of diarrhea.  Last seen in the office 03/23/2023.  She reported increased diarrhea for the last 1 to 2 months with 2-3 bowel movements per day, up from 1 bowel movement per day.  Also with occasional nocturnal stools occasional accidents.  Sometimes with pain related to bowel movements that improves thereafter.  Also noted increase gassiness.  Taking Imodium and Lomotil about 3 times a day, previously taking once a day.  No longer taking Benefiber.  Noted she recently started Cymbalta and had also been on antibiotics for cellulitis within the last couple of months along with a Z-Pak.  Recommended stool studies, TSH, celiac screen, continue Lomotil, Imodium, start Benefiber, and proceed with surveillance flex sig.  Labs completed 03/26/2023.  Stool studies were negative.  TSH low normal at 0.41.  Celiac screen normal.  Flexible sigmoidoscopy 04/23/2023: Nonbleeding internal hemorrhoids, patent side-to-side ileocolonic anastomosis with healthy-appearing mucosa, patchy moderate inflammation in the small bowel biopsied.  Noted exam was incomplete overall due to poor prep.  Approximately 15 cm proximal to what appeared to be the ileocolonic anastomosis, evidence of ulcerations and inflammation of the small bowel.  Pathology showed ileal mucosa with slight hyperemia, negative for active inflammation, granulomas, chronic changes.   Today:   NSAIDs:    Repeat TCS with full prep***  Past Medical History:  Diagnosis Date   Anxiety    Arthritis     Asthma    Back pain    Carpal tunnel syndrome    COPD (chronic obstructive pulmonary disease) (HCC)    Degenerative cervical disc    Depression    Diabetes mellitus without complication (HCC)    DJD (degenerative joint disease), lumbosacral    Fibromyalgia    GERD (gastroesophageal reflux disease)    Hypersomnia    IBS (irritable bowel syndrome)    Interstitial cystitis    Migraine    Migraines    Neuropathy    OCD (obsessive compulsive disorder)    PONV (postoperative nausea and vomiting)    PTSD (post-traumatic stress disorder)    Restless leg syndrome    Sleep apnea    reports resolved    Past Surgical History:  Procedure Laterality Date   ABDOMINAL HYSTERECTOMY     ABDOMINAL SURGERY     BALLOON DILATION N/A 04/18/2021   Procedure: BALLOON DILATION;  Surgeon: Lanelle Bal, DO;  [facility];  Service: Endoscopy;  Laterality: N/A;   BIOPSY  04/18/2021   Procedure: BIOPSY;  Surgeon: Lanelle Bal, DO;  [facility];  Service: Endoscopy;;   BIOPSY  04/23/2023   Procedure: BIOPSY;  Surgeon: Lanelle Bal, DO;  [facility];  Service: Endoscopy;;   CATARACT EXTRACTION W/PHACO Right 11/10/2014   Procedure: CATARACT EXTRACTION PHACO AND INTRAOCULAR LENS PLACEMENT (IOC);  Surgeon: Jethro Bolus, MD;  Location: [facility];  Service: Ophthalmology;  Laterality: Right;  CDE 2.04   CATARACT EXTRACTION W/PHACO Left 11/24/2014   Procedure: CATARACT EXTRACTION PHACO AND INTRAOCULAR LENS PLACEMENT (IOC);  Surgeon: Jethro Bolus, MD;  Location: [facility];  Service: Ophthalmology;  Laterality: Left;  CDE:1.04   COLONOSCOPY WITH PROPOFOL N/A 01/30/2022   Procedure: COLONOSCOPY WITH PROPOFOL;  Surgeon: Lanelle Bal, DO;  [facility]: [facility];  Service: Endoscopy;  Laterality: N/A;  11:30am, asa 3   ESOPHAGOGASTRODUODENOSCOPY  09/2015   Dr. Devona Konig, [facility]: esopahgeal lumen appears dilated and diffusely coated with old food. s/p  brushing/bx and dilation. Benign squamous mucosal showing surface mucosal necrosis and focal hyperkeratosis. no fungal elements or EOE   ESOPHAGOGASTRODUODENOSCOPY (EGD) WITH PROPOFOL N/A 04/18/2021   Procedure: ESOPHAGOGASTRODUODENOSCOPY (EGD) WITH PROPOFOL;  Surgeon: Lanelle Bal, DO;  [facility];  Service: Endoscopy;  Laterality: N/A;   EXPLORATORY LAPAROTOMY     x3   FLEXIBLE SIGMOIDOSCOPY  04/18/2021   Procedure: FLEXIBLE SIGMOIDOSCOPY;  Surgeon: Lanelle Bal, DO;  [facility];  Service: Endoscopy;;   FLEXIBLE SIGMOIDOSCOPY N/A 04/23/2023   Procedure: FLEXIBLE SIGMOIDOSCOPY;  Surgeon: Lanelle Bal, DO;  [facility];  Service: Endoscopy;  Laterality: N/A;  1:00 pm, asa 3   interstim medical device implanted     in lower back-to control bladder   MOUTH SURGERY     PARTIAL COLECTOMY N/A 04/10/2022   Procedure: TOTAL COLECTOMY;  Surgeon: Lucretia Roers, MD;  Location: [facility];  Service: General;  Laterality: N/A;   SUBMUCOSAL TATTOO INJECTION  01/30/2022   Procedure: SUBMUCOSAL TATTOO INJECTION;  Surgeon: Lanelle Bal, DO;  [facility];  Service: Endoscopy;;    Current Outpatient Medications  Medication Sig Dispense Refill   albuterol (VENTOLIN HFA) 108 (90 Base) MCG/ACT inhaler Inhale 2 puffs into the lungs every 6 (six) hours as needed for wheezing or shortness of breath.     ARIPiprazole (ABILIFY) 10 MG tablet Take 10 mg by mouth in the morning.     Ascorbic Acid (VITAMIN C) 1000 MG tablet Take 1,000 mg by mouth in the morning.     atorvastatin (LIPITOR) 20 MG tablet Take 20 mg by mouth at bedtime.     budesonide-formoterol (SYMBICORT) 80-4.5 MCG/ACT inhaler Inhale 2 puffs into the lungs 2 (two) times daily.     buprenorphine (BUTRANS) 10 MCG/HR PTWK Place 1 patch onto the skin every Sunday.     buPROPion (WELLBUTRIN SR) 150 MG 12 hr tablet Take 150 mg by mouth 2 (two) times daily.     busPIRone (BUSPAR) 15 MG tablet  Take 15 mg by mouth 3 (three) times daily.     cetirizine (ZYRTEC) 10 MG tablet Take 10 mg by mouth in the morning.     CINNAMON PO Take 2 tablets by mouth in the morning.     cyclobenzaprine (FLEXERIL) 10 MG tablet Take 10 mg by mouth 3 (three) times daily as needed for muscle spasms.     diphenoxylate-atropine (LOMOTIL) 2.5-0.025 MG tablet Take 1 tablet by mouth 1-2 times daily to control diarrhea. 60 tablet 5   DULoxetine (CYMBALTA) 60 MG capsule Take 120 mg by mouth daily.     estradiol (ESTRACE) 0.1 MG/GM vaginal cream Place 1 g vaginally 3 (three) times a week.     famotidine (PEPCID) 20 MG tablet TAKE 1 TABLET BY MOUTH EVERYDAY AT BEDTIME 90 tablet 2   furosemide (LASIX) 20 MG tablet Take 20 mg by mouth in the morning and at bedtime.     gabapentin (NEURONTIN) 300 MG capsule Take 300 mg by mouth 3 (three) times daily.     hydrOXYzine (ATARAX) 50 MG tablet Take 50-100 mg  by mouth at bedtime as needed (sleep).     lamoTRIgine (LAMICTAL) 200 MG tablet Take 200 mg by mouth every evening.     loperamide (IMODIUM) 2 MG capsule Take 1 capsule (2 mg total) by mouth 2 (two) times daily before a meal. 60 capsule 3   metFORMIN (GLUCOPHAGE) 500 MG tablet Take 500 mg by mouth 2 (two) times daily.     montelukast (SINGULAIR) 10 MG tablet Take 10 mg by mouth at bedtime.     Multiple Vitamin (MULTIVITAMIN WITH MINERALS) TABS tablet Take 1 tablet by mouth in the morning.     MYRBETRIQ 50 MG TB24 tablet Take 50 mg by mouth in the morning.     OMEGA 3 1200 MG CAPS Take 1,200 mg by mouth in the morning.     oxymetazoline (AFRIN) 0.05 % nasal spray Place 1 spray into both nostrils 2 (two) times daily as needed for congestion.     pantoprazole (PROTONIX) 40 MG tablet TAKE 1 TABLET (40 MG TOTAL) BY MOUTH TWICE A DAY BEFORE MEALS 180 tablet 2   Phenylephrine-Acetaminophen (SINUS + HEADACHE PO) Take 2 tablets by mouth daily as needed (sinus headaches).     SUMAtriptan (IMITREX) 100 MG tablet Take 100 mg by mouth  every 2 (two) hours as needed for migraine. *TAKE ONE TABLET BY MOUTH AT ONSET OF MIGRAINE. IF MIGRAINE PERSISTS AFTER 2 HOURS, TAKE ONE ADDITIONAL TABLET. *DO NOT EXCEED MORE THAN 2 TABLETS IN A 24 HOUR PERIOD AND DO NOT USE NO MORE THAN 5 TABLETS WITHIN THE PERIOD OF 1 WEEK*     topiramate (TOPAMAX) 50 MG tablet Take 150 mg by mouth in the morning.     traZODone (DESYREL) 50 MG tablet Take 50-150 mg by mouth at bedtime.     UNABLE TO FIND Apply topically as needed. Med Name: **HOME TENS UNIT: FOR LOWER CHRONIC BACK PAIN/BACK PROBLEMS      valACYclovir (VALTREX) 500 MG tablet Take 500 mg by mouth 2 (two) times daily as needed (outbreaks).     No current facility-administered medications for this visit.    Allergies as of 05/24/2023 - Review Complete 04/25/2023  Allergen Reaction Noted   Advair diskus [fluticasone-salmeterol] Anaphylaxis 10/20/2011   Other Hives 12/20/2015   Codeine Nausea And Vomiting 10/20/2011   Penicillins Rash 10/20/2011    Family History  Adopted: Yes    Social History   Socioeconomic History   Marital status: Divorced    Spouse name: Not on file   Number of children: Not on file   Years of education: Not on file   Highest education level: Not on file  Occupational History   Not on file  Tobacco Use   Smoking status: Every Day    Current packs/day: 0.50    Average packs/day: 0.5 packs/day for 25.0 years (12.5 ttl pk-yrs)    Types: Cigarettes   Smokeless tobacco: Never  Vaping Use   Vaping status: Former  Substance and Sexual Activity   Alcohol use: Yes    Comment: occ   Drug use: No   Sexual activity: Yes    Birth control/protection: Surgical    Comment: hyst  Other Topics Concern   Not on file  Social History Narrative   Not on file   Social Drivers of Health   Financial Resource Strain: Low Risk  (09/06/2022)   Received from [facility], [facility]   Overall Financial Resource Strain (CARDIA)    Difficulty of Paying Living  Expenses:  Not hard at all  Food Insecurity: No Food Insecurity (09/06/2022)   Received from [facility], [facility]   Hunger Vital Sign    Worried About Running Out of Food in the Last Year: Never true    Ran Out of Food in the Last Year: Never true  Transportation Needs: No Transportation Needs (09/06/2022)   Received from [facility], [facility]   [facility] - Transportation    Lack of Transportation (Medical): No    Lack of Transportation (Non-Medical): No  Physical Activity: Inactive (08/25/2020)   Exercise Vital Sign    Days of Exercise per Week: 0 days    Minutes of Exercise per Session: 0 min  Stress: Stress Concern Present (08/25/2020)   [employer] of Occupational Health - Occupational Stress Questionnaire    Feeling of Stress : Very much  Social Connections: Socially Isolated (08/25/2020)   Social Connection and Isolation Panel [NHANES]    Frequency of Communication with Friends and Family: Twice a week    Frequency of Social Gatherings with Friends and Family: Once a week    Attends Religious Services: Never    Database administrator or Organizations: No    Attends Engineer, structural: Never    Marital Status: Divorced    Review of Systems: Gen: Denies fever, chills, anorexia. Denies fatigue, weakness, weight loss.  CV: Denies chest pain, palpitations, syncope, peripheral edema, and claudication. Resp: Denies dyspnea at rest, cough, wheezing, coughing up blood, and pleurisy. GI: Denies vomiting blood, jaundice, and fecal incontinence.   Denies dysphagia or odynophagia. Derm: Denies rash, itching, dry skin Psych: Denies depression, anxiety, memory loss, confusion. No homicidal or suicidal ideation.  Heme: Denies bruising, bleeding, and enlarged lymph nodes.  Physical Exam: There were no vitals taken for this visit. General:   Alert and oriented. No distress noted. Pleasant and cooperative.  Head:  Normocephalic and atraumatic. Eyes:   Conjuctiva clear without scleral icterus. Heart:  S1, S2 present without murmurs appreciated. Lungs:  Clear to auscultation bilaterally. No wheezes, rales, or rhonchi. No distress.  Abdomen:  +BS, soft, non-tender and non-distended. No rebound or guarding. No HSM or masses noted. Msk:  Symmetrical without gross deformities. Normal posture. Extremities:  Without edema. Neurologic:  Alert and  oriented x4 Psych:  Normal mood and affect.    Assessment:     Plan:  ***   Ermalinda Memos, PA-C [facility] 05/24/2023

## 2023-05-24 ENCOUNTER — Ambulatory Visit: Payer: Medicare PPO | Admitting: Gastroenterology

## 2023-05-24 ENCOUNTER — Encounter (INDEPENDENT_AMBULATORY_CARE_PROVIDER_SITE_OTHER): Payer: Self-pay

## 2023-05-26 ENCOUNTER — Other Ambulatory Visit: Payer: Self-pay | Admitting: Gastroenterology

## 2023-05-26 DIAGNOSIS — K219 Gastro-esophageal reflux disease without esophagitis: Secondary | ICD-10-CM

## 2023-05-30 DIAGNOSIS — M5137 Other intervertebral disc degeneration, lumbosacral region with discogenic back pain only: Secondary | ICD-10-CM | POA: Diagnosis not present

## 2023-05-30 DIAGNOSIS — G894 Chronic pain syndrome: Secondary | ICD-10-CM | POA: Diagnosis not present

## 2023-05-30 DIAGNOSIS — G44229 Chronic tension-type headache, not intractable: Secondary | ICD-10-CM | POA: Diagnosis not present

## 2023-05-30 DIAGNOSIS — M48061 Spinal stenosis, lumbar region without neurogenic claudication: Secondary | ICD-10-CM | POA: Diagnosis not present

## 2023-05-30 DIAGNOSIS — G5603 Carpal tunnel syndrome, bilateral upper limbs: Secondary | ICD-10-CM | POA: Diagnosis not present

## 2023-06-01 ENCOUNTER — Encounter (INDEPENDENT_AMBULATORY_CARE_PROVIDER_SITE_OTHER): Payer: Self-pay

## 2023-06-04 ENCOUNTER — Ambulatory Visit: Payer: Medicare PPO | Admitting: Gastroenterology

## 2023-06-11 ENCOUNTER — Ambulatory Visit: Payer: Medicare PPO | Admitting: Gastroenterology

## 2023-06-11 DIAGNOSIS — E1165 Type 2 diabetes mellitus with hyperglycemia: Secondary | ICD-10-CM | POA: Diagnosis not present

## 2023-06-11 DIAGNOSIS — Z1331 Encounter for screening for depression: Secondary | ICD-10-CM | POA: Diagnosis not present

## 2023-06-11 DIAGNOSIS — E876 Hypokalemia: Secondary | ICD-10-CM | POA: Diagnosis not present

## 2023-06-11 DIAGNOSIS — Z1389 Encounter for screening for other disorder: Secondary | ICD-10-CM | POA: Diagnosis not present

## 2023-06-11 DIAGNOSIS — Z0001 Encounter for general adult medical examination with abnormal findings: Secondary | ICD-10-CM | POA: Diagnosis not present

## 2023-06-11 DIAGNOSIS — I700 Atherosclerosis of aorta: Secondary | ICD-10-CM | POA: Diagnosis not present

## 2023-06-11 DIAGNOSIS — L853 Xerosis cutis: Secondary | ICD-10-CM | POA: Diagnosis not present

## 2023-06-11 DIAGNOSIS — F17200 Nicotine dependence, unspecified, uncomplicated: Secondary | ICD-10-CM | POA: Diagnosis not present

## 2023-06-11 DIAGNOSIS — G43909 Migraine, unspecified, not intractable, without status migrainosus: Secondary | ICD-10-CM | POA: Diagnosis not present

## 2023-06-11 DIAGNOSIS — Z79899 Other long term (current) drug therapy: Secondary | ICD-10-CM | POA: Diagnosis not present

## 2023-06-14 DIAGNOSIS — M5137 Other intervertebral disc degeneration, lumbosacral region with discogenic back pain only: Secondary | ICD-10-CM | POA: Diagnosis not present

## 2023-06-17 NOTE — H&P (View-Only) (Signed)
 Referring Provider: Benetta Spar* Primary Care Physician:  Benetta Spar, MD Primary GI Physician: Dr. Marletta Lor  Chief Complaint  Patient presents with   Follow-up    Follow up. No problems     HPI:   Alexis Harris is a 53 y.o. female with history of GERD, numerous large, greater than 3 cm, polyps in the colon, adenomatous colon polyps, s/p near total colectomy 04/10/2022 with ileocolonic anastomosis (rectosigmoid), multiple tubular adenomas on pathology, presenting today for follow-up of diarrhea.   Last seen in the office 03/23/2023.  She reported increased diarrhea for the last 1 to 2 months with 2-3 bowel movements per day, up from 1 bowel movement per day.  Also with occasional nocturnal stools occasional accidents.  Sometimes with pain related to bowel movements that improves thereafter.  Also noted increase gassiness.  Taking Imodium and Lomotil about 3 times a day, previously taking once a day.  No longer taking Benefiber.  Noted she recently started Cymbalta and had also been on antibiotics for cellulitis within the last couple of months along with a Z-Pak.  Recommended stool studies, TSH, celiac screen, continue Lomotil, Imodium, start Benefiber, and proceed with surveillance flex sig.   Labs completed 03/26/2023.  Stool studies were negative.  TSH low normal at 0.41.  Celiac screen normal.   Presented [facility] for diarrhea and worsening RLQ abdominal pain.  WBC elevated at 17.  CT A/P with contrast with suspected mild infectious/inflammatory enteritis in right upper abdomen, cholelithiasis without cholecystitis, evidence of subtotal colectomy with ileocolonic anastomosis.  She was discharged home with instructions to follow a brat diet.   Flexible sigmoidoscopy 04/23/2023: Nonbleeding internal hemorrhoids, patent side-to-side ileocolonic anastomosis with healthy-appearing mucosa, patchy moderate inflammation in the small bowel biopsied.  Noted  exam was incomplete overall due to poor prep.  Approximately 15 cm proximal to what appeared to be the ileocolonic anastomosis, evidence of ulcerations and inflammation of the small bowel.  Pathology showed ileal mucosa with slight hyperemia, negative for active inflammation, granulomas, chronic changes.     Today: Reports she is feeling much better.  Typically 1 BM per day. Usually taking 1 imodium and lomotil twice a day. Sometimes 3 times a day, but not routinely. Had 3 Bms a couple days ago.  Not sure what happened, but thinks it was a fluke.  Prior abdominal pain resolved.  No nausea, vomiting.   NSAIDs: None.   GERD remains well-controlled on pantoprazole twice daily and Pepcid at bedtime.   Past Medical History:  Diagnosis Date   Anxiety    Arthritis    Asthma    Back pain    Carpal tunnel syndrome    COPD (chronic obstructive pulmonary disease) (HCC)    Degenerative cervical disc    Depression    Diabetes mellitus without complication (HCC)    DJD (degenerative joint disease), lumbosacral    Fibromyalgia    GERD (gastroesophageal reflux disease)    Hypersomnia    IBS (irritable bowel syndrome)    Interstitial cystitis    Migraine    Migraines    Neuropathy    OCD (obsessive compulsive disorder)    PONV (postoperative nausea and vomiting)    PTSD (post-traumatic stress disorder)    Restless leg syndrome    Sleep apnea    reports resolved    Past Surgical History:  Procedure Laterality Date   ABDOMINAL HYSTERECTOMY     ABDOMINAL SURGERY     BALLOON DILATION  N/A 04/18/2021   Procedure: BALLOON DILATION;  Surgeon: Lanelle Bal, DO;  [facility];  Service: Endoscopy;  Laterality: N/A;   BIOPSY  04/18/2021   Procedure: BIOPSY;  Surgeon: Lanelle Bal, DO;  [facility];  Service: Endoscopy;;   BIOPSY  04/23/2023   Procedure: BIOPSY;  Surgeon: Lanelle Bal, DO;  [facility];  Service: Endoscopy;;   CATARACT EXTRACTION  W/PHACO Right 11/10/2014   Procedure: CATARACT EXTRACTION PHACO AND INTRAOCULAR LENS PLACEMENT (IOC);  Surgeon: Jethro Bolus, MD;  Location: [facility];  Service: Ophthalmology;  Laterality: Right;  CDE 2.04   CATARACT EXTRACTION W/PHACO Left 11/24/2014   Procedure: CATARACT EXTRACTION PHACO AND INTRAOCULAR LENS PLACEMENT (IOC);  Surgeon: Jethro Bolus, MD;  Location: [facility];  Service: Ophthalmology;  Laterality: Left;  CDE:1.04   COLONOSCOPY WITH PROPOFOL N/A 01/30/2022   Procedure: COLONOSCOPY WITH PROPOFOL;  Surgeon: Lanelle Bal, DO;  [facility];  Service: Endoscopy;  Laterality: N/A;  11:30am, asa 3   ESOPHAGOGASTRODUODENOSCOPY  09/2015   Dr. Devona Konig, [facility]: esopahgeal lumen appears dilated and diffusely coated with old food. s/p brushing/bx and dilation. Benign squamous mucosal showing surface mucosal necrosis and focal hyperkeratosis. no fungal elements or EOE   ESOPHAGOGASTRODUODENOSCOPY (EGD) WITH PROPOFOL N/A 04/18/2021   Procedure: ESOPHAGOGASTRODUODENOSCOPY (EGD) WITH PROPOFOL;  Surgeon: Lanelle Bal, DO;  [facility];  Service: Endoscopy;  Laterality: N/A;   EXPLORATORY LAPAROTOMY     x3   FLEXIBLE SIGMOIDOSCOPY  04/18/2021   Procedure: FLEXIBLE SIGMOIDOSCOPY;  Surgeon: Lanelle Bal, DO;  [facility];  Service: Endoscopy;;   FLEXIBLE SIGMOIDOSCOPY N/A 04/23/2023   Procedure: FLEXIBLE SIGMOIDOSCOPY;  Surgeon: Lanelle Bal, DO;  [facility];  Service: Endoscopy;  Laterality: N/A;  1:00 pm, asa 3   interstim medical device implanted     in lower back-to control bladder   MOUTH SURGERY     PARTIAL COLECTOMY N/A 04/10/2022   Procedure: TOTAL COLECTOMY;  Surgeon: Lucretia Roers, MD;  Location: [facility];  Service: General;  Laterality: N/A;   SUBMUCOSAL TATTOO INJECTION  01/30/2022   Procedure: SUBMUCOSAL TATTOO INJECTION;  Surgeon: Lanelle Bal, DO;  [facility];  Service: Endoscopy;;     Current Outpatient Medications  Medication Sig Dispense Refill   albuterol (VENTOLIN HFA) 108 (90 Base) MCG/ACT inhaler Inhale 2 puffs into the lungs every 6 (six) hours as needed for wheezing or shortness of breath.     ARIPiprazole (ABILIFY) 10 MG tablet Take 10 mg by mouth in the morning.     Ascorbic Acid (VITAMIN C) 1000 MG tablet Take 1,000 mg by mouth in the morning.     atorvastatin (LIPITOR) 20 MG tablet Take 20 mg by mouth at bedtime.     budesonide-formoterol (SYMBICORT) 80-4.5 MCG/ACT inhaler Inhale 2 puffs into the lungs 2 (two) times daily.     buprenorphine (BUTRANS) 10 MCG/HR PTWK Place 1 patch onto the skin every Sunday.     buPROPion (WELLBUTRIN SR) 150 MG 12 hr tablet Take 150 mg by mouth 2 (two) times daily.     busPIRone (BUSPAR) 15 MG tablet Take 15 mg by mouth 3 (three) times daily.     cetirizine (ZYRTEC) 10 MG tablet Take 10 mg by mouth in the morning.     CINNAMON PO Take 2 tablets by mouth in the morning.     cyclobenzaprine (FLEXERIL) 10 MG tablet  Take 10 mg by mouth 3 (three) times daily as needed for muscle spasms.     diphenoxylate-atropine (LOMOTIL) 2.5-0.025 MG tablet Take 1 tablet by mouth 1-2 times daily to control diarrhea. 60 tablet 5   DULoxetine (CYMBALTA) 60 MG capsule Take 120 mg by mouth daily.     estradiol (ESTRACE) 0.1 MG/GM vaginal cream Place 1 g vaginally 3 (three) times a week.     famotidine (PEPCID) 20 MG tablet TAKE 1 TABLET BY MOUTH EVERYDAY AT BEDTIME 90 tablet 2   furosemide (LASIX) 20 MG tablet Take 20 mg by mouth in the morning and at bedtime.     gabapentin (NEURONTIN) 300 MG capsule Take 300 mg by mouth 3 (three) times daily.     hydrOXYzine (ATARAX) 50 MG tablet Take 50-100 mg by mouth at bedtime as needed (sleep).     lamoTRIgine (LAMICTAL) 200 MG tablet Take 200 mg by mouth every evening.     loperamide (IMODIUM) 2 MG capsule Take 1 capsule (2 mg total) by mouth 2 (two) times daily before a meal. 60 capsule 3   metFORMIN  (GLUCOPHAGE) 500 MG tablet Take 500 mg by mouth 2 (two) times daily.     montelukast (SINGULAIR) 10 MG tablet Take 10 mg by mouth at bedtime.     Multiple Vitamin (MULTIVITAMIN WITH MINERALS) TABS tablet Take 1 tablet by mouth in the morning.     MYRBETRIQ 50 MG TB24 tablet Take 50 mg by mouth in the morning.     OMEGA 3 1200 MG CAPS Take 1,200 mg by mouth in the morning.     oxymetazoline (AFRIN) 0.05 % nasal spray Place 1 spray into both nostrils 2 (two) times daily as needed for congestion.     pantoprazole (PROTONIX) 40 MG tablet TAKE 1 TABLET (40 MG TOTAL) BY MOUTH TWICE A DAY BEFORE MEALS 180 tablet 1   Phenylephrine-Acetaminophen (SINUS + HEADACHE PO) Take 2 tablets by mouth daily as needed (sinus headaches).     SUMAtriptan (IMITREX) 100 MG tablet Take 100 mg by mouth every 2 (two) hours as needed for migraine. *TAKE ONE TABLET BY MOUTH AT ONSET OF MIGRAINE. IF MIGRAINE PERSISTS AFTER 2 HOURS, TAKE ONE ADDITIONAL TABLET. *DO NOT EXCEED MORE THAN 2 TABLETS IN A 24 HOUR PERIOD AND DO NOT USE NO MORE THAN 5 TABLETS WITHIN THE PERIOD OF 1 WEEK*     topiramate (TOPAMAX) 50 MG tablet Take 150 mg by mouth in the morning.     traZODone (DESYREL) 50 MG tablet Take 50-150 mg by mouth at bedtime.     UNABLE TO FIND Apply topically as needed. Med Name: **HOME TENS UNIT: FOR LOWER CHRONIC BACK PAIN/BACK PROBLEMS      valACYclovir (VALTREX) 500 MG tablet Take 500 mg by mouth 2 (two) times daily as needed (outbreaks).     No current facility-administered medications for this visit.    Allergies as of 06/18/2023 - Review Complete 06/18/2023  Allergen Reaction Noted   Advair diskus [fluticasone-salmeterol] Anaphylaxis 10/20/2011   Other Hives 12/20/2015   Codeine Nausea And Vomiting 10/20/2011   Penicillins Rash 10/20/2011    Family History  Adopted: Yes    Social History   Socioeconomic History   Marital status: Divorced    Spouse name: Not on file   Number of children: Not on file   Years  of education: Not on file   Highest education level: Not on file  Occupational History   Not on file  Tobacco Use  Smoking status: Every Day    Current packs/day: 0.50    Average packs/day: 0.5 packs/day for 25.0 years (12.5 ttl pk-yrs)    Types: Cigarettes   Smokeless tobacco: Never  Vaping Use   Vaping status: Former  Substance and Sexual Activity   Alcohol use: Yes    Comment: occ   Drug use: No   Sexual activity: Yes    Birth control/protection: Surgical    Comment: hyst  Other Topics Concern   Not on file  Social History Narrative   Not on file   Social Drivers of Health   Financial Resource Strain: Low Risk  (09/06/2022)   Received from [facility], [facility]   Overall Financial Resource Strain (CARDIA)    Difficulty of Paying Living Expenses: Not hard at all  Food Insecurity: No Food Insecurity (09/06/2022)   Received from [facility], [facility]   Hunger Vital Sign    Worried About Running Out of Food in the Last Year: Never true    Ran Out of Food in the Last Year: Never true  Transportation Needs: No Transportation Needs (09/06/2022)   Received from [facility], [facility]   [facility] - Transportation    Lack of Transportation (Medical): No    Lack of Transportation (Non-Medical): No  Physical Activity: Inactive (08/25/2020)   Exercise Vital Sign    Days of Exercise per Week: 0 days    Minutes of Exercise per Session: 0 min  Stress: Stress Concern Present (08/25/2020)   [employer] of Occupational Health - Occupational Stress Questionnaire    Feeling of Stress : Very much  Social Connections: Socially Isolated (08/25/2020)   Social Connection and Isolation Panel [NHANES]    Frequency of Communication with Friends and Family: Twice a week    Frequency of Social Gatherings with Friends and Family: Once a week    Attends Religious Services: Never    Database administrator or Organizations: No    Attends Engineer, structural:  Never    Marital Status: Divorced    Review of Systems: Gen: Denies fever, chills, cold or flu like symptoms, presyncope, syncope. GI: See HPI Heme: See HPI  Physical Exam: BP 105/70 (BP Location: Right Arm, Patient Position: Sitting, Cuff Size: Large)   Pulse (!) 108   Temp 97.6 F (36.4 C) (Temporal)   Ht 5\' 4"  (1.626 m)   Wt 209 lb 9.6 oz (95.1 kg)   BMI 35.98 kg/m  General:   Alert and oriented. No distress noted. Pleasant and cooperative.  Head:  Normocephalic and atraumatic. Eyes:  Conjuctiva clear without scleral icterus. Abdomen:  +BS, soft, and non-distended. Minimal TTP in RLQ. No rebound or guarding. No HSM or masses noted. Msk:  Symmetrical without gross deformities. Normal posture. Extremities:  Without edema. Neurologic:  Alert and  oriented x4 Psych:  Normal mood and affect.    Assessment:  53 y.o. female with history of GERD, numerous large, greater than 3 cm, polyps in the colon, adenomatous colon polyps, s/p near total colectomy 04/10/2022 with ileocolonic anastomosis (rectosigmoid), multiple tubular adenomas on pathology, presenting today for follow-up of acute on chronic diarrhea.   Acute on chronic diarrhea:  Developed acute on chronic diarrhea in the latter part of 2024 requiring increased doses of Imodium and Lomotil.  Stool studies in December including C. difficile, GI path panel, Giardia, Cryptosporidium all negative.  TSH within normal limits.  Celiac screen negative.  Evaluated at [facility]  [place] 04/13/2023 due to worsening RLQ abdominal pain and was found to have an elevated white count, CT with dilated small bowel in the right mid abdomen, mild wall thickening in the right upper abdomen with surrounding trace fluid suggesting infectious or inflammatory enteritis.  No antibiotics were prescribed.  Flexible sigmoidoscopy 04/23/2023 with patent side-to-side ileocolonic anastomosis with healthy-appearing mucosa, patchy moderate inflammation with ulceration in the  small bowel biopsied, but exam was incomplete overall due to poor prep.  Pathology showed slight hyperemia, negative for active inflammation, granulomas, chronic change.  Encouragingly, patient reports diarrhea and abdominal pain have resolved and she is back to baseline taking Imodium and Lomotil twice daily.  Etiology of acute enteritis/acute on chronic diarrhea is not clear. Suspect self limiting infectious enteritis. As she needs repeat flex sig due to poor prep, we will be able to re-evaluate this area to ensure inflammation has resolved.    History of multiple large adenomatous colon polyps: S/p total colectomy January 2024. Recent flex sig without polyps, inflammation in the small bowel with benign biopsies as per above, exam incomplete due to poor prep.  Due to poor prep, patient need repeat flex sig with complete bowel prep.  GERD: Well-controlled on pantoprazole 40 mg twice daily and Pepcid at bedtime.    Plan:  Proceed with flex sig with bowel prep with propofol by Dr. Marletta Lor in near future. The risks, benefits, and alternatives have been discussed with the patient in detail. The patient states understanding and desires to proceed.  ASA 3 Hold imodium and lomotil 48 hours prior to starting colon prep.  Continue pantoprazole 40 mg twice daily and Pepcid at bedtime. Follow-up in 6 months.    Ermalinda Memos, PA-C [facility] 06/18/2023

## 2023-06-17 NOTE — Progress Notes (Unsigned)
 Referring Provider: Benetta Spar* Primary Care Physician:  Benetta Spar, MD Primary GI Physician: Dr. Marletta Lor  No chief complaint on file.   HPI:   Alexis Harris is a 53 y.o. female with history of GERD, numerous large, greater than 3 cm, polyps in the colon, adenomatous colon polyps, s/p near total colectomy 04/10/2022 with ileocolonic anastomosis (rectosigmoid), multiple tubular adenomas on pathology, presenting today for follow-up of diarrhea.   Last seen in the office 03/23/2023.  She reported increased diarrhea for the last 1 to 2 months with 2-3 bowel movements per day, up from 1 bowel movement per day.  Also with occasional nocturnal stools occasional accidents.  Sometimes with pain related to bowel movements that improves thereafter.  Also noted increase gassiness.  Taking Imodium and Lomotil about 3 times a day, previously taking once a day.  No longer taking Benefiber.  Noted she recently started Cymbalta and had also been on antibiotics for cellulitis within the last couple of months along with a Z-Pak.  Recommended stool studies, TSH, celiac screen, continue Lomotil, Imodium, start Benefiber, and proceed with surveillance flex sig.   Labs completed 03/26/2023.  Stool studies were negative.  TSH low normal at 0.41.  Celiac screen normal.   Flexible sigmoidoscopy 04/23/2023: Nonbleeding internal hemorrhoids, patent side-to-side ileocolonic anastomosis with healthy-appearing mucosa, patchy moderate inflammation in the small bowel biopsied.  Noted exam was incomplete overall due to poor prep.  Approximately 15 cm proximal to what appeared to be the ileocolonic anastomosis, evidence of ulcerations and inflammation of the small bowel.  Pathology showed ileal mucosa with slight hyperemia, negative for active inflammation, granulomas, chronic changes.     Today:     NSAIDs:      Repeat TCS with full prep***  Past Medical History:  Diagnosis Date   Anxiety     Arthritis    Asthma    Back pain    Carpal tunnel syndrome    COPD (chronic obstructive pulmonary disease) (HCC)    Degenerative cervical disc    Depression    Diabetes mellitus without complication (HCC)    DJD (degenerative joint disease), lumbosacral    Fibromyalgia    GERD (gastroesophageal reflux disease)    Hypersomnia    IBS (irritable bowel syndrome)    Interstitial cystitis    Migraine    Migraines    Neuropathy    OCD (obsessive compulsive disorder)    PONV (postoperative nausea and vomiting)    PTSD (post-traumatic stress disorder)    Restless leg syndrome    Sleep apnea    reports resolved    Past Surgical History:  Procedure Laterality Date   ABDOMINAL HYSTERECTOMY     ABDOMINAL SURGERY     BALLOON DILATION N/A 04/18/2021   Procedure: BALLOON DILATION;  Surgeon: Lanelle Bal, DO;  [facility];  Service: Endoscopy;  Laterality: N/A;   BIOPSY  04/18/2021   Procedure: BIOPSY;  Surgeon: Lanelle Bal, DO;  [facility];  Service: Endoscopy;;   BIOPSY  04/23/2023   Procedure: BIOPSY;  Surgeon: Lanelle Bal, DO;  [facility];  Service: Endoscopy;;   CATARACT EXTRACTION W/PHACO Right 11/10/2014   Procedure: CATARACT EXTRACTION PHACO AND INTRAOCULAR LENS PLACEMENT (IOC);  Surgeon: Jethro Bolus, MD;  Location: [facility];  Service: Ophthalmology;  Laterality: Right;  CDE 2.04   CATARACT EXTRACTION W/PHACO Left 11/24/2014   Procedure: CATARACT EXTRACTION PHACO AND INTRAOCULAR LENS PLACEMENT (IOC);  Surgeon: Loraine Leriche  Nile Riggs, MD;  Location: [facility];  Service: Ophthalmology;  Laterality: Left;  CDE:1.04   COLONOSCOPY WITH PROPOFOL N/A 01/30/2022   Procedure: COLONOSCOPY WITH PROPOFOL;  Surgeon: Lanelle Bal, DO;  [facility];  Service: Endoscopy;  Laterality: N/A;  11:30am, asa 3   ESOPHAGOGASTRODUODENOSCOPY  09/2015   Dr. Devona Konig, [facility]: esopahgeal lumen appears dilated and diffusely coated with old  food. s/p brushing/bx and dilation. Benign squamous mucosal showing surface mucosal necrosis and focal hyperkeratosis. no fungal elements or EOE   ESOPHAGOGASTRODUODENOSCOPY (EGD) WITH PROPOFOL N/A 04/18/2021   Procedure: ESOPHAGOGASTRODUODENOSCOPY (EGD) WITH PROPOFOL;  Surgeon: Lanelle Bal, DO;  [facility];  Service: Endoscopy;  Laterality: N/A;   EXPLORATORY LAPAROTOMY     x3   FLEXIBLE SIGMOIDOSCOPY  04/18/2021   Procedure: FLEXIBLE SIGMOIDOSCOPY;  Surgeon: Lanelle Bal, DO;  [facility];  Service: Endoscopy;;   FLEXIBLE SIGMOIDOSCOPY N/A 04/23/2023   Procedure: FLEXIBLE SIGMOIDOSCOPY;  Surgeon: Lanelle Bal, DO;  [facility];  Service: Endoscopy;  Laterality: N/A;  1:00 pm, asa 3   interstim medical device implanted     in lower back-to control bladder   MOUTH SURGERY     PARTIAL COLECTOMY N/A 04/10/2022   Procedure: TOTAL COLECTOMY;  Surgeon: Lucretia Roers, MD;  Location: [facility];  Service: General;  Laterality: N/A;   SUBMUCOSAL TATTOO INJECTION  01/30/2022   Procedure: SUBMUCOSAL TATTOO INJECTION;  Surgeon: Lanelle Bal, DO;  [facility];  Service: Endoscopy;;    Current Outpatient Medications  Medication Sig Dispense Refill   albuterol (VENTOLIN HFA) 108 (90 Base) MCG/ACT inhaler Inhale 2 puffs into the lungs every 6 (six) hours as needed for wheezing or shortness of breath.     ARIPiprazole (ABILIFY) 10 MG tablet Take 10 mg by mouth in the morning.     Ascorbic Acid (VITAMIN C) 1000 MG tablet Take 1,000 mg by mouth in the morning.     atorvastatin (LIPITOR) 20 MG tablet Take 20 mg by mouth at bedtime.     budesonide-formoterol (SYMBICORT) 80-4.5 MCG/ACT inhaler Inhale 2 puffs into the lungs 2 (two) times daily.     buprenorphine (BUTRANS) 10 MCG/HR PTWK Place 1 patch onto the skin every Sunday.     buPROPion (WELLBUTRIN SR) 150 MG 12 hr tablet Take 150 mg by mouth 2 (two) times daily.     busPIRone (BUSPAR) 15 MG  tablet Take 15 mg by mouth 3 (three) times daily.     cetirizine (ZYRTEC) 10 MG tablet Take 10 mg by mouth in the morning.     CINNAMON PO Take 2 tablets by mouth in the morning.     cyclobenzaprine (FLEXERIL) 10 MG tablet Take 10 mg by mouth 3 (three) times daily as needed for muscle spasms.     diphenoxylate-atropine (LOMOTIL) 2.5-0.025 MG tablet Take 1 tablet by mouth 1-2 times daily to control diarrhea. 60 tablet 5   DULoxetine (CYMBALTA) 60 MG capsule Take 120 mg by mouth daily.     estradiol (ESTRACE) 0.1 MG/GM vaginal cream Place 1 g vaginally 3 (three) times a week.     famotidine (PEPCID) 20 MG tablet TAKE 1 TABLET BY MOUTH EVERYDAY AT BEDTIME 90 tablet 2   furosemide (LASIX) 20 MG tablet Take 20 mg by mouth in the morning and at bedtime.     gabapentin (NEURONTIN) 300 MG capsule Take 300 mg by mouth 3 (three) times daily.  hydrOXYzine (ATARAX) 50 MG tablet Take 50-100 mg by mouth at bedtime as needed (sleep).     lamoTRIgine (LAMICTAL) 200 MG tablet Take 200 mg by mouth every evening.     loperamide (IMODIUM) 2 MG capsule Take 1 capsule (2 mg total) by mouth 2 (two) times daily before a meal. 60 capsule 3   metFORMIN (GLUCOPHAGE) 500 MG tablet Take 500 mg by mouth 2 (two) times daily.     montelukast (SINGULAIR) 10 MG tablet Take 10 mg by mouth at bedtime.     Multiple Vitamin (MULTIVITAMIN WITH MINERALS) TABS tablet Take 1 tablet by mouth in the morning.     MYRBETRIQ 50 MG TB24 tablet Take 50 mg by mouth in the morning.     OMEGA 3 1200 MG CAPS Take 1,200 mg by mouth in the morning.     oxymetazoline (AFRIN) 0.05 % nasal spray Place 1 spray into both nostrils 2 (two) times daily as needed for congestion.     pantoprazole (PROTONIX) 40 MG tablet TAKE 1 TABLET (40 MG TOTAL) BY MOUTH TWICE A DAY BEFORE MEALS 180 tablet 1   Phenylephrine-Acetaminophen (SINUS + HEADACHE PO) Take 2 tablets by mouth daily as needed (sinus headaches).     SUMAtriptan (IMITREX) 100 MG tablet Take 100 mg by  mouth every 2 (two) hours as needed for migraine. *TAKE ONE TABLET BY MOUTH AT ONSET OF MIGRAINE. IF MIGRAINE PERSISTS AFTER 2 HOURS, TAKE ONE ADDITIONAL TABLET. *DO NOT EXCEED MORE THAN 2 TABLETS IN A 24 HOUR PERIOD AND DO NOT USE NO MORE THAN 5 TABLETS WITHIN THE PERIOD OF 1 WEEK*     topiramate (TOPAMAX) 50 MG tablet Take 150 mg by mouth in the morning.     traZODone (DESYREL) 50 MG tablet Take 50-150 mg by mouth at bedtime.     UNABLE TO FIND Apply topically as needed. Med Name: **HOME TENS UNIT: FOR LOWER CHRONIC BACK PAIN/BACK PROBLEMS      valACYclovir (VALTREX) 500 MG tablet Take 500 mg by mouth 2 (two) times daily as needed (outbreaks).     No current facility-administered medications for this visit.    Allergies as of 06/18/2023 - Review Complete 04/25/2023  Allergen Reaction Noted   Advair diskus [fluticasone-salmeterol] Anaphylaxis 10/20/2011   Other Hives 12/20/2015   Codeine Nausea And Vomiting 10/20/2011   Penicillins Rash 10/20/2011    Family History  Adopted: Yes    Social History   Socioeconomic History   Marital status: Divorced    Spouse name: Not on file   Number of children: Not on file   Years of education: Not on file   Highest education level: Not on file  Occupational History   Not on file  Tobacco Use   Smoking status: Every Day    Current packs/day: 0.50    Average packs/day: 0.5 packs/day for 25.0 years (12.5 ttl pk-yrs)    Types: Cigarettes   Smokeless tobacco: Never  Vaping Use   Vaping status: Former  Substance and Sexual Activity   Alcohol use: Yes    Comment: occ   Drug use: No   Sexual activity: Yes    Birth control/protection: Surgical    Comment: hyst  Other Topics Concern   Not on file  Social History Narrative   Not on file   Social Drivers of Health   Financial Resource Strain: Low Risk  (09/06/2022)   Received from [facility], [facility]   Overall Financial Resource Strain (CARDIA)  Difficulty of Paying Living  Expenses: Not hard at all  Food Insecurity: No Food Insecurity (09/06/2022)   Received from [facility], [facility]   Hunger Vital Sign    Worried About Running Out of Food in the Last Year: Never true    Ran Out of Food in the Last Year: Never true  Transportation Needs: No Transportation Needs (09/06/2022)   Received from [facility], [facility]   [facility] - Transportation    Lack of Transportation (Medical): No    Lack of Transportation (Non-Medical): No  Physical Activity: Inactive (08/25/2020)   Exercise Vital Sign    Days of Exercise per Week: 0 days    Minutes of Exercise per Session: 0 min  Stress: Stress Concern Present (08/25/2020)   [employer] of Occupational Health - Occupational Stress Questionnaire    Feeling of Stress : Very much  Social Connections: Socially Isolated (08/25/2020)   Social Connection and Isolation Panel [NHANES]    Frequency of Communication with Friends and Family: Twice a week    Frequency of Social Gatherings with Friends and Family: Once a week    Attends Religious Services: Never    Database administrator or Organizations: No    Attends Engineer, structural: Never    Marital Status: Divorced    Review of Systems: Gen: Denies fever, chills, anorexia. Denies fatigue, weakness, weight loss.  CV: Denies chest pain, palpitations, syncope, peripheral edema, and claudication. Resp: Denies dyspnea at rest, cough, wheezing, coughing up blood, and pleurisy. GI: Denies vomiting blood, jaundice, and fecal incontinence.   Denies dysphagia or odynophagia. Derm: Denies rash, itching, dry skin Psych: Denies depression, anxiety, memory loss, confusion. No homicidal or suicidal ideation.  Heme: Denies bruising, bleeding, and enlarged lymph nodes.  Physical Exam: There were no vitals taken for this visit. General:   Alert and oriented. No distress noted. Pleasant and cooperative.  Head:  Normocephalic and atraumatic. Eyes:   Conjuctiva clear without scleral icterus. Heart:  S1, S2 present without murmurs appreciated. Lungs:  Clear to auscultation bilaterally. No wheezes, rales, or rhonchi. No distress.  Abdomen:  +BS, soft, non-tender and non-distended. No rebound or guarding. No HSM or masses noted. Msk:  Symmetrical without gross deformities. Normal posture. Extremities:  Without edema. Neurologic:  Alert and  oriented x4 Psych:  Normal mood and affect.    Assessment:     Plan:  ***   Ermalinda Memos, PA-C [facility] 06/18/2023

## 2023-06-18 ENCOUNTER — Telehealth: Payer: Self-pay | Admitting: Gastroenterology

## 2023-06-18 ENCOUNTER — Ambulatory Visit (INDEPENDENT_AMBULATORY_CARE_PROVIDER_SITE_OTHER): Admitting: Gastroenterology

## 2023-06-18 ENCOUNTER — Encounter: Payer: Self-pay | Admitting: Gastroenterology

## 2023-06-18 VITALS — BP 105/70 | HR 108 | Temp 97.6°F | Ht 64.0 in | Wt 209.6 lb

## 2023-06-18 DIAGNOSIS — R933 Abnormal findings on diagnostic imaging of other parts of digestive tract: Secondary | ICD-10-CM | POA: Diagnosis not present

## 2023-06-18 DIAGNOSIS — K219 Gastro-esophageal reflux disease without esophagitis: Secondary | ICD-10-CM | POA: Diagnosis not present

## 2023-06-18 DIAGNOSIS — R197 Diarrhea, unspecified: Secondary | ICD-10-CM

## 2023-06-18 DIAGNOSIS — Z860101 Personal history of adenomatous and serrated colon polyps: Secondary | ICD-10-CM | POA: Diagnosis not present

## 2023-06-18 MED ORDER — PEG 3350-KCL-NA BICARB-NACL 420 G PO SOLR: 4000.0000 mL | Freq: Once | ORAL | 0 refills | Status: AC

## 2023-06-18 NOTE — Addendum Note (Signed)
 Addended by: Armstead Peaks on: 06/18/2023 03:17 PM   Modules accepted: Orders

## 2023-06-18 NOTE — Telephone Encounter (Signed)
 Per [facility]here for flex sig "Prior authorization is not required for this code"

## 2023-06-18 NOTE — Telephone Encounter (Signed)
 Spoke with pt.  She has been scheduled for 4/7 at 8am. Aware will send in prep to pharmacy and send instructions. Aware meds to stop and when.

## 2023-06-18 NOTE — Patient Instructions (Signed)
 Continue taking Imodium and Lomotil as you are.  I will discuss with Dr. Marletta Lor whether or not he recommends repeating a colonoscopy.  I am glad you are feeling much better overall!  Ermalinda Memos, PA-C [facility]

## 2023-06-18 NOTE — Telephone Encounter (Signed)
 Alexis Harris/Alexis Harris: Please arrange flex sig with  Dr. Marletta Lor for history of adenomatous colon polyps.  She needs a full bowel prep as she recently had poor prep in January.  ASA 3  Hold imodium and lomotil 48 hours prior to starting colon prep.   *Please note, patient was aware of this when I saw her in the office today;  I just had to confirm with Dr. Marletta Lor.

## 2023-06-19 ENCOUNTER — Encounter: Payer: Self-pay | Admitting: *Deleted

## 2023-06-19 DIAGNOSIS — F411 Generalized anxiety disorder: Secondary | ICD-10-CM | POA: Diagnosis not present

## 2023-06-19 DIAGNOSIS — F331 Major depressive disorder, recurrent, moderate: Secondary | ICD-10-CM | POA: Diagnosis not present

## 2023-06-19 DIAGNOSIS — F429 Obsessive-compulsive disorder, unspecified: Secondary | ICD-10-CM | POA: Diagnosis not present

## 2023-06-19 DIAGNOSIS — F17210 Nicotine dependence, cigarettes, uncomplicated: Secondary | ICD-10-CM | POA: Diagnosis not present

## 2023-06-21 ENCOUNTER — Telehealth: Payer: Self-pay | Admitting: *Deleted

## 2023-06-21 NOTE — Telephone Encounter (Signed)
 Pt called in unable to do pre-op on 4/2. Message sent to endo and they rescheduled to 4/3. I called pt and made her aware of new pre-op appointment.

## 2023-07-04 ENCOUNTER — Other Ambulatory Visit (HOSPITAL_COMMUNITY)

## 2023-07-04 NOTE — Patient Instructions (Signed)
 Alexis Harris  07/04/2023     @PREFPERIOPPHARMACY @   Your procedure is scheduled on  07/09/2023.   [facility] at  [phone] A.M.   Call this number if you have problems the morning of surgery:  [phone]  If you experience any cold or flu symptoms such as cough, fever, chills, shortness of breath, etc. between now and your scheduled surgery, please notify us at the above number.   Remember:         Use your inhalers before you come and bring your rescue inhaler with you.   Follow the diet and prep instructions given to you by the office.   You may drink clear liquids until  0430 am on 07/09/2023.    Clear liquids allowed are:                    Water, Juice (No red color; non-citric and without pulp; diabetics please choose diet or no sugar options), Carbonated beverages (diabetics please choose diet or no sugar options), Clear Tea (No creamer, milk, or cream, including half & half and powdered creamer), Black Coffee Only (No creamer, milk or cream, including half & half and powdered creamer), and Clear Sports drink (No red color; diabetics please choose diet or no sugar options)    Take these medicines the morning of surgery with A SIP OF WATER         abilify, bupropion, buspirone, cetirizine, cyclobenzaprine (if needed), duloxetine, gabapentin, pantoprazole, sumatriptan(if needed), topiramate.     Do not wear jewelry, make-up or nail polish, including gel polish,  artificial nails, or any other type of covering on natural nails (fingers and  toes).  Do not wear lotions, powders, or perfumes, or deodorant.  Do not shave 48 hours prior to surgery.  Men may shave face and neck.  Do not bring valuables to the hospital.  [facility] is not responsible for any belongings or valuables.  Contacts, dentures or bridgework may not be worn into surgery.  Leave your suitcase in the car.  After surgery it may be brought to your room.  For patients admitted to the hospital,  discharge time will be determined by your treatment team.  Patients discharged the day of surgery will not be allowed to drive home and must have someone with them for 24 hours.    Special instructions:   DO NOT smoke tobacco or vape for 24 hours before your procedure.  Please read over the following fact sheets that you were given. Anesthesia Post-op Instructions and Care and Recovery After Surgery      Flexible Sigmoidoscopy, Care After The following information offers guidance on how to care for yourself after your procedure. Your health care provider may also give you more specific instructions. If you have problems or questions, contact your health care provider. What can I expect after the procedure? After the procedure, it is common to have: Cramping or pain in your abdomen. Bloating. A small amount of blood with your stool. This may happen if a sample of tissue was removed for testing (biopsy). Follow these instructions at home: Eating and drinking Drink enough fluid to keep your urine pale yellow. Follow instructions from your health care provider about what you may eat and drink. Go back to your normal diet as told by your health care provider. Avoid heavy or fried foods. These may be hard to digest. Activity  If you were given a sedative during the  procedure, it can affect you for several hours. Do not drive or operate machinery until your health care provider says that it is safe. Rest as told by your health care provider. Do not sit for a long time without moving. Get up to take short walks every 1-2 hours. This will improve blood flow and breathing. Ask for help if you feel weak or unsteady. Return to your normal activities as told by your health care provider. Ask your health care provider what activities are safe for you. General instructions Take over-the-counter and prescription medicines only as told by your health care provider. Try walking around when you have  cramps or feel bloated. Contact a health care provider if: You have pain or cramping in your abdomen that gets worse or is not helped with medicine. You have a small amount of bleeding from your rectum for more than 24 hours after your procedure. You have nausea or vomiting. You feel weak or dizzy. You have a fever. Get help right away if: You pass large blood clots or see a large amount of blood in the toilet after having a bowel movement. You have severe pain in your abdomen. This information is not intended to replace advice given to you by your health care provider. Make sure you discuss any questions you have with your health care provider. Document Revised: 08/24/2021 Document Reviewed: 08/24/2021 Elsevier Patient Education  2024 Elsevier Inc. General Anesthesia, Adult, Care After The following information offers guidance on how to care for yourself after your procedure. Your health care provider may also give you more specific instructions. If you have problems or questions, contact your health care provider. What can I expect after the procedure? After the procedure, it is common for people to: Have pain or discomfort at the IV site. Have nausea or vomiting. Have a sore throat or hoarseness. Have trouble concentrating. Feel cold or chills. Feel weak, sleepy, or tired (fatigue). Have soreness and body aches. These can affect parts of the body that were not involved in surgery. Follow these instructions at home: For the time period you were told by your health care provider:  Rest. Do not participate in activities where you could fall or become injured. Do not drive or use machinery. Do not drink alcohol. Do not take sleeping pills or medicines that cause drowsiness. Do not make important decisions or sign legal documents. Do not take care of children on your own. General instructions Drink enough fluid to keep your urine pale yellow. If you have sleep apnea, surgery and  certain medicines can increase your risk for breathing problems. Follow instructions from your health care provider about wearing your sleep device: Anytime you are sleeping, including during daytime naps. While taking prescription pain medicines, sleeping medicines, or medicines that make you drowsy. Return to your normal activities as told by your health care provider. Ask your health care provider what activities are safe for you. Take over-the-counter and prescription medicines only as told by your health care provider. Do not use any products that contain nicotine or tobacco. These products include cigarettes, chewing tobacco, and vaping devices, such as e-cigarettes. These can delay incision healing after surgery. If you need help quitting, ask your health care provider. Contact a health care provider if: You have nausea or vomiting that does not get better with medicine. You vomit every time you eat or drink. You have pain that does not get better with medicine. You cannot urinate or have bloody urine. You develop  a skin rash. You have a fever. Get help right away if: You have trouble breathing. You have chest pain. You vomit blood. These symptoms may be an emergency. Get help right away. Call 911. Do not wait to see if the symptoms will go away. Do not drive yourself to the hospital. Summary After the procedure, it is common to have a sore throat, hoarseness, nausea, vomiting, or to feel weak, sleepy, or fatigue. For the time period you were told by your health care provider, do not drive or use machinery. Get help right away if you have difficulty breathing, have chest pain, or vomit blood. These symptoms may be an emergency. This information is not intended to replace advice given to you by your health care provider. Make sure you discuss any questions you have with your health care provider. Document Revised: 06/17/2021 Document Reviewed: 06/17/2021 Elsevier Patient Education   2024 [employer].

## 2023-07-05 ENCOUNTER — Encounter (HOSPITAL_COMMUNITY)
Admission: RE | Admit: 2023-07-05 | Discharge: 2023-07-05 | Disposition: A | Discharge status: Home / Self Care | Source: Ambulatory Visit | Attending: Internal Medicine | Admitting: Internal Medicine

## 2023-07-05 VITALS — BP 105/70 | HR 100 | Temp 97.6°F | Resp 18 | Ht 64.0 in | Wt 209.6 lb

## 2023-07-05 DIAGNOSIS — E876 Hypokalemia: Secondary | ICD-10-CM | POA: Insufficient documentation

## 2023-07-05 DIAGNOSIS — E871 Hypo-osmolality and hyponatremia: Secondary | ICD-10-CM | POA: Insufficient documentation

## 2023-07-05 DIAGNOSIS — E1165 Type 2 diabetes mellitus with hyperglycemia: Secondary | ICD-10-CM | POA: Diagnosis not present

## 2023-07-05 DIAGNOSIS — Z01812 Encounter for preprocedural laboratory examination: Secondary | ICD-10-CM | POA: Insufficient documentation

## 2023-07-05 LAB — BASIC METABOLIC PANEL WITH GFR
Anion gap: 13 (ref 5–15)
BUN: 11 mg/dL (ref 6–20)
CO2: 21 mmol/L — ABNORMAL LOW (ref 22–32)
Calcium: 9.2 mg/dL (ref 8.9–10.3)
Chloride: 103 mmol/L (ref 98–111)
Creatinine, Ser: 0.89 mg/dL (ref 0.44–1.00)
GFR, Estimated: >60 mL/min (ref >60)
Glucose, Bld: 88 mg/dL (ref 70–99)
Potassium: 3.6 mmol/L (ref 3.5–5.1)
Sodium: 137 mmol/L (ref 135–145)

## 2023-07-06 DIAGNOSIS — N3941 Urge incontinence: Secondary | ICD-10-CM | POA: Diagnosis not present

## 2023-07-09 ENCOUNTER — Other Ambulatory Visit: Payer: Self-pay

## 2023-07-09 ENCOUNTER — Encounter (HOSPITAL_COMMUNITY): Payer: Self-pay | Admitting: Internal Medicine

## 2023-07-09 ENCOUNTER — Encounter (HOSPITAL_COMMUNITY)
Admission: RE | Disposition: A | Discharge status: Home / Self Care | Payer: Self-pay | Source: Home / Self Care | Attending: Internal Medicine

## 2023-07-09 ENCOUNTER — Ambulatory Visit (HOSPITAL_COMMUNITY): Admitting: Anesthesiology

## 2023-07-09 ENCOUNTER — Ambulatory Visit (HOSPITAL_COMMUNITY)
Admission: RE | Admit: 2023-07-09 | Discharge: 2023-07-09 | Disposition: A | Discharge status: Home / Self Care | Attending: Internal Medicine | Admitting: Internal Medicine

## 2023-07-09 DIAGNOSIS — K648 Other hemorrhoids: Secondary | ICD-10-CM | POA: Insufficient documentation

## 2023-07-09 DIAGNOSIS — K6389 Other specified diseases of intestine: Secondary | ICD-10-CM | POA: Diagnosis not present

## 2023-07-09 DIAGNOSIS — Z79899 Other long term (current) drug therapy: Secondary | ICD-10-CM | POA: Diagnosis not present

## 2023-07-09 DIAGNOSIS — F4310 Post-traumatic stress disorder, unspecified: Secondary | ICD-10-CM | POA: Insufficient documentation

## 2023-07-09 DIAGNOSIS — F418 Other specified anxiety disorders: Secondary | ICD-10-CM | POA: Diagnosis not present

## 2023-07-09 DIAGNOSIS — K219 Gastro-esophageal reflux disease without esophagitis: Secondary | ICD-10-CM | POA: Insufficient documentation

## 2023-07-09 DIAGNOSIS — J449 Chronic obstructive pulmonary disease, unspecified: Secondary | ICD-10-CM

## 2023-07-09 DIAGNOSIS — F17210 Nicotine dependence, cigarettes, uncomplicated: Secondary | ICD-10-CM

## 2023-07-09 DIAGNOSIS — Z7984 Long term (current) use of oral hypoglycemic drugs: Secondary | ICD-10-CM | POA: Insufficient documentation

## 2023-07-09 DIAGNOSIS — Z860101 Personal history of adenomatous and serrated colon polyps: Secondary | ICD-10-CM | POA: Diagnosis not present

## 2023-07-09 DIAGNOSIS — I10 Essential (primary) hypertension: Secondary | ICD-10-CM | POA: Diagnosis not present

## 2023-07-09 DIAGNOSIS — K529 Noninfective gastroenteritis and colitis, unspecified: Secondary | ICD-10-CM | POA: Insufficient documentation

## 2023-07-09 DIAGNOSIS — Z980 Intestinal bypass and anastomosis status: Secondary | ICD-10-CM | POA: Insufficient documentation

## 2023-07-09 DIAGNOSIS — G4730 Sleep apnea, unspecified: Secondary | ICD-10-CM | POA: Insufficient documentation

## 2023-07-09 DIAGNOSIS — Z1211 Encounter for screening for malignant neoplasm of colon: Secondary | ICD-10-CM | POA: Diagnosis not present

## 2023-07-09 DIAGNOSIS — E1165 Type 2 diabetes mellitus with hyperglycemia: Secondary | ICD-10-CM

## 2023-07-09 DIAGNOSIS — G709 Myoneural disorder, unspecified: Secondary | ICD-10-CM | POA: Diagnosis not present

## 2023-07-09 DIAGNOSIS — J4489 Other specified chronic obstructive pulmonary disease: Secondary | ICD-10-CM | POA: Diagnosis not present

## 2023-07-09 DIAGNOSIS — Z9049 Acquired absence of other specified parts of digestive tract: Secondary | ICD-10-CM | POA: Insufficient documentation

## 2023-07-09 DIAGNOSIS — E119 Type 2 diabetes mellitus without complications: Secondary | ICD-10-CM | POA: Insufficient documentation

## 2023-07-09 DIAGNOSIS — M797 Fibromyalgia: Secondary | ICD-10-CM | POA: Insufficient documentation

## 2023-07-09 DIAGNOSIS — M1990 Unspecified osteoarthritis, unspecified site: Secondary | ICD-10-CM | POA: Diagnosis not present

## 2023-07-09 HISTORY — PX: FLEXIBLE SIGMOIDOSCOPY: SHX5431

## 2023-07-09 LAB — GLUCOSE, CAPILLARY: Glucose-Capillary: 119 mg/dL — ABNORMAL HIGH (ref 70–99)

## 2023-07-09 SURGERY — SIGMOIDOSCOPY, FLEXIBLE
Anesthesia: General

## 2023-07-09 MED ORDER — PROPOFOL 500 MG/50ML IV EMUL
INTRAVENOUS | Status: DC | PRN
  Administered 2023-07-09: 150 ug/kg/min via INTRAVENOUS

## 2023-07-09 MED ORDER — PHENYLEPHRINE 80 MCG/ML (10ML) SYRINGE FOR IV PUSH (FOR BLOOD PRESSURE SUPPORT)
PREFILLED_SYRINGE | INTRAVENOUS | Status: AC
  Filled 2023-07-09: qty 10

## 2023-07-09 MED ORDER — LIDOCAINE HCL (PF) 2 % IJ SOLN
INTRAMUSCULAR | Status: DC | PRN
  Administered 2023-07-09: 60 mg via INTRADERMAL

## 2023-07-09 MED ORDER — PROPOFOL 10 MG/ML IV BOLUS
INTRAVENOUS | Status: DC | PRN
  Administered 2023-07-09: 70 mg via INTRAVENOUS

## 2023-07-09 MED ORDER — LACTATED RINGERS IV SOLN: INTRAVENOUS | Status: DC | PRN

## 2023-07-09 MED ORDER — LIDOCAINE HCL (PF) 2 % IJ SOLN
INTRAMUSCULAR | Status: AC
  Filled 2023-07-09: qty 5

## 2023-07-09 MED ORDER — PHENYLEPHRINE 80 MCG/ML (10ML) SYRINGE FOR IV PUSH (FOR BLOOD PRESSURE SUPPORT)
PREFILLED_SYRINGE | INTRAVENOUS | Status: DC | PRN
  Administered 2023-07-09: 80 ug via INTRAVENOUS
  Administered 2023-07-09: 160 ug via INTRAVENOUS

## 2023-07-09 NOTE — Interval H&P Note (Signed)
 History and Physical Interval Note:  07/09/2023 8:51 AM  Alexis Harris  has presented today for surgery, with the diagnosis of hx of adenomatous polyps.  The various methods of treatment have been discussed with the patient and family. After consideration of risks, benefits and other options for treatment, the patient has consented to  Procedure(s) with comments: SIGMOIDOSCOPY, FLEXIBLE (N/A) - 800am, asa 3 as a surgical intervention.  The patient's history has been reviewed, patient examined, no change in status, stable for surgery.  I have reviewed the patient's chart and labs.  Questions were answered to the patient's satisfaction.     Lanelle Bal

## 2023-07-09 NOTE — Op Note (Signed)
 [facility] Patient Name: Alexis Harris Procedure Date: 07/09/2023 8:45 AM MRN: [id number] Date of Birth: Nov 06, 1970 Attending MD: Hennie Duos. Marletta Lor , [place], [id number] CSN: [id number] Age: 53 Admit Type: Outpatient Procedure:                Flexible Sigmoidoscopy Indications:              High risk colon cancer surveillance: Personal                            history of adenoma (10 mm or greater in size), High                            risk colon cancer surveillance: Personal history of                            multiple (3 or more) adenomas Providers:                Hennie Duos. Marletta Lor, DO, Elinor Parkinson, Angela A.                            Thomasena Edis RN, RN Referring MD:              Medicines:                See the Anesthesia note for documentation of the                            administered medications Complications:            No immediate complications. Estimated Blood Loss:     Estimated blood loss: none. Procedure:                Pre-Anesthesia Assessment:                           - The anesthesia plan was to use monitored                            anesthesia care (MAC).                           After obtaining informed consent, the scope was                            passed under direct vision. The PCF-[id number]                            ([id number]) scope was introduced through the anus and                            advanced to the the ileo-sigmoid anastomosis. The                            flexible sigmoidoscopy was accomplished without                            difficulty. The  patient tolerated the procedure                            well. The quality of the bowel preparation was good. Scope In: 9:03:33 AM Scope Out: 9:10:49 AM Total Procedure Duration: 0 hours 7 minutes 16 seconds  Findings:      Non-bleeding internal hemorrhoids were found.      There was evidence of a prior side-to-side ileo-colonic anastomosis in       the sigmoid colon. This was patent  and was characterized by inflammation       and ulceration. The anastomosis was traversed.      The ileum appeared normal. Impression:               - Non-bleeding internal hemorrhoids.                           - Patent side-to-side ileo-colonic anastomosis,                            characterized by inflammation.                           - The terminal ileum is normal.                           - No specimens collected. Moderate Sedation:      Per Anesthesia Care Recommendation:           - Patient has a contact number available for                            emergencies. The signs and symptoms of potential                            delayed complications were discussed with the                            patient. Return to normal activities tomorrow.                            Written discharge instructions were provided to the                            patient.                           - Resume previous diet.                           - Continue present medications.                           - Repeat flexible sigmoidoscopy in 5 years for                            surveillance.                           -  Return to GI clinic in 6 months. Procedure Code(s):        --- Professional ---                           Z6109, Colorectal cancer screening; flexible                            sigmoidoscopy Diagnosis Code(s):        --- Professional ---                           Z98.0, Intestinal bypass and anastomosis status                           K64.8, Other hemorrhoids                           Z86.010, Personal history of colonic polyps CPT copyright 2022 [employer]. All rights reserved. The codes documented in this report are preliminary and upon coder review may  be revised to meet current compliance requirements. Hennie Duos. Marletta Lor, DO Hennie Duos. Marletta Lor, DO 07/09/2023 9:15:35 AM This report has been signed electronically. Number of Addenda: 0

## 2023-07-09 NOTE — Transfer of Care (Addendum)
 Immediate Anesthesia Transfer of Care Note  Patient: Kamorah Nevils Broxterman  Procedure(s) Performed: Arnell Sieving  Patient Location: Short Stay  Anesthesia Type:General  Level of Consciousness: drowsy and patient cooperative  Airway & Oxygen Therapy: Patient Spontanous Breathing  Post-op Assessment: Report given to RN and Post -op Vital signs reviewed and stable  Post vital signs: Reviewed and stable  Last Vitals:  Vitals Value Taken Time  BP 130/67 07/09/23   0917  Temp 36.4 C 07/09/23 0917  Pulse 72 07/09/23 0917  Resp 17 07/09/23 0917  SpO2 100 % 07/09/23 0917    Last Pain:  Vitals:   07/09/23 0917  TempSrc: Axillary  PainSc: 0-No pain         Complications: No notable events documented.

## 2023-07-09 NOTE — Discharge Instructions (Signed)
  Flexible Sigmoidoscopy Discharge Instructions  Read the instructions outlined below and refer to this sheet in the next few weeks. These discharge instructions provide you with general information on caring for yourself after you leave the hospital. Your doctor may also give you specific instructions. While your treatment has been planned according to the most current medical practices available, unavoidable complications occasionally occur.   ACTIVITY You may resume your regular activity, but move at a slower pace for the next 24 hours.  Take frequent rest periods for the next 24 hours.  Walking will help get rid of the air and reduce the bloated feeling in your belly (abdomen).  No driving for 24 hours (because of the medicine (anesthesia) used during the test).   Do not sign any important legal documents or operate any machinery for 24 hours (because of the anesthesia used during the test).  NUTRITION Drink plenty of fluids.  You may resume your normal diet as instructed by your doctor.  Begin with a light meal and progress to your normal diet. Heavy or fried foods are harder to digest and may make you feel sick to your stomach (nauseated).  Avoid alcoholic beverages for 24 hours or as instructed.  MEDICATIONS You may resume your normal medications unless your doctor tells you otherwise.  WHAT YOU CAN EXPECT TODAY Some feelings of bloating in the abdomen.  Passage of more gas than usual.  Spotting of blood in your stool or on the toilet paper.  IF YOU HAD POLYPS REMOVED DURING THE COLONOSCOPY: No aspirin products for 7 days or as instructed.  No alcohol for 7 days or as instructed.  Eat a soft diet for the next 24 hours.  FINDING OUT THE RESULTS OF YOUR TEST Not all test results are available during your visit. If your test results are not back during the visit, make an appointment with your caregiver to find out the results. Do not assume everything is normal if you have not heard  from your caregiver or the medical facility. It is important for you to follow up on all of your test results.  SEEK IMMEDIATE MEDICAL ATTENTION IF: You have more than a spotting of blood in your stool.  Your belly is swollen (abdominal distention).  You are nauseated or vomiting.  You have a temperature over 101.  You have abdominal pain or discomfort that is severe or gets worse throughout the day.   Overall, everything looked good today. Slight inflammation at the anastomosis which is fairly common.  Try to limit NSAID use as best as you can.  No evidence of polyps or cancer.  Recommend repeat examination in 5 years.  Follow-up in GI office in 6 months.  I hope you have a great rest of your week!  Hennie Duos. Marletta Lor, D.O. Gastroenterology and Hepatology [facility]

## 2023-07-09 NOTE — Anesthesia Preprocedure Evaluation (Addendum)
 Anesthesia Evaluation  Patient identified by MRN, date of birth, ID band Patient awake    Reviewed: Allergy & Precautions, H&P , NPO status , Patient's Chart, lab work & pertinent test results, reviewed documented beta blocker date and time   History of Anesthesia Complications (+) PONV and history of anesthetic complications  Airway Mallampati: II  TM Distance: >3 FB Neck ROM: full    Dental  (+) Edentulous Upper, Edentulous Lower   Pulmonary asthma , sleep apnea , COPD, Current Smoker   Pulmonary exam normal breath sounds clear to auscultation       Cardiovascular Exercise Tolerance: Good hypertension, Normal cardiovascular exam Rhythm:regular Rate:Normal     Neuro/Psych  Headaches PSYCHIATRIC DISORDERS Anxiety Depression    PTSD Neuromuscular disease    GI/Hepatic negative GI ROS, Neg liver ROS,GERD  ,,  Endo/Other  diabetes, Type 2    Renal/GU negative Renal ROS  negative genitourinary   Musculoskeletal  (+) Arthritis , Osteoarthritis,  Fibromyalgia -  Abdominal   Peds  Hematology negative hematology ROS (+)   Anesthesia Other Findings   Reproductive/Obstetrics negative OB ROS                             Anesthesia Physical Anesthesia Plan  ASA: 3  Anesthesia Plan: General   Post-op Pain Management: Minimal or no pain anticipated   Induction: Intravenous  PONV Risk Score and Plan: Propofol infusion  Airway Management Planned: Nasal Cannula and Natural Airway  Additional Equipment: None  Intra-op Plan:   Post-operative Plan:   Informed Consent: I have reviewed the patients History and Physical, chart, labs and discussed the procedure including the risks, benefits and alternatives for the proposed anesthesia with the patient or authorized representative who has indicated his/her understanding and acceptance.     Dental Advisory Given  Plan Discussed with:  CRNA  Anesthesia Plan Comments:        Anesthesia Quick Evaluation

## 2023-07-09 NOTE — Anesthesia Postprocedure Evaluation (Signed)
 Anesthesia Post Note  Patient: Alexis Harris  Procedure(s) Performed: Arnell Sieving  Patient location during evaluation: PACU Anesthesia Type: General Level of consciousness: awake and alert Pain management: pain level controlled Vital Signs Assessment: post-procedure vital signs reviewed and stable Respiratory status: spontaneous breathing, nonlabored ventilation, respiratory function stable and patient connected to nasal cannula oxygen Cardiovascular status: blood pressure returned to baseline and stable Postop Assessment: no apparent nausea or vomiting Anesthetic complications: no   There were no known notable events for this encounter.   Last Vitals:  Vitals:   07/09/23 0752 07/09/23 0917  BP: 122/68 130/67  Pulse: 80 72  Resp: 18 17  Temp: 36.5 C 36.4 C  SpO2: 97% 100%    Last Pain:  Vitals:   07/09/23 0917  TempSrc: Axillary  PainSc: 0-No pain                 Zoria Rawlinson L Micheal Sheen

## 2023-07-10 ENCOUNTER — Encounter (HOSPITAL_COMMUNITY): Payer: Self-pay | Admitting: Internal Medicine

## 2023-07-11 DIAGNOSIS — R062 Wheezing: Secondary | ICD-10-CM | POA: Diagnosis not present

## 2023-07-11 DIAGNOSIS — F17200 Nicotine dependence, unspecified, uncomplicated: Secondary | ICD-10-CM | POA: Diagnosis not present

## 2023-07-11 DIAGNOSIS — J069 Acute upper respiratory infection, unspecified: Secondary | ICD-10-CM | POA: Diagnosis not present

## 2023-07-17 ENCOUNTER — Ambulatory Visit (HOSPITAL_COMMUNITY): Payer: Medicare PPO

## 2023-07-31 ENCOUNTER — Ambulatory Visit (HOSPITAL_COMMUNITY)
Admission: RE | Admit: 2023-07-31 | Discharge: 2023-07-31 | Disposition: A | Discharge status: Home / Self Care | Source: Ambulatory Visit | Attending: Oncology | Admitting: Oncology

## 2023-07-31 DIAGNOSIS — F17210 Nicotine dependence, cigarettes, uncomplicated: Secondary | ICD-10-CM | POA: Diagnosis not present

## 2023-07-31 DIAGNOSIS — Z87891 Personal history of nicotine dependence: Secondary | ICD-10-CM | POA: Diagnosis not present

## 2023-07-31 DIAGNOSIS — Z122 Encounter for screening for malignant neoplasm of respiratory organs: Secondary | ICD-10-CM | POA: Insufficient documentation

## 2023-08-16 DIAGNOSIS — F411 Generalized anxiety disorder: Secondary | ICD-10-CM | POA: Diagnosis not present

## 2023-08-16 DIAGNOSIS — F17210 Nicotine dependence, cigarettes, uncomplicated: Secondary | ICD-10-CM | POA: Diagnosis not present

## 2023-08-16 DIAGNOSIS — F331 Major depressive disorder, recurrent, moderate: Secondary | ICD-10-CM | POA: Diagnosis not present

## 2023-08-16 DIAGNOSIS — F429 Obsessive-compulsive disorder, unspecified: Secondary | ICD-10-CM | POA: Diagnosis not present

## 2023-08-21 DIAGNOSIS — H43822 Vitreomacular adhesion, left eye: Secondary | ICD-10-CM | POA: Diagnosis not present

## 2023-08-21 DIAGNOSIS — H35383 Toxic maculopathy, bilateral: Secondary | ICD-10-CM | POA: Diagnosis not present

## 2023-08-21 DIAGNOSIS — E119 Type 2 diabetes mellitus without complications: Secondary | ICD-10-CM | POA: Diagnosis not present

## 2023-08-21 DIAGNOSIS — H353132 Nonexudative age-related macular degeneration, bilateral, intermediate dry stage: Secondary | ICD-10-CM | POA: Diagnosis not present

## 2023-08-21 DIAGNOSIS — H43813 Vitreous degeneration, bilateral: Secondary | ICD-10-CM | POA: Diagnosis not present

## 2023-09-03 ENCOUNTER — Other Ambulatory Visit: Payer: Self-pay | Admitting: Gastroenterology

## 2023-09-03 DIAGNOSIS — K219 Gastro-esophageal reflux disease without esophagitis: Secondary | ICD-10-CM

## 2023-10-01 ENCOUNTER — Encounter: Payer: Self-pay | Admitting: *Deleted

## 2023-10-01 NOTE — Progress Notes (Signed)
 Patient notified via mail of LDCT lung cancer screening results with recommendations to follow up in 12 months.  Also notified of incidental findings and need to follow up with PCP.  Patient's referring provider was sent a copy of results.     IMPRESSION: Lung-RADS 1, negative. Continue annual screening with low-dose chest CT without contrast in 12 months.   Aortic Atherosclerosis (ICD10-I70.0) and Emphysema (ICD10-J43.9).

## 2023-10-03 ENCOUNTER — Other Ambulatory Visit: Payer: Self-pay | Admitting: Gastroenterology

## 2023-10-03 DIAGNOSIS — R197 Diarrhea, unspecified: Secondary | ICD-10-CM

## 2023-10-08 ENCOUNTER — Other Ambulatory Visit: Payer: Self-pay | Admitting: Gastroenterology

## 2023-10-08 DIAGNOSIS — R197 Diarrhea, unspecified: Secondary | ICD-10-CM

## 2023-10-10 DIAGNOSIS — F411 Generalized anxiety disorder: Secondary | ICD-10-CM | POA: Diagnosis not present

## 2023-10-10 DIAGNOSIS — Z79899 Other long term (current) drug therapy: Secondary | ICD-10-CM | POA: Diagnosis not present

## 2023-10-10 DIAGNOSIS — F1320 Sedative, hypnotic or anxiolytic dependence, uncomplicated: Secondary | ICD-10-CM | POA: Diagnosis not present

## 2023-10-10 DIAGNOSIS — F17210 Nicotine dependence, cigarettes, uncomplicated: Secondary | ICD-10-CM | POA: Diagnosis not present

## 2023-10-10 DIAGNOSIS — Z5181 Encounter for therapeutic drug level monitoring: Secondary | ICD-10-CM | POA: Diagnosis not present

## 2023-10-10 DIAGNOSIS — F429 Obsessive-compulsive disorder, unspecified: Secondary | ICD-10-CM | POA: Diagnosis not present

## 2023-10-10 DIAGNOSIS — F331 Major depressive disorder, recurrent, moderate: Secondary | ICD-10-CM | POA: Diagnosis not present

## 2023-10-11 DIAGNOSIS — N3941 Urge incontinence: Secondary | ICD-10-CM | POA: Diagnosis not present

## 2023-10-11 DIAGNOSIS — R300 Dysuria: Secondary | ICD-10-CM | POA: Diagnosis not present

## 2023-11-09 ENCOUNTER — Other Ambulatory Visit: Payer: Self-pay | Admitting: Gastroenterology

## 2023-11-09 DIAGNOSIS — K219 Gastro-esophageal reflux disease without esophagitis: Secondary | ICD-10-CM

## 2023-11-14 ENCOUNTER — Other Ambulatory Visit: Payer: Self-pay | Admitting: Gastroenterology

## 2023-11-14 ENCOUNTER — Telehealth: Payer: Self-pay | Admitting: *Deleted

## 2023-11-14 DIAGNOSIS — K219 Gastro-esophageal reflux disease without esophagitis: Secondary | ICD-10-CM

## 2023-11-14 MED ORDER — FAMOTIDINE 20 MG PO TABS: 20.0000 mg | ORAL_TABLET | Freq: Every day | ORAL | 2 refills | Status: DC

## 2023-11-14 NOTE — Telephone Encounter (Signed)
 Pt called and needs a refill on famotidine . Pt last OV 06/18/2023

## 2023-11-14 NOTE — Telephone Encounter (Signed)
 Rx sent.

## 2023-12-05 DIAGNOSIS — F331 Major depressive disorder, recurrent, moderate: Secondary | ICD-10-CM | POA: Diagnosis not present

## 2023-12-05 DIAGNOSIS — F411 Generalized anxiety disorder: Secondary | ICD-10-CM | POA: Diagnosis not present

## 2023-12-05 DIAGNOSIS — F17210 Nicotine dependence, cigarettes, uncomplicated: Secondary | ICD-10-CM | POA: Diagnosis not present

## 2023-12-05 DIAGNOSIS — F429 Obsessive-compulsive disorder, unspecified: Secondary | ICD-10-CM | POA: Diagnosis not present

## 2023-12-10 ENCOUNTER — Other Ambulatory Visit (HOSPITAL_COMMUNITY): Payer: Self-pay | Admitting: Internal Medicine

## 2023-12-10 DIAGNOSIS — Z1231 Encounter for screening mammogram for malignant neoplasm of breast: Secondary | ICD-10-CM

## 2023-12-12 DIAGNOSIS — F339 Major depressive disorder, recurrent, unspecified: Secondary | ICD-10-CM | POA: Diagnosis not present

## 2023-12-12 DIAGNOSIS — E1165 Type 2 diabetes mellitus with hyperglycemia: Secondary | ICD-10-CM | POA: Diagnosis not present

## 2023-12-12 DIAGNOSIS — J449 Chronic obstructive pulmonary disease, unspecified: Secondary | ICD-10-CM | POA: Diagnosis not present

## 2023-12-12 DIAGNOSIS — Z23 Encounter for immunization: Secondary | ICD-10-CM | POA: Diagnosis not present

## 2023-12-12 DIAGNOSIS — F17200 Nicotine dependence, unspecified, uncomplicated: Secondary | ICD-10-CM | POA: Diagnosis not present

## 2023-12-12 DIAGNOSIS — Z113 Encounter for screening for infections with a predominantly sexual mode of transmission: Secondary | ICD-10-CM | POA: Diagnosis not present

## 2023-12-12 DIAGNOSIS — K219 Gastro-esophageal reflux disease without esophagitis: Secondary | ICD-10-CM | POA: Diagnosis not present

## 2023-12-12 DIAGNOSIS — I10 Essential (primary) hypertension: Secondary | ICD-10-CM | POA: Diagnosis not present

## 2023-12-13 ENCOUNTER — Encounter: Payer: Self-pay | Admitting: Internal Medicine

## 2024-01-05 ENCOUNTER — Other Ambulatory Visit: Payer: Self-pay | Admitting: Gastroenterology

## 2024-01-05 DIAGNOSIS — R197 Diarrhea, unspecified: Secondary | ICD-10-CM

## 2024-01-09 DIAGNOSIS — J988 Other specified respiratory disorders: Secondary | ICD-10-CM | POA: Diagnosis not present

## 2024-01-09 DIAGNOSIS — F17200 Nicotine dependence, unspecified, uncomplicated: Secondary | ICD-10-CM | POA: Diagnosis not present

## 2024-01-11 DIAGNOSIS — J449 Chronic obstructive pulmonary disease, unspecified: Secondary | ICD-10-CM | POA: Diagnosis not present

## 2024-01-11 DIAGNOSIS — E1165 Type 2 diabetes mellitus with hyperglycemia: Secondary | ICD-10-CM | POA: Diagnosis not present

## 2024-01-22 ENCOUNTER — Ambulatory Visit: Admitting: Gastroenterology

## 2024-01-25 ENCOUNTER — Ambulatory Visit (HOSPITAL_COMMUNITY)
Admission: RE | Admit: 2024-01-25 | Discharge: 2024-01-25 | Disposition: A | Discharge status: Home / Self Care | Source: Ambulatory Visit | Attending: Internal Medicine | Admitting: Internal Medicine

## 2024-01-25 DIAGNOSIS — Z1231 Encounter for screening mammogram for malignant neoplasm of breast: Secondary | ICD-10-CM | POA: Insufficient documentation

## 2024-01-30 DIAGNOSIS — F331 Major depressive disorder, recurrent, moderate: Secondary | ICD-10-CM | POA: Diagnosis not present

## 2024-01-30 DIAGNOSIS — F429 Obsessive-compulsive disorder, unspecified: Secondary | ICD-10-CM | POA: Diagnosis not present

## 2024-01-30 DIAGNOSIS — F17210 Nicotine dependence, cigarettes, uncomplicated: Secondary | ICD-10-CM | POA: Diagnosis not present

## 2024-01-30 DIAGNOSIS — F411 Generalized anxiety disorder: Secondary | ICD-10-CM | POA: Diagnosis not present

## 2024-01-31 DIAGNOSIS — F17200 Nicotine dependence, unspecified, uncomplicated: Secondary | ICD-10-CM | POA: Diagnosis not present

## 2024-01-31 DIAGNOSIS — F17210 Nicotine dependence, cigarettes, uncomplicated: Secondary | ICD-10-CM | POA: Diagnosis not present

## 2024-02-06 ENCOUNTER — Ambulatory Visit: Admitting: Gastroenterology

## 2024-02-11 DIAGNOSIS — J449 Chronic obstructive pulmonary disease, unspecified: Secondary | ICD-10-CM | POA: Diagnosis not present

## 2024-02-11 DIAGNOSIS — E1165 Type 2 diabetes mellitus with hyperglycemia: Secondary | ICD-10-CM | POA: Diagnosis not present

## 2024-02-12 DIAGNOSIS — N3010 Interstitial cystitis (chronic) without hematuria: Secondary | ICD-10-CM | POA: Diagnosis not present

## 2024-02-21 DIAGNOSIS — F429 Obsessive-compulsive disorder, unspecified: Secondary | ICD-10-CM | POA: Diagnosis not present

## 2024-02-21 DIAGNOSIS — F17210 Nicotine dependence, cigarettes, uncomplicated: Secondary | ICD-10-CM | POA: Diagnosis not present

## 2024-02-21 DIAGNOSIS — F411 Generalized anxiety disorder: Secondary | ICD-10-CM | POA: Diagnosis not present

## 2024-02-21 DIAGNOSIS — F331 Major depressive disorder, recurrent, moderate: Secondary | ICD-10-CM | POA: Diagnosis not present

## 2024-03-14 ENCOUNTER — Ambulatory Visit: Admitting: Gastroenterology

## 2024-03-14 ENCOUNTER — Encounter: Payer: Self-pay | Admitting: Gastroenterology

## 2024-03-14 DIAGNOSIS — R143 Flatulence: Secondary | ICD-10-CM | POA: Diagnosis not present

## 2024-03-14 DIAGNOSIS — K219 Gastro-esophageal reflux disease without esophagitis: Secondary | ICD-10-CM

## 2024-03-14 DIAGNOSIS — R197 Diarrhea, unspecified: Secondary | ICD-10-CM

## 2024-03-14 DIAGNOSIS — K529 Noninfective gastroenteritis and colitis, unspecified: Secondary | ICD-10-CM

## 2024-03-14 MED ORDER — LOPERAMIDE HCL 2 MG PO CAPS: 2.0000 mg | ORAL_CAPSULE | Freq: Two times a day (BID) | ORAL | 11 refills | Status: AC

## 2024-03-14 MED ORDER — PANTOPRAZOLE SODIUM 40 MG PO TBEC: 40.0000 mg | DELAYED_RELEASE_TABLET | Freq: Two times a day (BID) | ORAL | 3 refills | Status: AC

## 2024-03-14 MED ORDER — FAMOTIDINE 20 MG PO TABS: 20.0000 mg | ORAL_TABLET | Freq: Every day | ORAL | 3 refills | Status: AC

## 2024-03-14 MED ORDER — DIPHENOXYLATE-ATROPINE 2.5-0.025 MG PO TABS: ORAL_TABLET | ORAL | 5 refills | Status: AC

## 2024-03-14 NOTE — Progress Notes (Unsigned)
 GI Office Note    Referring Provider: Carlette Benita Area, MD Primary Care Physician:  Fanta, Tesfaye Demissie, MD  Primary Gastroenterologist: Carlin POUR. Cindie, DO   Chief Complaint   Chief Complaint  Patient presents with   Follow-up    Follow up. Medication refill     History of Present Illness   Alexis Harris is a 53 y.o. female presenting today for follow up. Last seen in 06/2023. History of GERD, numerous large, greater than 3 cm, polyps in the colon, adenomatous colon polyps, s/p near total colectomy 04/10/2022 with ileocolonic anastomosis (rectosigmoid), multiple tubular adenomas on pathology,   Discussed the use of AI scribe software for clinical note transcription with the patient, who gave verbal consent to proceed.  History of Present Illness Alexis Harris is a 53 year old female who presents for medication refills and management of gastrointestinal symptoms.  Her heartburn is well controlled on pantoprazole  twice daily and evening famotidine , with only occasional breakthrough.  She uses Lomotil  once or twice daily and Imodium  for diarrhea, spacing doses to avoid interaction with topiramate . She has one mushy bowel movement per day, sometimes darker, without blood or constipation.  She has increased flatulence without pain or dietary change and recently started Gas-X. She feels gassy at times but denies foul odor.  She has lost about 10-12 pounds since her last visit while trying to watch her diet and meal timing. She drinks some water but mainly carbonated beverages such as diet Pepsi, without recent change in intake.   Wt Readings from Last 3 Encounters:  03/14/24 197 lb 12.8 oz (89.7 kg)  07/09/23 209 lb 10.5 oz (95.1 kg)  07/05/23 209 lb 9.6 oz (95.1 kg)     Prior Data     Results    Flexible sigmoidoscopy 04/23/2023: Nonbleeding internal hemorrhoids, patent side-to-side ileocolonic anastomosis with healthy-appearing mucosa, patchy moderate  inflammation in the small bowel biopsied.  Noted exam was incomplete overall due to poor prep.  Approximately 15 cm proximal to what appeared to be the ileocolonic anastomosis, evidence of ulcerations and inflammation of the small bowel.  Pathology showed ileal mucosa with slight hyperemia, negative for active inflammation, granulomas, chronic changes.   Flex sig 07/2023: -nonbleeding internal hemorrhoids -patent side to side ileo colonic anastomosis, characterized by inflmattion -normal TI -repeat in 5 years.   Medications   Current Outpatient Medications  Medication Sig Dispense Refill   albuterol  (VENTOLIN  HFA) 108 (90 Base) MCG/ACT inhaler Inhale 2 puffs into the lungs every 6 (six) hours as needed for wheezing or shortness of breath.     ARIPiprazole  (ABILIFY ) 10 MG tablet Take 10 mg by mouth in the morning.     Ascorbic Acid  (VITAMIN C ) 1000 MG tablet Take 1,000 mg by mouth in the morning.     atorvastatin  (LIPITOR) 20 MG tablet Take 20 mg by mouth at bedtime.     busPIRone  (BUSPAR ) 15 MG tablet Take 15 mg by mouth 3 (three) times daily.     cetirizine (ZYRTEC) 10 MG tablet Take 10 mg by mouth in the morning.     CINNAMON PO Take 2 tablets by mouth in the morning.     clomiPRAMINE (ANAFRANIL) 75 MG capsule Take 75 mg by mouth at bedtime.     clonazePAM (KLONOPIN) 0.5 MG tablet Take by mouth.     cyclobenzaprine  (FLEXERIL ) 10 MG tablet Take 10 mg by mouth 3 (three) times daily as needed for muscle spasms.  diphenoxylate -atropine  (LOMOTIL ) 2.5-0.025 MG tablet TAKE 1 TABLET ONCE TO TWICE DAILY FOR DIARRHEA. 60 tablet 3   DULoxetine (CYMBALTA) 60 MG capsule Take 120 mg by mouth daily.     estradiol (ESTRACE) 0.1 MG/GM vaginal cream Place 1 g vaginally 3 (three) times a week.     famotidine  (PEPCID ) 20 MG tablet Take 1 tablet (20 mg total) by mouth at bedtime. 90 tablet 2   furosemide  (LASIX ) 20 MG tablet Take 20 mg by mouth in the morning and at bedtime.     gabapentin  (NEURONTIN ) 300  MG capsule Take 300 mg by mouth 3 (three) times daily. (Patient taking differently: Take 300 mg by mouth 3 (three) times daily. 400 mg)     hydrOXYzine  (ATARAX ) 50 MG tablet Take 50-100 mg by mouth at bedtime as needed (sleep).     lamoTRIgine  (LAMICTAL ) 200 MG tablet Take 200 mg by mouth every evening.     loperamide  (IMODIUM ) 2 MG capsule TAKE 1 CAPSULE (2 MG TOTAL) BY MOUTH 2 (TWO) TIMES DAILY BEFORE A MEAL. 60 capsule 3   metFORMIN (GLUCOPHAGE) 500 MG tablet Take 500 mg by mouth 2 (two) times daily.     montelukast  (SINGULAIR ) 10 MG tablet Take 10 mg by mouth at bedtime.     Multiple Vitamin (MULTIVITAMIN WITH MINERALS) TABS tablet Take 1 tablet by mouth in the morning.     MYRBETRIQ  50 MG TB24 tablet Take 50 mg by mouth in the morning.     OMEGA 3 1200 MG CAPS Take 1,200 mg by mouth in the morning.     oxymetazoline  (AFRIN) 0.05 % nasal spray Place 1 spray into both nostrils 2 (two) times daily as needed for congestion.     pantoprazole  (PROTONIX ) 40 MG tablet TAKE 1 TABLET (40 MG TOTAL) BY MOUTH TWICE A DAY BEFORE MEALS 180 tablet 1   Phenylephrine -Acetaminophen  (SINUS + HEADACHE PO) Take 2 tablets by mouth daily as needed (sinus headaches).     SUMAtriptan  (IMITREX ) 100 MG tablet Take 100 mg by mouth every 2 (two) hours as needed for migraine. *TAKE ONE TABLET BY MOUTH AT ONSET OF MIGRAINE. IF MIGRAINE PERSISTS AFTER 2 HOURS, TAKE ONE ADDITIONAL TABLET. *DO NOT EXCEED MORE THAN 2 TABLETS IN A 24 HOUR PERIOD AND DO NOT USE NO MORE THAN 5 TABLETS WITHIN THE PERIOD OF 1 WEEK*     topiramate  (TOPAMAX ) 50 MG tablet Take 150 mg by mouth in the morning.     UNABLE TO FIND Apply topically as needed. Med Name: **HOME TENS UNIT: FOR LOWER CHRONIC BACK PAIN/BACK PROBLEMS      valACYclovir  (VALTREX ) 500 MG tablet Take 500 mg by mouth 2 (two) times daily as needed (outbreaks).     budesonide-formoterol  (SYMBICORT) 80-4.5 MCG/ACT inhaler Inhale 2 puffs into the lungs 2 (two) times daily. (Patient not  taking: Reported on 03/14/2024)     buprenorphine (BUTRANS) 10 MCG/HR PTWK Place 1 patch onto the skin every Sunday. (Patient not taking: Reported on 03/14/2024)     buPROPion  (WELLBUTRIN  SR) 150 MG 12 hr tablet Take 150 mg by mouth 2 (two) times daily. (Patient not taking: Reported on 03/14/2024)     traMADol  (ULTRAM ) 50 MG tablet Take 50 mg by mouth every 8 (eight) hours as needed.     traZODone  (DESYREL ) 50 MG tablet Take 50-150 mg by mouth at bedtime. (Patient not taking: Reported on 03/14/2024)     No current facility-administered medications for this visit.    Allergies   Allergies  as of 03/14/2024 - Review Complete 03/14/2024  Allergen Reaction Noted   Advair diskus [fluticasone-salmeterol] Anaphylaxis 10/20/2011   Other Hives 12/20/2015   Codeine Nausea And Vomiting 10/20/2011   Penicillins Rash 10/20/2011     Past Medical History   Past Medical History:  Diagnosis Date   Anxiety    Arthritis    Asthma    Back pain    Carpal tunnel syndrome    COPD (chronic obstructive pulmonary disease) (HCC)    Degenerative cervical disc    Depression    Diabetes mellitus without complication (HCC)    DJD (degenerative joint disease), lumbosacral    Fibromyalgia    GERD (gastroesophageal reflux disease)    Hypersomnia    IBS (irritable bowel syndrome)    Interstitial cystitis    Migraine    Migraines    Neuropathy    OCD (obsessive compulsive disorder)    PONV (postoperative nausea and vomiting)    PTSD (post-traumatic stress disorder)    Restless leg syndrome    Sleep apnea    reports resolved    Past Surgical History   Past Surgical History:  Procedure Laterality Date   ABDOMINAL HYSTERECTOMY     ABDOMINAL SURGERY     BALLOON DILATION N/A 04/18/2021   Procedure: BALLOON DILATION;  Surgeon: Cindie Carlin POUR, DO;  [facility];  Service: Endoscopy;  Laterality: N/A;   BIOPSY  04/18/2021   Procedure: BIOPSY;  Surgeon: Cindie Carlin POUR, DO;  [facility];  Service: Endoscopy;;   BIOPSY  04/23/2023   Procedure: BIOPSY;  Surgeon: Cindie Carlin POUR, DO;  [facility]: [facility];  Service: Endoscopy;;   CATARACT EXTRACTION W/PHACO Right 11/10/2014   Procedure: CATARACT EXTRACTION PHACO AND INTRAOCULAR LENS PLACEMENT (IOC);  Surgeon: Oneil Platts, MD;  Location: [facility];  Service: Ophthalmology;  Laterality: Right;  CDE 2.04   CATARACT EXTRACTION W/PHACO Left 11/24/2014   Procedure: CATARACT EXTRACTION PHACO AND INTRAOCULAR LENS PLACEMENT (IOC);  Surgeon: Oneil Platts, MD;  Location: [facility];  Service: Ophthalmology;  Laterality: Left;  CDE:1.04   COLONOSCOPY WITH PROPOFOL  N/A 01/30/2022   Procedure: COLONOSCOPY WITH PROPOFOL ;  Surgeon: Cindie Carlin POUR, DO;  [facility]: [facility];  Service: Endoscopy;  Laterality: N/A;  11:30am, asa 3   ESOPHAGOGASTRODUODENOSCOPY  09/2015   Dr. Gunnar Daniels, [facility]: esopahgeal lumen appears dilated and diffusely coated with old food. s/p brushing/bx and dilation. Benign squamous mucosal showing surface mucosal necrosis and focal hyperkeratosis. no fungal elements or EOE   ESOPHAGOGASTRODUODENOSCOPY (EGD) WITH PROPOFOL  N/A 04/18/2021   Procedure: ESOPHAGOGASTRODUODENOSCOPY (EGD) WITH PROPOFOL ;  Surgeon: Cindie Carlin POUR, DO;  [facility];  Service: Endoscopy;  Laterality: N/A;   EXPLORATORY LAPAROTOMY     x3   FLEXIBLE SIGMOIDOSCOPY  04/18/2021   Procedure: FLEXIBLE SIGMOIDOSCOPY;  Surgeon: Cindie Carlin POUR, DO;  [facility];  Service: Endoscopy;;   FLEXIBLE SIGMOIDOSCOPY N/A 04/23/2023   Procedure: FLEXIBLE SIGMOIDOSCOPY;  Surgeon: Cindie Carlin POUR, DO;  [facility];  Service: Endoscopy;  Laterality: N/A;  1:00 pm, asa 3   FLEXIBLE SIGMOIDOSCOPY N/A 07/09/2023   Procedure: KINGSTON SIDE;  Surgeon: Cindie Carlin POUR, DO;  [facility];  Service: Endoscopy;  Laterality: N/A;  800am, asa 3   interstim medical device implanted     in lower  back-to control bladder   MOUTH SURGERY     PARTIAL COLECTOMY N/A 04/10/2022   Procedure: TOTAL COLECTOMY;  Surgeon:  Kallie Manuelita BROCKS, MD;  Location: [facility];  Service: General;  Laterality: N/A;   SUBMUCOSAL TATTOO INJECTION  01/30/2022   Procedure: SUBMUCOSAL TATTOO INJECTION;  Surgeon: Cindie Carlin POUR, DO;  [facility];  Service: Endoscopy;;    Past Family History   Family History  Adopted: Yes    Past Social History   Social History   Socioeconomic History   Marital status: Divorced    Spouse name: Not on file   Number of children: Not on file   Years of education: Not on file   Highest education level: Not on file  Occupational History   Not on file  Tobacco Use   Smoking status: Every Day    Current packs/day: 0.50    Average packs/day: 0.5 packs/day for 25.0 years (12.5 ttl pk-yrs)    Types: Cigarettes   Smokeless tobacco: Never  Vaping Use   Vaping status: Former  Substance and Sexual Activity   Alcohol  use: Yes    Comment: occ   Drug use: No   Sexual activity: Yes    Birth control/protection: Surgical    Comment: hyst  Other Topics Concern   Not on file  Social History Narrative   Not on file   Social Drivers of Health   Tobacco Use: High Risk (03/14/2024)   Patient History    Smoking Tobacco Use: Every Day    Smokeless Tobacco Use: Never    Passive Exposure: Not on file  Financial Resource Strain: Low Risk (09/06/2022)   Received from [facility]   Overall Financial Resource Strain (CARDIA)    Difficulty of Paying Living Expenses: Not hard at all  Food Insecurity: Low Risk (01/09/2024)   Received from [facility]   Epic    Within the past 12 months, you worried that your food would run out before you got money to buy more: Never true    Within the past 12 months, the food you bought just didn't last and you didn't have money to get more. : Never true  Transportation Needs: No Transportation Needs (01/09/2024)   Received from  [employer]    In the past 12 months, has lack of reliable transportation kept you from medical appointments, meetings, work or from getting things needed for daily living? : No  Physical Activity: Not on file  Stress: Not on file  Social Connections: Not on file  Intimate Partner Violence: Not At Risk (04/16/2022)   Humiliation, Afraid, Rape, and Kick questionnaire    Fear of Current or Ex-Partner: No    Emotionally Abused: No    Physically Abused: No    Sexually Abused: No  Depression (PHQ2-9): Not on file  Alcohol  Screen: Not on file  Housing: Low Risk (01/09/2024)   Received from [facility]   Epic    What is your living situation today?: I have a steady place to live    Think about the place you live. Do you have problems with any of the following? Choose all that apply:: None/None on this list  Utilities: Low Risk (01/09/2024)   Received from [facility]   Utilities    In the past 12 months has the electric, gas, oil, or water company threatened to shut off services in your home? : No  Health Literacy: Not on file    Review of Systems   General: Negative for anorexia, weight loss, fever, chills, fatigue, weakness. ENT: Negative for hoarseness, difficulty swallowing , nasal congestion.  CV: Negative for chest pain, angina, palpitations, dyspnea on exertion, peripheral edema.  Respiratory: Negative for dyspnea at rest, dyspnea on exertion, cough, sputum, wheezing.  GI: See history of present illness. GU:  Negative for dysuria, hematuria, urinary incontinence, urinary frequency, nocturnal urination.  Endo: Negative for unusual weight change.     Physical Exam   BP 100/61 (BP Location: Right Arm, Patient Position: Sitting, Cuff Size: Large)   Pulse 99   Temp 98.3 F (36.8 C) (Temporal)   Ht 5' 4 (1.626 m)   Wt 197 lb 12.8 oz (89.7 kg)   BMI 33.95 kg/m    General: Well-nourished, well-developed in no acute distress.  Eyes: No icterus. Mouth:  Oropharyngeal mucosa moist and pink   Lungs: Clear to auscultation bilaterally.  Heart: Regular rate and rhythm, no murmurs rubs or gallops.  Abdomen: Bowel sounds are normal, nontender, nondistended, no hepatosplenomegaly or masses,  no abdominal bruits or hernia , no rebound or guarding.  Rectal: not performed Extremities: No lower extremity edema. No clubbing or deformities. Neuro: Alert and oriented x 4   Skin: Warm and dry, no jaundice.   Psych: Alert and cooperative, normal mood and affect.  Labs   Lab Results  Component Value Date   NA 137 07/05/2023   CL 103 07/05/2023   K 3.6 07/05/2023   CO2 21 (L) 07/05/2023   BUN 11 07/05/2023   CREATININE 0.89 07/05/2023   GFRNONAA >60 07/05/2023   CALCIUM  9.2 07/05/2023   PHOS 2.9 04/19/2022   ALBUMIN 3.4 (L) 04/16/2022   GLUCOSE 88 07/05/2023   Lab Results  Component Value Date   ALT 21 04/16/2022   AST 15 04/16/2022   ALKPHOS 75 04/16/2022   BILITOT 0.8 04/16/2022   Lab Results  Component Value Date   WBC 8.2 12/18/2022   HGB 13.7 12/18/2022   HCT 40.6 12/18/2022   MCV 89.8 12/18/2022   PLT 267 12/18/2022    Imaging Studies   No results found.  Assessment/Plan:    Assessment & Plan Chronic diarrhea after colon resection Chronic diarrhea managed with Lomotil  and Imodium   - Continue Lomotil  and Imodium  as needed, up to twice daily. - Monitor stool consistency and frequency. - Follow-up in six months    Gastroesophageal reflux disease GERD managed with pantoprazole  and famotidine . Symptoms generally controlled with occasional breakthrough. - Continue pantoprazole  and famotidine  as prescribed.   Increased intestinal gas Increased flatulence without dietary changes. No significant pain or bloating.  . - Try Gas-X and monitor effectiveness. - Consider dietary modifications by reducing high FODMAP foods. - If Gas-X ineffective, consider probiotic or antibiotic trial for SIBO       Sonny RAMAN. Ezzard, MHS,  PA-C [facility]

## 2024-03-14 NOTE — Patient Instructions (Signed)
 I have refilled your medications, pantoprazole , famotidine , lomotil , loperamide .  Try gas x but let me know if your gas does not improve.  Continue to monitor for changes in your bowels. If you have constipation, hold your lomotil  and loperamide .  Review list of food below, if you eat one several times per week, back off to see if your gas improves.   Return visit in six months. Reach out with any questions or concerns.   Foods to reduce that can increase gas Fruits Fresh, dried, and juiced forms of apple, pear, watermelon, peach, plum, cherries, apricots, blackberries, boysenberries, figs, nectarines, and mango. Avocado. Vegetables Chicory root, artichoke, asparagus, cabbage, snow peas, Brussels sprouts, broccoli, sugar snap peas, mushrooms, celery, and cauliflower. Onions, garlic, leeks, and the white part of scallions. Grains Wheat, including kamut, durum, and semolina. Barley and bulgur. Couscous. Wheat-based cereals. Wheat noodles, bread, crackers, and pastries. Meats and other proteins Fried or fatty meat. Sausage. Cashews and pistachios. Soybeans, baked beans, black beans, chickpeas, kidney beans, fava beans, navy beans, lentils, black-eyed peas, and split peas. Dairy Milk, yogurt, ice cream, and soft cheese. Cream and sour cream. Milk-based sauces. Custard. Buttermilk. Soy milk. Seasoning and other foods Any sugar-free gum or candy. Foods that contain artificial sweeteners such as sorbitol, mannitol, isomalt, or xylitol. Foods that contain honey, high-fructose corn syrup, or agave. Bouillon, vegetable stock, beef stock, and chicken stock. Garlic and onion powder. Condiments made with onion, such as hummus, chutney, pickles, relish, salad dressing, and salsa. Tomato paste. Beverages Chicory-based drinks. Coffee substitutes. Chamomile tea. Fennel tea. Sweet or fortified wines such as port or sherry. Diet soft drinks made with isomalt, mannitol, maltitol, sorbitol, or xylitol. Apple,  pear, and mango juice. Juices with high-fructose corn syrup. The items listed above may not be a complete list of foods and beverages you should avoid. Contact a dietitian for more information.

## 2024-04-11 ENCOUNTER — Other Ambulatory Visit: Payer: Self-pay

## 2024-04-11 DIAGNOSIS — Z122 Encounter for screening for malignant neoplasm of respiratory organs: Secondary | ICD-10-CM

## 2024-04-11 DIAGNOSIS — Z87891 Personal history of nicotine dependence: Secondary | ICD-10-CM

## 2024-04-11 DIAGNOSIS — F17210 Nicotine dependence, cigarettes, uncomplicated: Secondary | ICD-10-CM
